# Patient Record
Sex: Female | Born: 1945
Health system: Southern US, Community
[De-identification: ages and names within clinical notes are randomized; demographics above are authoritative.]

## PROBLEM LIST (undated history)

## (undated) DIAGNOSIS — Z9289 Personal history of other medical treatment: Secondary | ICD-10-CM

## (undated) DIAGNOSIS — J189 Pneumonia, unspecified organism: Secondary | ICD-10-CM

## (undated) DIAGNOSIS — G43909 Migraine, unspecified, not intractable, without status migrainosus: Secondary | ICD-10-CM

## (undated) DIAGNOSIS — G8929 Other chronic pain: Secondary | ICD-10-CM

## (undated) DIAGNOSIS — F419 Anxiety disorder, unspecified: Secondary | ICD-10-CM

## (undated) DIAGNOSIS — K5900 Constipation, unspecified: Secondary | ICD-10-CM

## (undated) DIAGNOSIS — K219 Gastro-esophageal reflux disease without esophagitis: Secondary | ICD-10-CM

## (undated) HISTORY — PX: ABDOMINAL WALL MESH  REMOVAL: SHX1116

## (undated) HISTORY — PX: TONSILLECTOMY: SUR1361

## (undated) HISTORY — PX: CATARACT EXTRACTION W/ INTRAOCULAR LENS  IMPLANT, BILATERAL: SHX1307

## (undated) HISTORY — PX: OOPHORECTOMY: SHX86

## (undated) HISTORY — DX: Gastro-esophageal reflux disease without esophagitis: K21.9

## (undated) HISTORY — PX: COLONOSCOPY: SHX174

## (undated) HISTORY — PX: BLADDER SURGERY: SHX569

## (undated) HISTORY — PX: APPENDECTOMY: SHX54

## (undated) HISTORY — PX: SHOULDER SURGERY: SHX246

---

## 1998-08-02 ENCOUNTER — Emergency Department (HOSPITAL_COMMUNITY): Admission: EM | Admit: 1998-08-02 | Discharge: 1998-08-02 | Payer: Self-pay | Admitting: Emergency Medicine

## 1998-12-07 ENCOUNTER — Other Ambulatory Visit: Admission: RE | Admit: 1998-12-07 | Discharge: 1998-12-07 | Payer: Self-pay | Admitting: Obstetrics and Gynecology

## 1999-10-06 ENCOUNTER — Encounter (INDEPENDENT_AMBULATORY_CARE_PROVIDER_SITE_OTHER): Payer: Self-pay | Admitting: Specialist

## 1999-10-06 ENCOUNTER — Ambulatory Visit (HOSPITAL_COMMUNITY): Admission: RE | Admit: 1999-10-06 | Discharge: 1999-10-06 | Payer: Self-pay | Admitting: Gastroenterology

## 1999-12-07 ENCOUNTER — Emergency Department (HOSPITAL_COMMUNITY): Admission: EM | Admit: 1999-12-07 | Discharge: 1999-12-07 | Payer: Self-pay | Admitting: Emergency Medicine

## 1999-12-09 ENCOUNTER — Emergency Department (HOSPITAL_COMMUNITY): Admission: EM | Admit: 1999-12-09 | Discharge: 1999-12-09 | Payer: Self-pay | Admitting: Emergency Medicine

## 2000-01-30 ENCOUNTER — Other Ambulatory Visit: Admission: RE | Admit: 2000-01-30 | Discharge: 2000-01-30 | Payer: Self-pay | Admitting: Obstetrics and Gynecology

## 2001-05-05 ENCOUNTER — Other Ambulatory Visit: Admission: RE | Admit: 2001-05-05 | Discharge: 2001-05-05 | Payer: Self-pay | Admitting: Obstetrics and Gynecology

## 2002-09-07 ENCOUNTER — Other Ambulatory Visit: Admission: RE | Admit: 2002-09-07 | Discharge: 2002-09-07 | Payer: Self-pay | Admitting: Obstetrics and Gynecology

## 2003-12-14 ENCOUNTER — Other Ambulatory Visit: Admission: RE | Admit: 2003-12-14 | Discharge: 2003-12-14 | Payer: Self-pay | Admitting: Obstetrics and Gynecology

## 2005-04-20 ENCOUNTER — Ambulatory Visit (HOSPITAL_BASED_OUTPATIENT_CLINIC_OR_DEPARTMENT_OTHER): Admission: RE | Admit: 2005-04-20 | Discharge: 2005-04-20 | Payer: Self-pay | Admitting: Orthopedic Surgery

## 2005-04-20 ENCOUNTER — Ambulatory Visit (HOSPITAL_COMMUNITY): Admission: RE | Admit: 2005-04-20 | Discharge: 2005-04-20 | Payer: Self-pay | Admitting: Orthopedic Surgery

## 2008-05-19 ENCOUNTER — Ambulatory Visit (HOSPITAL_BASED_OUTPATIENT_CLINIC_OR_DEPARTMENT_OTHER): Admission: RE | Admit: 2008-05-19 | Discharge: 2008-05-19 | Payer: Self-pay | Admitting: Orthopedic Surgery

## 2009-04-02 ENCOUNTER — Inpatient Hospital Stay (HOSPITAL_COMMUNITY): Admission: EM | Admit: 2009-04-02 | Discharge: 2009-04-05 | Payer: Self-pay | Admitting: Emergency Medicine

## 2009-05-26 ENCOUNTER — Other Ambulatory Visit: Admission: RE | Admit: 2009-05-26 | Discharge: 2009-05-26 | Payer: Self-pay | Admitting: Family Medicine

## 2010-02-28 ENCOUNTER — Ambulatory Visit (HOSPITAL_COMMUNITY)
Admission: RE | Admit: 2010-02-28 | Discharge: 2010-03-01 | Payer: Self-pay | Source: Home / Self Care | Admitting: Obstetrics and Gynecology

## 2010-02-28 ENCOUNTER — Encounter (INDEPENDENT_AMBULATORY_CARE_PROVIDER_SITE_OTHER): Payer: Self-pay | Admitting: Obstetrics and Gynecology

## 2010-05-29 ENCOUNTER — Encounter: Admission: RE | Admit: 2010-05-29 | Discharge: 2010-05-29 | Payer: Self-pay | Admitting: Family Medicine

## 2010-07-19 LAB — CBC
HCT: 41.1 % (ref 36.0–46.0)
Hemoglobin: 13.1 g/dL (ref 12.0–15.0)
MCH: 28.7 pg (ref 26.0–34.0)
MCHC: 31.9 g/dL (ref 30.0–36.0)
MCV: 90.1 fL (ref 78.0–100.0)
Platelets: 167 10*3/uL (ref 150–400)
RBC: 4.56 MIL/uL (ref 3.87–5.11)
RDW: 15.3 % (ref 11.5–15.5)
WBC: 5.2 10*3/uL (ref 4.0–10.5)

## 2010-07-25 ENCOUNTER — Ambulatory Visit (HOSPITAL_COMMUNITY)
Admission: RE | Admit: 2010-07-25 | Discharge: 2010-07-26 | Payer: Self-pay | Source: Home / Self Care | Attending: Obstetrics and Gynecology | Admitting: Obstetrics and Gynecology

## 2010-07-31 ENCOUNTER — Ambulatory Visit (HOSPITAL_COMMUNITY)
Admission: RE | Admit: 2010-07-31 | Discharge: 2010-07-31 | Payer: Self-pay | Source: Home / Self Care | Attending: Obstetrics and Gynecology | Admitting: Obstetrics and Gynecology

## 2010-07-31 LAB — CBC
HCT: 31.2 % — ABNORMAL LOW (ref 36.0–46.0)
HCT: 22.2 % — ABNORMAL LOW (ref 36.0–46.0)
HCT: 21.6 % — ABNORMAL LOW (ref 36.0–46.0)
Hemoglobin: 10 g/dL — ABNORMAL LOW (ref 12.0–15.0)
Hemoglobin: 7.2 g/dL — ABNORMAL LOW (ref 12.0–15.0)
Hemoglobin: 7.1 g/dL — ABNORMAL LOW (ref 12.0–15.0)
MCH: 29.1 pg (ref 26.0–34.0)
MCH: 29 pg (ref 26.0–34.0)
MCH: 29.6 pg (ref 26.0–34.0)
MCHC: 32.1 g/dL (ref 30.0–36.0)
MCHC: 32.4 g/dL (ref 30.0–36.0)
MCHC: 32.9 g/dL (ref 30.0–36.0)
MCV: 90.7 fL (ref 78.0–100.0)
MCV: 89.5 fL (ref 78.0–100.0)
MCV: 90 fL (ref 78.0–100.0)
Platelets: 152 10*3/uL (ref 150–400)
Platelets: 147 10*3/uL — ABNORMAL LOW (ref 150–400)
Platelets: 150 10*3/uL (ref 150–400)
RBC: 3.44 MIL/uL — ABNORMAL LOW (ref 3.87–5.11)
RBC: 2.48 MIL/uL — ABNORMAL LOW (ref 3.87–5.11)
RBC: 2.4 MIL/uL — ABNORMAL LOW (ref 3.87–5.11)
RDW: 14.7 % (ref 11.5–15.5)
RDW: 14.8 % (ref 11.5–15.5)
RDW: 14.7 % (ref 11.5–15.5)
WBC: 12.3 10*3/uL — ABNORMAL HIGH (ref 4.0–10.5)
WBC: 7.3 10*3/uL (ref 4.0–10.5)
WBC: 8.8 10*3/uL (ref 4.0–10.5)

## 2010-08-02 LAB — CREATININE, SERUM
Creatinine, Ser: 0.72 mg/dL (ref 0.4–1.2)
GFR calc Af Amer: >60 mL/min (ref >60)
GFR calc non Af Amer: >60 mL/min (ref >60)

## 2010-08-02 LAB — BLOOD GAS, ARTERIAL
Acid-base deficit: 2.8 mmol/L — ABNORMAL HIGH (ref 0.0–2.0)
Bicarbonate: 21.4 mEq/L (ref 20.0–24.0)
Drawn by: 136
FIO2: 0.21 %
TCO2: 22.6 mmol/L (ref 0–100)
pCO2 arterial: 37.7 mmHg (ref 35.0–45.0)
pH, Arterial: 7.373 (ref 7.350–7.400)
pO2, Arterial: 60.9 mmHg — ABNORMAL LOW (ref 80.0–100.0)

## 2010-08-02 LAB — BUN: BUN: 12 mg/dL (ref 6–23)

## 2010-08-03 ENCOUNTER — Inpatient Hospital Stay (HOSPITAL_COMMUNITY)
Admission: AD | Admit: 2010-08-03 | Discharge: 2010-08-03 | Payer: Self-pay | Source: Home / Self Care | Attending: Obstetrics and Gynecology | Admitting: Obstetrics and Gynecology

## 2010-08-10 LAB — SURGICAL PCR SCREEN
MRSA, PCR: NEGATIVE
Staphylococcus aureus: POSITIVE — AB

## 2010-08-18 NOTE — H&P (Signed)
[name], [name]                ACCOUNT NO.:  [id number]  MEDICAL RECORD NO.:  [id number]          PATIENT TYPE:  OBV  LOCATION:  9310                          FACILITY:  [facility]  PHYSICIAN:  Juluis Mire, M.D.   DATE OF BIRTH:  Apr 10, 1946  DATE OF ADMISSION:  07/25/2010 DATE OF DISCHARGE:                             HISTORY & PHYSICAL   HISTORY OF PRESENT ILLNESS:  The patient is a 65 year old postmenopausal patient.  The patient presents for evaluation of pelvic pain and discomfort after previous surgery.  We are going to do laparoscopy to rule out pelvic adhesions.  We are also going to cut the mesh arms from the uphold system and inject the area with Marcaine and dexamethasone.  In relation to the present admission, the patient underwent LAVH with a left salpingo-oophorectomy, anterior repair using the Uphold system, posterior repair and mid urethral sling in August of this year.  She has had problems with worsening pelvic pain, discomfort, and dyspareunia and has been undergoing pelvic rehab with physical therapist also had GI evaluation, all of which were negative.  She has continued to have issues and is becoming progressive on evaluation.  She does have evidence of tenderness at both sacrospinous areas as well as the vaginal cuff.  Therefore, we are going to proceed with the above-noted surgery.  ALLERGIES:  In terms of allergies, she is allergic to SULFA DRUGS.  MEDICATIONS:  Imipramine, Prilosec, and trazodone.  PAST MEDICAL HISTORY:  Usual childhood disease without any significant sequelae.  Does have a history of esophageal reflux disorder.  PAST SURGICAL HISTORY:  The patient has had previous tonsillectomy and adenoidectomy in 1949.  In 1985, she had bilateral tubal ligation.  In 1988, she had laparoscopy with lysis of adhesions.  In 1989, she had an appendectomy with removal of right tube and ovary due to adhesions and then she underwent the above-noted surgery.   She has had one vaginal delivery.  FAMILY HISTORY:  Noncontributory.  SOCIAL HISTORY:  Reveals no tobacco or alcohol use.  REVIEW OF SYSTEMS:  Noncontributory.  PHYSICAL EXAMINATION:  VITAL SIGNS:  The patient is afebrile, stable vital signs. HEENT:  The patient is normocephalic.  Pupils equal and react to light accommodation.  Extraocular movements are intact.  Sclerae and conjunctivae are clear.  Oropharynx clear. NECK:  Without thyromegaly. BREASTS:  Not examined. LUNGS:  Clear. CARDIOVASCULAR:  Regular rate.  No murmurs or gallops. ABDOMEN:  Reveal diffuse suprapubic tenderness.  No mass, organomegaly, or tenderness. PELVIC:  Normal external genitalia.  Vaginal mucosa is clear.  Cuff is intact.  She has good support, no evidence of mesh or erosions, does have cuff tenderness at the top and on both sides next to the sacrospinous ligaments.  The arms of the Uphold system feel a bit tight. EXTREMITIES:  Trace edema. NEUROLOGICAL:  Grossly within limits.  IMPRESSION:  Continued pelvic pain and discomfort with associated dyspareunia, possibly secondary to mesh system.  PLAN OF MANAGEMENT:  We are ago with a laparoscope to rule out adhesions.  We are going to clip both arms of the Uphold to relieve the  tension.  We will inject the area with dexamethasone and Marcaine to see if we give her relief from her discomfort.  The nature of procedure and risks have been discussed including the potential risk of continued pain, discomfort, dyspareunia, the risk of infection.  Risk of hemorrhage that could require transfusion, risk of AIDS, or hepatitis. Risk of injury to adjacent organs requiring further exploratory surgery. Risk of deep venous thrombosis and pulmonary embolus.  The patient does understand potential risks and complications.     Juluis Mire, M.D.     JSM/MEDQ  D:  07/25/2010  T:  07/25/2010  Job:  [id number]  Electronically Signed by Richardean Chimera M.D. on  08/18/2010 01:34:35 PM

## 2010-08-18 NOTE — Discharge Summary (Signed)
  [name], [name]                ACCOUNT NO.:  [id number]  MEDICAL RECORD NO.:  [id number]          PATIENT TYPE:  OIB  LOCATION:  9310                          FACILITY:  [facility]  PHYSICIAN:  Juluis Mire, M.D.   DATE OF BIRTH:  [birth year]  DATE OF ADMISSION:  07/25/2010 DATE OF DISCHARGE:  07/26/2010                              DISCHARGE SUMMARY   ADMITTING DIAGNOSIS:  Pelvic pain secondary to paravaginal adhesions with associated dyspareunia.  POSTOPERATIVE DIAGNOSIS:  Pelvic pain secondary to paravaginal adhesions with associated dyspareunia.  OPERATIVE PROCEDURE:  Open laparoscopy.  Release of vaginal scarring. Division of the uphold arm on the right side.  Injection of the vaginal cuff as well as the sacrospinous ligaments with dissolution of dexamethasone and Marcaine.  For complete history and physical, see dictated note, course in the hospital, please see dictated operative note.  We did have quite a bit of blood loss due to pain.  The patient was watched overnight.  Her hemoglobin the next one was 7.2.  We repeated that afternoon, it was 7.1.  She was stable, ambulating without difficulty, tolerating her diet.  She was voiding without difficulty, had minimal bleeding at that time and her exam was benign.  She will be discharged to home.  In terms of complication, noted above.  The patient discharged to home in stable condition.  DISPOSITION:  The patient avoid heavy lifting, vaginal inserts, or driving a car.  She is to call with signs of infection, nausea, vomiting, active vaginal bleeding or increasing pain.  Also instructed if signs and symptoms of deep venous thrombosis and pulmonary embolus. Discharged on Tylox as needed for pain and iron sulfate supplementation. Reassess in the office early next week.     Juluis Mire, M.D.     JSM/MEDQ  D:  07/26/2010  T:  07/27/2010  Job:  [id number]  Electronically Signed by Richardean Chimera M.D. on 08/18/2010  01:34:33 PM

## 2010-08-18 NOTE — Op Note (Signed)
[name], [name]                ACCOUNT NO.:  [id number]  MEDICAL RECORD NO.:  [id number]          PATIENT TYPE:  OBV  LOCATION:  9310                          FACILITY:  [facility]  PHYSICIAN:  Juluis Mire, M.D.   DATE OF BIRTH:  Apr 04, 1946  DATE OF PROCEDURE:  07/25/2010 DATE OF DISCHARGE:                              OPERATIVE REPORT   PREOPERATIVE DIAGNOSIS:  Pelvic pain, felt to be secondary to pelvic adhesions or scarring around the uphold mesh system.  POSTOPERATIVE DIAGNOSIS:  Pelvic pain, felt to be secondary to pelvic adhesions or scarring around the uphold mesh system.  OPERATIVE PROCEDURE:  Open laparoscopy.  We then released the right arm of the uphold mesh system.  Subsequently injected the vaginal cuff with a combination of Marcaine with dexamethasone and injected both sacrospinous ligaments with the same solution.  Also cystoscopy.  SURGEON:  Juluis Mire, M.D.  ANESTHESIA:  General endotracheal.  ESTIMATED BLOOD LOSS:  800 mL to 1000 mL.  PACKS AND DRAINS:  None.  INJECTABLES PLACED:  None.  COMPLICATIONS:  None.  INDICATIONS:  Dictated history and physical.  PROCEDURE:  The patient was taken to OR and placed in supine position. After satisfactory level of general endotracheal anesthesia was obtained, the patient was placed in the dorsal lithotomy position using the Allen stirrups.  At this point in time, the abdomen, perineum, and vagina prepped out with Betadine.  Foley was placed to straight drain. The patient was then draped in sterile field.  A subumbilical incision made with a knife and extended through subcutaneous tissue.  Fascia identified, entered sharply, and incision fashioned laterally. Peritoneum was identified, entered with blunt finger pressure.  The open laparoscopic trocar was put in place and secured.  The abdomen was insufflated with carbon dioxide.  Laparoscope was introduced.  There was no evidence of injury to adjacent organs.   A 5-mm trocar was put in place in suprapubic area.  Visualization revealed the cuff to be completely clear.  There was no adhesions from the bowel or anything to the pelvic cuff or sidewalls.  At this point in time, the laparoscope was removed, abdomen was deflated with carbon dioxide.  The patient's legs were repositioned.  We decided to go vaginally.  Vaginally revealed tight bands on each side that felt to represent the arms of the uphold system.  Also, there was a tight introitus.  We were able to manually reduce the constriction at the introitus.  Internally, we first went to the patient's right side assumed to be the tightest.  Using Kochers, we were able to grab the band.  Using a combination of knife and scissors and dissection, we were eventually able to clip the band and tube, which probably represents some of the mesh, although we never could identify the mesh specifically.  We then ran into some brisk bleeding coming from that side.  Dr. Henderson Cloud was called in.  We were able to eventually identify the bleeders.  Using sutures of 2-0 Vicryl in a figure-of-eight fashion, we were able to close the vaginal defect that we had developed in order to take  down the band and this did bring about hemostasis.  She lost probably a total between [phone] mL before able to obtain hemostasis.  We closely visualized the area and it was hemostatically intact.  At this point in time using a pudendal kit, we injected a combination of Marcaine and dexamethasone into the sacrospinous ligaments on both sides.  Also, we were able to inject at the top of the vaginal cuff and we infiltrate also under the bladder area.  The introitus, the skin had separated aside from the vaginal area where we had manually reduce the scarring.  We resutured that with interrupted sutures of 3-0 Rapide.  The patient was then given indigo carmine.  Cystoscopy was performed. There was no evidence of injury to the bladder.   Blue streams of urine were noted to be coming from both ureteral orifices.  The cystoscope was then removed.  Bladder was re-emptied.  We went ahead and re-visualized the vaginal cuff, it was intact.  On rectal exam, we could not feel any mass forming, there was no holes in the colon.  We went back laparoscopically, visualized at all no bleeding or developing hematoma.  The abdomen was deflated with carbon dioxide.  All trocars were removed.  Subumbilical fascia closed with figure-of-eight of 0 Vicryl.  Skin was closed with interrupted subcuticular of  4-0 Vicryl and the suprapubic incision was closed with Dermabond.  We went back vaginally, re-visualized the vaginal cuff.  There was no active bleeding.  Rectal exam again was unremarkable.  There was no hematoma formation that we could detect.  The patient was hemodynamically stable.  At this point in time, the patient was taken out of the dorsal supine position.  Once alert and extubated, transferred to recovery room in good condition.  Sponge, instrument, and needle count reported as correct by circulating nurse x2.     Juluis Mire, M.D.     JSM/MEDQ  D:  07/25/2010  T:  07/26/2010  Job:  [id number]  Electronically Signed by Richardean Chimera M.D. on 08/18/2010 01:34:37 PM

## 2010-09-06 ENCOUNTER — Ambulatory Visit: Payer: Self-pay | Admitting: Cardiology

## 2010-09-29 LAB — COMPREHENSIVE METABOLIC PANEL
ALT: 24 U/L (ref 0–35)
AST: 30 U/L (ref 0–37)
Albumin: 4.7 g/dL (ref 3.5–5.2)
Alkaline Phosphatase: 95 U/L (ref 39–117)
BUN: 7 mg/dL (ref 6–23)
CO2: 27 mEq/L (ref 19–32)
Calcium: 9.6 mg/dL (ref 8.4–10.5)
Chloride: 103 mEq/L (ref 96–112)
Creatinine, Ser: 0.74 mg/dL (ref 0.4–1.2)
GFR calc Af Amer: >60 mL/min (ref >60)
GFR calc non Af Amer: >60 mL/min (ref >60)
Glucose, Bld: 90 mg/dL (ref 70–99)
Potassium: 3.9 mEq/L (ref 3.5–5.1)
Sodium: 135 mEq/L (ref 135–145)
Total Bilirubin: 0.5 mg/dL (ref 0.3–1.2)
Total Protein: 7.8 g/dL (ref 6.0–8.3)

## 2010-09-29 LAB — CBC
HCT: 29.9 % — ABNORMAL LOW (ref 36.0–46.0)
HCT: 40.7 % (ref 36.0–46.0)
Hemoglobin: 10.2 g/dL — ABNORMAL LOW (ref 12.0–15.0)
Hemoglobin: 13.7 g/dL (ref 12.0–15.0)
MCH: 31.6 pg (ref 26.0–34.0)
MCH: 31.3 pg (ref 26.0–34.0)
MCHC: 34 g/dL (ref 30.0–36.0)
MCHC: 33.6 g/dL (ref 30.0–36.0)
MCV: 93 fL (ref 78.0–100.0)
MCV: 93.2 fL (ref 78.0–100.0)
Platelets: 176 10*3/uL (ref 150–400)
Platelets: 215 10*3/uL (ref 150–400)
RBC: 3.22 MIL/uL — ABNORMAL LOW (ref 3.87–5.11)
RBC: 4.37 MIL/uL (ref 3.87–5.11)
RDW: 13.5 % (ref 11.5–15.5)
RDW: 13.1 % (ref 11.5–15.5)
WBC: 10.1 10*3/uL (ref 4.0–10.5)
WBC: 6.7 10*3/uL (ref 4.0–10.5)

## 2010-09-29 LAB — SURGICAL PCR SCREEN
MRSA, PCR: NEGATIVE
Staphylococcus aureus: POSITIVE — AB

## 2010-10-20 LAB — CBC
HCT: 39.3 % (ref 36.0–46.0)
HCT: 39.5 % (ref 36.0–46.0)
Hemoglobin: 13.3 g/dL (ref 12.0–15.0)
Hemoglobin: 13.5 g/dL (ref 12.0–15.0)
MCHC: 33.8 g/dL (ref 30.0–36.0)
MCHC: 34.2 g/dL (ref 30.0–36.0)
MCV: 91.1 fL (ref 78.0–100.0)
MCV: 91.4 fL (ref 78.0–100.0)
Platelets: 180 10*3/uL (ref 150–400)
Platelets: 198 10*3/uL (ref 150–400)
RBC: 4.3 MIL/uL (ref 3.87–5.11)
RBC: 4.33 MIL/uL (ref 3.87–5.11)
RDW: 14.5 % (ref 11.5–15.5)
RDW: 14.6 % (ref 11.5–15.5)
WBC: 7 10*3/uL (ref 4.0–10.5)
WBC: 8.3 10*3/uL (ref 4.0–10.5)

## 2010-10-20 LAB — COMPREHENSIVE METABOLIC PANEL
ALT: 24 U/L (ref 0–35)
AST: 36 U/L (ref 0–37)
Albumin: 4.3 g/dL (ref 3.5–5.2)
Alkaline Phosphatase: 114 U/L (ref 39–117)
BUN: 5 mg/dL — ABNORMAL LOW (ref 6–23)
CO2: 24 mEq/L (ref 19–32)
Calcium: 9.7 mg/dL (ref 8.4–10.5)
Chloride: 106 mEq/L (ref 96–112)
Creatinine, Ser: 0.7 mg/dL (ref 0.4–1.2)
GFR calc Af Amer: >60 mL/min (ref >60)
GFR calc non Af Amer: >60 mL/min (ref >60)
Glucose, Bld: 88 mg/dL (ref 70–99)
Potassium: 3.2 mEq/L — ABNORMAL LOW (ref 3.5–5.1)
Sodium: 139 mEq/L (ref 135–145)
Total Bilirubin: 0.7 mg/dL (ref 0.3–1.2)
Total Protein: 7.2 g/dL (ref 6.0–8.3)

## 2010-10-20 LAB — RAPID URINE DRUG SCREEN, HOSP PERFORMED
Amphetamines: NOT DETECTED
Barbiturates: NOT DETECTED
Benzodiazepines: POSITIVE — AB
Cocaine: NOT DETECTED
Opiates: POSITIVE — AB
Tetrahydrocannabinol: NOT DETECTED

## 2010-10-20 LAB — URINALYSIS, ROUTINE W REFLEX MICROSCOPIC
Bilirubin Urine: NEGATIVE
Glucose, UA: NEGATIVE mg/dL
Hgb urine dipstick: NEGATIVE
Ketones, ur: NEGATIVE mg/dL
Nitrite: NEGATIVE
Protein, ur: NEGATIVE mg/dL
Specific Gravity, Urine: 1.007 (ref 1.005–1.030)
Urobilinogen, UA: 0.2 mg/dL (ref 0.0–1.0)
pH: 5.5 (ref 5.0–8.0)

## 2010-10-20 LAB — BASIC METABOLIC PANEL
BUN: 2 mg/dL — ABNORMAL LOW (ref 6–23)
Chloride: 108 mEq/L (ref 96–112)
Potassium: 4 mEq/L (ref 3.5–5.1)

## 2010-10-20 LAB — LIPASE, BLOOD: Lipase: 23 U/L (ref 11–59)

## 2010-10-20 LAB — GLUCOSE, CAPILLARY: Glucose-Capillary: 88 mg/dL (ref 70–99)

## 2010-10-20 LAB — SYPHILIS: RPR W/REFLEX TO RPR TITER AND TREPONEMAL ANTIBODIES, TRADITIONAL SCREENING AND DIAGNOSIS ALGORITHM: RPR Ser Ql: NONREACTIVE

## 2010-10-20 LAB — CALCIUM: Calcium: 9.2 mg/dL (ref 8.4–10.5)

## 2010-10-20 LAB — DIFFERENTIAL
Basophils Absolute: 0 10*3/uL (ref 0.0–0.1)
Eosinophils Relative: 1 % (ref 0–5)
Lymphocytes Relative: 28 % (ref 12–46)
Neutro Abs: 4.5 10*3/uL (ref 1.7–7.7)

## 2010-10-20 LAB — T4, FREE: Free T4: 0.98 ng/dL (ref 0.80–1.80)

## 2010-10-20 LAB — MAGNESIUM: Magnesium: 2 mg/dL (ref 1.5–2.5)

## 2010-10-20 LAB — VITAMIN B12: Vitamin B-12: 499 pg/mL (ref 211–911)

## 2010-10-20 LAB — PHOSPHORUS: Phosphorus: 2.3 mg/dL (ref 2.3–4.6)

## 2010-10-20 LAB — URINE MICROSCOPIC-ADD ON

## 2010-10-20 LAB — ETHANOL: Alcohol, Ethyl (B): <5 mg/dL (ref 0–10)

## 2010-11-28 NOTE — Op Note (Signed)
[name], [name]                ACCOUNT NO.:  [id number]   MEDICAL RECORD NO.:  [id number]          PATIENT TYPE:  AMB   LOCATION:  DSC                          FACILITY:  [facility]   PHYSICIAN:  Harvie Junior, M.D.   DATE OF BIRTH:  August 30, 1945   DATE OF PROCEDURE:  05/19/2008  DATE OF DISCHARGE:                               OPERATIVE REPORT   PREOPERATIVE DIAGNOSIS:  Persistent shoulder pain in the left, status  post acromioplasty, distal clavicle resection with radiographic findings  of regrowth of the distal clavicle and persistent impingement.   POSTOPERATIVE DIAGNOSIS:  Persistent shoulder pain in the left, status  post acromioplasty, distal clavicle resection with radiographic findings  of regrowth of the distal clavicle and persistent impingement.   PRINCIPAL PROCEDURE:  1. Arthroscopic subacromial decompression with tension laterally.  2. Debridement of the subacromial space as well as the glenohumeral      joint arthroscopic.  3. Open distal clavicle resection.   SURGEON:  Harvie Junior, M.D.   ASSISTANT:  Marshia Ly, P.A.   ANESTHESIA:  General.   BRIEF HISTORY:  Ms. Hesser is a 65 year old female with long history of  having had left shoulder pain.  We treated conservatively for a period  of time, but ultimately she was taken to the operating room for  subacromial decompression and distal clavicle resection.  She did well  initially postoperatively, but then began having increasing pain.  She  had responded to injections in the area of the distal clavicle excision.  There was a small wisp of bone postoperatively, really not much, but  over the four-month postoperative this continued to be increase in size  and ultimately injection in this area seem to help.  She also had some  persistent lateral shoulder pain.  We had taken some x-rays of her neck  and had seen some spurring up in the neck and felt that this needed  evaluation with MRI, possibly EMG of the  upper extremity.  She is full  apprehensive about pursuing such an aggressive workup without  readjusting the shoulder as an initial treatment and so she was  ultimately taken to the operating room for open distal clavicle  resection and we had a look into the shoulder while we were there to  make sure there was nothing significant going on.  So, she was brought  to the operating room for that procedure.  We did have a long discussion  with her preoperatively and that her thought process was that she should  workup the neck at that time, but we did not think it was inappropriate  to go ahead and address the shoulder and if perhaps that relieved all of  her pain, then she would need to have the other workup.  My feeling is  that there is some percentage of each causing the pain and that this is  one way to sort out what the percentage is based on how much relief she  gets with the shoulder surgery.  She certainly had improvement with the  injection of numbing medicines, so we certainly  think this is the  appropriate course of action.  She was brought to the operating room for  this procedure.   PROCEDURE:  The patient was brought to the operating room.  After  adequate anesthesia was obtained with general anesthetic, the patient  was placed supine on the operating table.  The left shoulder was then  prepped and draped in the usual sterile fashion.  Following this, the  shoulder was examined under anesthesia and felt to be stable in all  direction.  At this point, the shoulder was prepped and draped in the  usual sterile fashion.  Routine arthroscopic examination which showed in  the glenohumeral joint there was some undersurface rotator cuff tear,  which was debrided with a suction shaver.  Nothing dramatic in this  area.  Attention was turned back to the subacromial space where a  subtotal bursectomy was performed.  There was a fair amount of  tenacious, aggressive scar tissue.  There  was some bands connecting the  under surface of the deltoid to the lateral rotator cuff and these were  taken down.  There were some issues relative to the lateral acromion and  so we went ahead and did a little bit of lateral acromioplasty and took  down thick bands that were attached to the lateral acromion.  This was  completed.  Attention turned over to the area of the distal clavicle and  certainly you could see the old, where the distal clavicle had been  performed, there was a little bit of hard area up in that area and this  was debrided minimally and then attention was turned out of the  arthroscopic portion of the case.  The shoulder was suctioned dry.  Attention was then turned to the open.  We made a small incision in  [employer], dissected down to the deltotrapezial fascia.  Opened this  longitudinally along the edge of the bone, took about 1 cm of the distal  clavicle and then all of the remaining scar and bone out of the  interval.  There was one large bony fragment, which could be seen on x-  ray, which was removed as well as a bunch of scar tissue.  We then  closed the deltotrapezial fascia after a thorough and copious  irrigation.  I then closed the skin.  Sterile compressive dressings were  applied.  The patient was taken to the recovery room and was noted to be  in a satisfactory condition.  Estimated blood loss for this procedure  was less than 25 mL.      Harvie Junior, M.D.  Electronically Signed     JLG/MEDQ  D:  05/19/2008  T:  05/20/2008  Job:  [id number]

## 2010-12-01 NOTE — Op Note (Signed)
NAMERAINI, TILEY                ACCOUNT NO.:  [id number]   MEDICAL RECORD NO.:  [id number]          PATIENT TYPE:  AMB   LOCATION:  DSC                          FACILITY:  [facility]   PHYSICIAN:  Harvie Junior, M.D.   DATE OF BIRTH:  Jan 26, 1946   DATE OF PROCEDURE:  04/20/2005  DATE OF DISCHARGE:                                 OPERATIVE REPORT   PREOPERATIVE DIAGNOSIS:  Medial femoral condylar pain as well as  patellofemoral problems with suspected medial plica.   POSTOPERATIVE DIAGNOSES:  1.  Osteochondral injury medial femoral condyle.  2.  Osteochondral injuries of patella.  3.  Large medial plica.   PRINCIPAL PROCEDURES:  1.  Debridement of chondromalacia medial femoral condyle.  2.  Debridement of chondromalacia of the patella.  3.  Debridement of medial side plica.   SURGEON:  Harvie Junior, M.D.   ASSISTANT:  Marshia Ly, P.A.   ANESTHESIA:  General anesthesia.   BRIEF HISTORY:  A 65 year old female with a long history of having a  significant twisting style injury.  She also was evaluated in the office and  felt to have significant injuries within the knee.  MRI was obtained which  showed some patellofemoral problems.  Injection therapy had helped but she  still persisted with significant medial side pain.  Therapy had helped but  she still persisted with significant medial side.  Ultimately she was taken  to the operating room for debridement of evaluation and debridement of these  problems.  Patient taken to the operating room.  After adequate anesthesia  obtained with general anesthetic, patient placed on the operating table.  Left leg was prepped and draped in the usual sterile fashion.  Following  this, routine arthroscopic examination of the knee revealed there as obvious  large medial shelf plica.  There was obvious grade III change of the  patella.  The patella was debrided back to a smooth and stable rim.  The  medial plica was debrided all the way back  to the capsular wall and the  attention was turned in the medial compartment where there was some grade II  change on the medial femoral condyle.  Posteriorly, there was a slightly  larger grade III lesion which appeared to have some evidence of healing.  ACL was evaluated and noted to be normal.  Lateral side normal.  Attention  was then turned back to the medial compartment where debridement was  undertaken from just north of the patella all the way down to the meniscus.  The entire medial wall was debrided of its plica and other scar related  tissue.  At this point, the knee was copiously  irrigated and suctioned dry.  The arthroscopic portals were closed with a  bandage.  A sterile compressive dressing was applied and the patient taken  to the recovery room and was noted to be in satisfactory condition.   ESTIMATED BLOOD LOSS:  None.      Harvie Junior, M.D.  Electronically Signed     JLG/MEDQ  D:  04/20/2005  T:  04/20/2005  Job:  [id number] 

## 2010-12-01 NOTE — Procedures (Signed)
[facility]  Patient:    [name], [name]                       MRN: [id number] Proc. Date: 10/06/99 Adm. Date:  [id number] Attending:  Rich Brave                           Procedure Report  DATE OF BIRTH:  [birth year]  PROCEDURE:  Upper endoscopy with biopsies.  ENDOSCOPIST:  Florencia Reasons, M.D.  ANESTHESIA:  INDICATIONS:  A 65 year old previously seen by another gastroenterologist who felt she had Barretts esophagus for surveillance.  She is maintained on Prevacid 30 g daily with good control of reflux symptoms.  FINDINGS:  Possible short segment Barretts esophagus versus irregular Z-line above small hiatal hernia.  DESCRIPTION OF PROCEDURE:  The patient provided written consent for the procedure. Sedation was fentanyl 75 mcg and Versed 8 mg without arrhythmias or desaturation. The Olympus small caliber adult video endoscope was passed under direct vision.  The vocal cords looked normal.  The esophagus was easily entered and had entirely normal mucosa without evidence of reflux esophagitis, varices, infection, or neoplasia.  There was a slightly irregular Z-line above a small 1-2 cm hiatal hernia, but I could not convince myself that there were any definite tongues of  Barretts mucosa.  Nonetheless, I obtained biopsies from the superior margin of he gastric-appearing mucosa at the conclusion of the procedure to rule out intestinal metaplasia.  The stomach was entered.  It was rather large and dilated, and retained air well, possibly correlating with the patients history of "gastroparesis."  No gastric mucosal abnormalities were seen and there was no retained food in the stomach.  There was no evidence of gastritis, erosions, ulcers, polyps, or masses.  The pylorus, duodenal bulb, and second duodenum looked normal.  The patient tolerated the procedure well and there were no  apparent complications.  IMPRESSION:  Small hiatal hernia, otherwise essentially normal exam.  No definite Barretts esophagus present.  PLAN:  Await pathology on the biopsies.  If no intestinal metaplasia is present, the patient could probably do without further surveillance endoscopies for Barretts.  She will probably need ongoing therapy to control her reflux symptoms, however. DD:  10/06/99 TD:  10/06/99 Job: [id number]

## 2011-01-30 ENCOUNTER — Encounter: Payer: Self-pay | Admitting: Cardiology

## 2011-01-31 ENCOUNTER — Ambulatory Visit (HOSPITAL_COMMUNITY): Admission: RE | Admit: 2011-01-31 | Payer: Medicare Other | Source: Ambulatory Visit

## 2011-01-31 ENCOUNTER — Other Ambulatory Visit (HOSPITAL_COMMUNITY): Payer: Self-pay | Admitting: Cardiology

## 2011-01-31 ENCOUNTER — Ambulatory Visit (INDEPENDENT_AMBULATORY_CARE_PROVIDER_SITE_OTHER): Payer: Medicare Other | Admitting: Cardiology

## 2011-01-31 ENCOUNTER — Encounter: Payer: Self-pay | Admitting: Cardiology

## 2011-01-31 VITALS — BP 126/76 | HR 90 | Resp 16 | Ht 63.0 in | Wt 120.0 lb

## 2011-01-31 DIAGNOSIS — I2510 Atherosclerotic heart disease of native coronary artery without angina pectoris: Secondary | ICD-10-CM

## 2011-01-31 LAB — BASIC METABOLIC PANEL
CO2: 23 mEq/L (ref 19–32)
Chloride: 103 mEq/L (ref 96–112)
Glucose, Bld: 90 mg/dL (ref 70–99)
Potassium: 4.7 mEq/L (ref 3.5–5.1)
Sodium: 140 mEq/L (ref 135–145)

## 2011-01-31 NOTE — Patient Instructions (Signed)
Please continue your current medications as listed. Your physician has requested that you have cardiac CT-Angiogram. Cardiac computed tomography (CT) is a painless test that uses an x-ray machine to take clear, detailed pictures of your heart. For further information please visit [email or web address] Please follow instruction sheet as given. Please return fasting for a lipid profile.

## 2011-01-31 NOTE — Assessment & Plan Note (Signed)
The patient clearly does have some plaquing she has calcium. I would not strongly suspect obstructive disease. Screening her with coronary CT angiography would be an excellent test. I will also pursue aggressive primary risk reduction. Toward that end I will have her come back for a fasting lipid profile. I would suggest a goal LDL less than 100 and HDL greater than 40. Of note CT can also evaluate for any effusion which I would doubt a significant period

## 2011-01-31 NOTE — Progress Notes (Signed)
HPI The patient presents for evaluation of coronary calcification. This was noted on a CT which was done to rule out pulmonary embolism. There was also a mention of trace pericardial effusion. The patient had had a recent GYN surgery and was apparently being evaluated for postoperative symptoms. She has not had any cardiac history. She's never had any other cardiac testing. Prior to having GYN problems she was exercising routinely and she still does household chores. The patient denies any new symptoms such as chest discomfort, neck or arm discomfort. There has been no new shortness of breath, PND or orthopnea. There have been no reported palpitations, presyncope or syncope.  Allergies  Allergen Reactions  . Sulfa Antibiotics     Current Outpatient Prescriptions  Medication Sig Dispense Refill  . HYDROcodone-acetaminophen (VICODIN) 5-500 MG per tablet Take 1 tablet by mouth every 6 (six) hours as needed.        . NON FORMULARY Impramine  100mg  2 po daily       . omeprazole (PRILOSEC) 20 MG capsule Take 20 mg by mouth daily.        . TRAZODONE HCL PO Take by mouth as needed.          Past Medical History  Diagnosis Date  . GERD (gastroesophageal reflux disease)     Past Surgical History  Procedure Date  . Appendectomy   . Bladder surgery     Mesh implanted  . Shoulder surgery     X 2  . Tonsillectomy     Family History  Problem Relation Age of Onset  . Coronary artery disease Father 74  . Coronary artery disease Mother 70    History   Social History  . Marital Status: Married    Spouse Name: N/A    Number of Children: 1  . Years of Education: N/A   Occupational History  . Retired    Social History Main Topics  . Smoking status: Never Smoker   . Smokeless tobacco: Not on file  . Alcohol Use: Not on file  . Drug Use: Not on file  . Sexually Active: Not on file   Other Topics Concern  . Not on file   Social History Narrative  . No narrative on file    ROS:   As stated in the HPI and negative for all other systems.   PHYSICAL EXAM BP 126/76  Pulse 90  Resp 16  Ht 5\' 3"  (1.6 m)  Wt 120 lb (54.432 kg)  BMI 21.26 kg/m2 GENERAL:  Well appearing HEENT:  Pupils equal round and reactive, fundi not visualized, oral mucosa unremarkable NECK:  No jugular venous distention, waveform within normal limits, carotid upstroke brisk and symmetric, no bruits, no thyromegaly LYMPHATICS:  No cervical, inguinal adenopathy LUNGS:  Clear to auscultation bilaterally BACK:  No CVA tenderness CHEST:  Unremarkable HEART:  PMI not displaced or sustained,S1 and S2 within normal limits, no S3, no S4, no clicks, no rubs, no murmurs ABD:  Flat, positive bowel sounds normal in frequency in pitch, no bruits, no rebound, no guarding, no midline pulsatile mass, no hepatomegaly, no splenomegaly EXT:  2 plus pulses throughout, no edema, no cyanosis no clubbing SKIN:  No rashes no nodules NEURO:  Cranial nerves II through XII grossly intact, motor grossly intact throughout PSYCH:  Cognitively intact, oriented to person place and time   EKG:  Sinus rhythm, rate 90, axis within normal limits, intervals within normal limits, no acute ST-T wave changes.  ASSESSMENT AND PLAN

## 2011-02-06 ENCOUNTER — Ambulatory Visit (HOSPITAL_COMMUNITY): Admission: RE | Admit: 2011-02-06 | Payer: Medicare Other | Source: Ambulatory Visit

## 2011-02-06 ENCOUNTER — Encounter (HOSPITAL_COMMUNITY): Payer: Self-pay

## 2011-02-06 ENCOUNTER — Other Ambulatory Visit: Payer: Self-pay | Admitting: Cardiovascular Disease

## 2011-02-06 ENCOUNTER — Other Ambulatory Visit: Payer: Self-pay | Admitting: Cardiology

## 2011-02-06 ENCOUNTER — Ambulatory Visit (HOSPITAL_COMMUNITY)
Admission: RE | Admit: 2011-02-06 | Discharge: 2011-02-06 | Disposition: A | Discharge status: Home / Self Care | Payer: Medicare Other | Source: Ambulatory Visit | Attending: Cardiovascular Disease | Admitting: Cardiovascular Disease

## 2011-02-06 DIAGNOSIS — I2510 Atherosclerotic heart disease of native coronary artery without angina pectoris: Secondary | ICD-10-CM | POA: Insufficient documentation

## 2011-02-06 DIAGNOSIS — R9389 Abnormal findings on diagnostic imaging of other specified body structures: Secondary | ICD-10-CM

## 2011-02-06 DIAGNOSIS — R9430 Abnormal result of cardiovascular function study, unspecified: Secondary | ICD-10-CM

## 2011-02-06 MED ORDER — IOHEXOL 350 MG/ML SOLN: 80.0000 mL | Freq: Once | INTRAVENOUS | Status: AC | PRN

## 2011-02-15 ENCOUNTER — Telehealth: Payer: Self-pay | Admitting: *Deleted

## 2011-02-15 NOTE — Telephone Encounter (Signed)
Left message for pt to call to discuss results as Dr Antoine Poche requested.

## 2011-02-15 NOTE — Telephone Encounter (Signed)
Returning call back to nurse.  

## 2011-02-15 NOTE — Telephone Encounter (Signed)
Message copied by Sharin Grave on Thu Feb 15, 2011 11:53 AM ------      Message from: Rollene Rotunda      Created: Thu Feb 08, 2011 11:37 PM       Will you call the patient.  I think Cindee Lame did call her already but please make sure.  Some calcium but no obstructive disease on CT.  I don't see a lipid.  Looks like it was ordered.  Tell her I will get back with her about this.  I should probably see her again in six months.

## 2011-02-16 NOTE — Telephone Encounter (Signed)
Lm to cb.

## 2011-03-02 NOTE — Telephone Encounter (Signed)
Pt never returned call again

## 2011-03-05 ENCOUNTER — Encounter (HOSPITAL_COMMUNITY): Payer: Self-pay

## 2011-03-05 ENCOUNTER — Emergency Department (HOSPITAL_COMMUNITY): Payer: Medicare Other

## 2011-03-05 ENCOUNTER — Emergency Department (HOSPITAL_COMMUNITY)
Admission: EM | Admit: 2011-03-05 | Discharge: 2011-03-05 | Disposition: A | Discharge status: Home / Self Care | Payer: Medicare Other | Attending: Emergency Medicine | Admitting: Emergency Medicine

## 2011-03-05 DIAGNOSIS — R110 Nausea: Secondary | ICD-10-CM | POA: Insufficient documentation

## 2011-03-05 DIAGNOSIS — Z9089 Acquired absence of other organs: Secondary | ICD-10-CM | POA: Insufficient documentation

## 2011-03-05 DIAGNOSIS — R109 Unspecified abdominal pain: Secondary | ICD-10-CM | POA: Insufficient documentation

## 2011-03-05 DIAGNOSIS — Z9889 Other specified postprocedural states: Secondary | ICD-10-CM | POA: Insufficient documentation

## 2011-03-05 LAB — LIPASE, BLOOD: Lipase: 29 U/L (ref 11–59)

## 2011-03-05 LAB — CBC
HCT: 36.1 % (ref 36.0–46.0)
Hemoglobin: 11.8 g/dL — ABNORMAL LOW (ref 12.0–15.0)
MCH: 30.3 pg (ref 26.0–34.0)
MCHC: 32.7 g/dL (ref 30.0–36.0)
RBC: 3.9 MIL/uL (ref 3.87–5.11)

## 2011-03-05 LAB — URINALYSIS, ROUTINE W REFLEX MICROSCOPIC
Bilirubin Urine: NEGATIVE
Glucose, UA: NEGATIVE mg/dL
Hgb urine dipstick: NEGATIVE
Specific Gravity, Urine: 1.007 (ref 1.005–1.030)
pH: 7 (ref 5.0–8.0)

## 2011-03-05 LAB — DIFFERENTIAL
Lymphocytes Relative: 41 % (ref 12–46)
Monocytes Absolute: 0.5 10*3/uL (ref 0.1–1.0)
Monocytes Relative: 9 % (ref 3–12)
Neutro Abs: 2.8 10*3/uL (ref 1.7–7.7)

## 2011-03-05 LAB — COMPREHENSIVE METABOLIC PANEL
Alkaline Phosphatase: 99 U/L (ref 39–117)
BUN: 9 mg/dL (ref 6–23)
CO2: 28 mEq/L (ref 19–32)
Calcium: 9.7 mg/dL (ref 8.4–10.5)
GFR calc Af Amer: >60 mL/min (ref >60)
GFR calc non Af Amer: >60 mL/min (ref >60)
Glucose, Bld: 106 mg/dL — ABNORMAL HIGH (ref 70–99)
Potassium: 3.8 mEq/L (ref 3.5–5.1)
Total Protein: 7.3 g/dL (ref 6.0–8.3)

## 2011-03-05 MED ORDER — IOHEXOL 300 MG/ML  SOLN
100.0000 mL | Freq: Once | INTRAMUSCULAR | Status: AC | PRN
  Administered 2011-03-05: 100 mL via INTRAVENOUS

## 2011-03-06 LAB — URINE CULTURE
Colony Count: NO GROWTH
Culture  Setup Time: 201208210105
Culture: NO GROWTH

## 2011-04-17 LAB — POCT HEMOGLOBIN-HEMACUE: Hemoglobin: 13

## 2012-02-06 DIAGNOSIS — R109 Unspecified abdominal pain: Secondary | ICD-10-CM | POA: Insufficient documentation

## 2012-02-06 DIAGNOSIS — F1120 Opioid dependence, uncomplicated: Secondary | ICD-10-CM | POA: Insufficient documentation

## 2012-04-15 DIAGNOSIS — G5790 Unspecified mononeuropathy of unspecified lower limb: Secondary | ICD-10-CM | POA: Insufficient documentation

## 2012-08-08 DIAGNOSIS — F32A Depression, unspecified: Secondary | ICD-10-CM | POA: Insufficient documentation

## 2012-08-08 DIAGNOSIS — F329 Major depressive disorder, single episode, unspecified: Secondary | ICD-10-CM | POA: Insufficient documentation

## 2013-05-24 ENCOUNTER — Emergency Department (HOSPITAL_COMMUNITY): Payer: Medicare Other

## 2013-05-24 ENCOUNTER — Inpatient Hospital Stay (HOSPITAL_COMMUNITY)
Admission: EM | Admit: 2013-05-24 | Discharge: 2013-05-26 | DRG: 918 | Disposition: A | Discharge status: Home Health | Payer: Medicare Other | Attending: Internal Medicine | Admitting: Internal Medicine

## 2013-05-24 ENCOUNTER — Encounter (HOSPITAL_COMMUNITY): Payer: Self-pay | Admitting: Emergency Medicine

## 2013-05-24 DIAGNOSIS — R269 Unspecified abnormalities of gait and mobility: Secondary | ICD-10-CM

## 2013-05-24 DIAGNOSIS — T424X4A Poisoning by benzodiazepines, undetermined, initial encounter: Principal | ICD-10-CM | POA: Diagnosis present

## 2013-05-24 DIAGNOSIS — R2681 Unsteadiness on feet: Secondary | ICD-10-CM | POA: Diagnosis present

## 2013-05-24 DIAGNOSIS — D649 Anemia, unspecified: Secondary | ICD-10-CM | POA: Diagnosis present

## 2013-05-24 DIAGNOSIS — E876 Hypokalemia: Secondary | ICD-10-CM | POA: Diagnosis present

## 2013-05-24 DIAGNOSIS — F419 Anxiety disorder, unspecified: Secondary | ICD-10-CM

## 2013-05-24 DIAGNOSIS — R1310 Dysphagia, unspecified: Secondary | ICD-10-CM | POA: Diagnosis present

## 2013-05-24 DIAGNOSIS — K5900 Constipation, unspecified: Secondary | ICD-10-CM | POA: Diagnosis present

## 2013-05-24 DIAGNOSIS — G8929 Other chronic pain: Secondary | ICD-10-CM | POA: Diagnosis present

## 2013-05-24 DIAGNOSIS — R109 Unspecified abdominal pain: Secondary | ICD-10-CM | POA: Diagnosis present

## 2013-05-24 DIAGNOSIS — R270 Ataxia, unspecified: Secondary | ICD-10-CM

## 2013-05-24 DIAGNOSIS — K219 Gastro-esophageal reflux disease without esophagitis: Secondary | ICD-10-CM | POA: Diagnosis present

## 2013-05-24 DIAGNOSIS — Z79899 Other long term (current) drug therapy: Secondary | ICD-10-CM

## 2013-05-24 DIAGNOSIS — F411 Generalized anxiety disorder: Secondary | ICD-10-CM | POA: Diagnosis present

## 2013-05-24 DIAGNOSIS — T424X1A Poisoning by benzodiazepines, accidental (unintentional), initial encounter: Secondary | ICD-10-CM | POA: Diagnosis present

## 2013-05-24 HISTORY — DX: Other chronic pain: G89.29

## 2013-05-24 HISTORY — DX: Anxiety disorder, unspecified: F41.9

## 2013-05-24 LAB — POCT I-STAT, CHEM 8
BUN: 7 mg/dL (ref 6–23)
Chloride: 89 mEq/L — ABNORMAL LOW (ref 96–112)
Creatinine, Ser: 0.9 mg/dL (ref 0.50–1.10)
Hemoglobin: 13.9 g/dL (ref 12.0–15.0)
Potassium: 2.6 mEq/L — CL (ref 3.5–5.1)
Sodium: 141 mEq/L (ref 135–145)
TCO2: 38 mmol/L (ref 0–100)

## 2013-05-24 LAB — RAPID URINE DRUG SCREEN, HOSP PERFORMED
Amphetamines: NOT DETECTED
Benzodiazepines: POSITIVE — AB
Tetrahydrocannabinol: NOT DETECTED

## 2013-05-24 LAB — BASIC METABOLIC PANEL
BUN: 6 mg/dL (ref 6–23)
Calcium: 9 mg/dL (ref 8.4–10.5)
GFR calc Af Amer: >90 mL/min (ref >90)
GFR calc non Af Amer: >90 mL/min (ref >90)
Glucose, Bld: 100 mg/dL — ABNORMAL HIGH (ref 70–99)
Potassium: 3.3 mEq/L — ABNORMAL LOW (ref 3.5–5.1)
Sodium: 137 mEq/L (ref 135–145)

## 2013-05-24 LAB — URINALYSIS, ROUTINE W REFLEX MICROSCOPIC
Ketones, ur: NEGATIVE mg/dL
Leukocytes, UA: NEGATIVE
Protein, ur: NEGATIVE mg/dL
Urobilinogen, UA: 0.2 mg/dL (ref 0.0–1.0)

## 2013-05-24 LAB — COMPREHENSIVE METABOLIC PANEL
ALT: 28 U/L (ref 0–35)
AST: 59 U/L — ABNORMAL HIGH (ref 0–37)
Albumin: 4.1 g/dL (ref 3.5–5.2)
Alkaline Phosphatase: 106 U/L (ref 39–117)
Chloride: 91 mEq/L — ABNORMAL LOW (ref 96–112)
Potassium: 3.7 mEq/L (ref 3.5–5.1)
Sodium: 136 mEq/L (ref 135–145)
Total Bilirubin: 0.5 mg/dL (ref 0.3–1.2)
Total Protein: 7.6 g/dL (ref 6.0–8.3)

## 2013-05-24 LAB — POCT I-STAT TROPONIN I: Troponin i, poc: 0.01 ng/mL (ref 0.00–0.08)

## 2013-05-24 LAB — DIFFERENTIAL
Basophils Absolute: 0 10*3/uL (ref 0.0–0.1)
Basophils Relative: 0 % (ref 0–1)
Monocytes Relative: 11 % (ref 3–12)
Neutro Abs: 2.6 10*3/uL (ref 1.7–7.7)
Neutrophils Relative %: 54 % (ref 43–77)

## 2013-05-24 LAB — APTT: aPTT: 26 seconds (ref 24–37)

## 2013-05-24 LAB — CBC
Hemoglobin: 12.9 g/dL (ref 12.0–15.0)
MCHC: 33.6 g/dL (ref 30.0–36.0)
Platelets: 196 10*3/uL (ref 150–400)
RDW: 13.8 % (ref 11.5–15.5)

## 2013-05-24 LAB — PROTIME-INR
INR: 0.99 (ref 0.00–1.49)
Prothrombin Time: 12.9 seconds (ref 11.6–15.2)

## 2013-05-24 LAB — MAGNESIUM: Magnesium: 3.2 mg/dL — ABNORMAL HIGH (ref 1.5–2.5)

## 2013-05-24 LAB — GLUCOSE, CAPILLARY: Glucose-Capillary: 92 mg/dL (ref 70–99)

## 2013-05-24 MED ORDER — SODIUM CHLORIDE 0.9 % IV BOLUS (SEPSIS)
500.0000 mL | Freq: Once | INTRAVENOUS | Status: AC
  Administered 2013-05-24: 500 mL via INTRAVENOUS

## 2013-05-24 MED ORDER — NORTRIPTYLINE HCL 25 MG PO CAPS
100.0000 mg | ORAL_CAPSULE | Freq: Every day | ORAL | Status: DC
  Administered 2013-05-24: 100 mg via ORAL
  Filled 2013-05-24 (×2): qty 4

## 2013-05-24 MED ORDER — LORAZEPAM 2 MG/ML IJ SOLN
0.5000 mg | Freq: Once | INTRAMUSCULAR | Status: AC
  Administered 2013-05-24: 0.5 mg via INTRAVENOUS
  Filled 2013-05-24: qty 1

## 2013-05-24 MED ORDER — LORAZEPAM 1 MG PO TABS
1.0000 mg | ORAL_TABLET | Freq: Three times a day (TID) | ORAL | Status: DC
  Administered 2013-05-25: 1 mg via ORAL
  Filled 2013-05-24: qty 1

## 2013-05-24 MED ORDER — POTASSIUM CHLORIDE 10 MEQ/100ML IV SOLN
10.0000 meq | INTRAVENOUS | Status: AC
  Administered 2013-05-24: 10 meq via INTRAVENOUS
  Filled 2013-05-24: qty 100

## 2013-05-24 MED ORDER — METHADONE HCL 5 MG PO TABS
5.0000 mg | ORAL_TABLET | Freq: Four times a day (QID) | ORAL | Status: DC
  Administered 2013-05-25 – 2013-05-26 (×6): 5 mg via ORAL
  Filled 2013-05-24 (×6): qty 1

## 2013-05-24 MED ORDER — DOCUSATE SODIUM 100 MG PO CAPS
100.0000 mg | ORAL_CAPSULE | Freq: Two times a day (BID) | ORAL | Status: DC
  Administered 2013-05-25 – 2013-05-26 (×3): 100 mg via ORAL
  Filled 2013-05-24 (×5): qty 1

## 2013-05-24 MED ORDER — ONDANSETRON HCL 4 MG PO TABS: 4.0000 mg | ORAL_TABLET | Freq: Four times a day (QID) | ORAL | Status: DC | PRN

## 2013-05-24 MED ORDER — SODIUM CHLORIDE 0.9 % IV SOLN
INTRAVENOUS | Status: AC
  Administered 2013-05-24: via INTRAVENOUS

## 2013-05-24 MED ORDER — TRAZODONE HCL 100 MG PO TABS
100.0000 mg | ORAL_TABLET | Freq: Three times a day (TID) | ORAL | Status: DC
  Administered 2013-05-24 – 2013-05-26 (×5): 100 mg via ORAL
  Filled 2013-05-24 (×7): qty 1

## 2013-05-24 MED ORDER — ONDANSETRON HCL 4 MG/2ML IJ SOLN: 4.0000 mg | Freq: Four times a day (QID) | INTRAMUSCULAR | Status: DC | PRN

## 2013-05-24 MED ORDER — LORAZEPAM 2 MG/ML IJ SOLN
1.0000 mg | Freq: Once | INTRAMUSCULAR | Status: AC
  Administered 2013-05-24: 1 mg via INTRAVENOUS
  Filled 2013-05-24: qty 1

## 2013-05-24 MED ORDER — LORAZEPAM 2 MG/ML IJ SOLN
0.5000 mg | Freq: Four times a day (QID) | INTRAMUSCULAR | Status: DC | PRN
  Administered 2013-05-25: 0.5 mg via INTRAVENOUS
  Filled 2013-05-24: qty 1

## 2013-05-24 MED ORDER — POTASSIUM CHLORIDE 10 MEQ/100ML IV SOLN
10.0000 meq | INTRAVENOUS | Status: AC
  Administered 2013-05-24 – 2013-05-25 (×2): 10 meq via INTRAVENOUS
  Filled 2013-05-24 (×2): qty 100

## 2013-05-24 MED ORDER — ENOXAPARIN SODIUM 40 MG/0.4ML ~~LOC~~ SOLN
40.0000 mg | Freq: Every day | SUBCUTANEOUS | Status: DC
  Administered 2013-05-24 – 2013-05-25 (×2): 40 mg via SUBCUTANEOUS
  Filled 2013-05-24 (×3): qty 0.4

## 2013-05-24 MED ORDER — ACETAMINOPHEN 325 MG PO TABS: 650.0000 mg | ORAL_TABLET | Freq: Four times a day (QID) | ORAL | Status: DC | PRN

## 2013-05-24 MED ORDER — SODIUM CHLORIDE 0.9 % IJ SOLN
3.0000 mL | Freq: Two times a day (BID) | INTRAMUSCULAR | Status: DC
  Administered 2013-05-24 – 2013-05-25 (×2): 3 mL via INTRAVENOUS

## 2013-05-24 MED ORDER — ACETAMINOPHEN 650 MG RE SUPP: 650.0000 mg | Freq: Four times a day (QID) | RECTAL | Status: DC | PRN

## 2013-05-24 MED ORDER — POTASSIUM CHLORIDE CRYS ER 20 MEQ PO TBCR
40.0000 meq | EXTENDED_RELEASE_TABLET | Freq: Once | ORAL | Status: AC
  Administered 2013-05-24: 40 meq via ORAL
  Filled 2013-05-24: qty 2

## 2013-05-24 NOTE — ED Notes (Signed)
Notified RN,Carrie pt. i-stat Chem 8 results potassium 2.6. Pt. i-Chem 8 reran to confirm results. Pt. Results the same as the first one and lab draw will be recollected.

## 2013-05-24 NOTE — ED Provider Notes (Signed)
3:40 PM Patient signed out to me by Rhea Bleacher, PA-C. Patient presents to the ED for altered mental status and "balance issues" that started this morning. Patient's husband accompanies her to provide the history.   Patient has had an extensive work up here including an MRI brain to rule out acute infarct, which was negative. Patient is still slightly altered and is ambulating with assistance at this time. Patient's symptoms are likely due to benzodiazapines that she is prescribed. Patient had a recent prescription change from Xanax to Klonopin and we suspect the patient may have taken too much. Patient will be observed in the ED for now. If patient does not improve, patient will be admitted for observation. Patient also found to have a K of 2.6; patient is receiving IV potassium.   7:31 PM Patient shows to be slowly improving however patient is still requiring assistance ambulating. Dr. Adela Glimpse will see the patient for observation admission.   Emilia Beck, PA-C 05/25/13 0105

## 2013-05-24 NOTE — ED Provider Notes (Signed)
CSN: [id number]     Arrival date & time 05/24/13  1119 History   First MD Initiated Contact with Patient 05/24/13 1127     Chief Complaint  Patient presents with  . Weakness  . Aphasia    slurred speech  . Altered Mental Status   (Consider location/radiation/quality/duration/timing/severity/associated sxs/prior Treatment) HPI Comments: Patient presents with confusion, imbalance and ataxia, slurred speech. Patient was last seen well at 9:30 PM last night. Patient awoke at approximately 2:30 this morning and noted that she was having some difficulty walking and went back to bed. Her husband found her in bed this morning at 10 AM. At this point the patient was having difficulty following directions, having slurred and garbled speech, was unable to get up and walk. Generalized weakness, no definite focal weakness. Husband assisted patient in getting up and coming to the hospital. Patient reports taking Klonopin for the first time yesterday. Patient has chronic pain stemming from previous surgery in her pelvis. She is on methadone for this. Patient denies recent head injuries. Husband notes that her coordination is abnormal. No history of hypertension, hypercholesterolemia, diabetes, smoking. Patient's mother had a stroke. No history of similar symptoms. The onset of this condition was acute. The course is gradually improving. Aggravating factors: none. Alleviating factors: none.    The history is provided by the patient and the spouse.    Past Medical History  Diagnosis Date  . GERD (gastroesophageal reflux disease)    Past Surgical History  Procedure Laterality Date  . Appendectomy    . Bladder surgery      Mesh implanted  . Shoulder surgery      X 2  . Tonsillectomy     Family History  Problem Relation Age of Onset  . Coronary artery disease Father 60  . Coronary artery disease Mother 68   History  Substance Use Topics  . Smoking status: Never Smoker   . Smokeless tobacco: Not on  file  . Alcohol Use: Not on file   OB History   Grav Para Term Preterm Abortions TAB SAB Ect Mult Living                 Review of Systems  Constitutional: Negative for fever.  HENT: Negative for congestion, dental problem, rhinorrhea and sinus pressure.   Eyes: Negative for photophobia, discharge, redness and visual disturbance.  Respiratory: Negative for shortness of breath.   Cardiovascular: Negative for chest pain.  Gastrointestinal: Negative for nausea and vomiting.  Musculoskeletal: Positive for gait problem. Negative for neck pain and neck stiffness.  Skin: Negative for rash.  Neurological: Positive for dizziness (like walking on water), speech difficulty and weakness (generalized). Negative for seizures, syncope, facial asymmetry, light-headedness, numbness and headaches.  Psychiatric/Behavioral: Negative for confusion.    Allergies  Sulfa antibiotics  Home Medications   Current Outpatient Rx  Name  Route  Sig  Dispense  Refill  . HYDROcodone-acetaminophen (VICODIN) 5-500 MG per tablet   Oral   Take 1 tablet by mouth every 6 (six) hours as needed.           . NON FORMULARY      Impramine  100mg  2 po daily          . omeprazole (PRILOSEC) 20 MG capsule   Oral   Take 20 mg by mouth daily.           . TRAZODONE HCL PO   Oral   Take by mouth as needed.  BP 146/89  Pulse 91  Temp(Src) 97.8 F (36.6 C) (Oral)  Resp 18  Ht 5' 3.5" (1.613 m)  Wt 113 lb (51.256 kg)  BMI 19.70 kg/m2 Physical Exam  Nursing note and vitals reviewed. Constitutional: She is oriented to person, place, and time. She appears well-developed and well-nourished.  HENT:  Head: Normocephalic and atraumatic.  Right Ear: Tympanic membrane, external ear and ear canal normal.  Left Ear: Tympanic membrane, external ear and ear canal normal.  Nose: Nose normal.  Mouth/Throat: Uvula is midline, oropharynx is clear and moist and mucous membranes are normal.  Eyes:  Conjunctivae, EOM and lids are normal. Pupils are equal, round, and reactive to light. Right eye exhibits no nystagmus. Left eye exhibits no nystagmus.  Neck: Normal range of motion. Neck supple.  Cardiovascular: Normal rate and regular rhythm.   Pulmonary/Chest: Effort normal and breath sounds normal.  Abdominal: Soft. There is no tenderness.  Musculoskeletal:       Cervical back: She exhibits normal range of motion, no tenderness and no bony tenderness.  Neurological: She is alert and oriented to person, place, and time. She has normal strength. She displays no tremor. No cranial nerve deficit or sensory deficit. She exhibits normal muscle tone. Coordination abnormal. GCS eye subscore is 4. GCS verbal subscore is 5. GCS motor subscore is 6.  + truncal ataxia, poor proprioception with up/down of fingers, difficulty following command with finger-to-nose testing. Difficulty with fine movements.   Skin: Skin is warm and dry.  Psychiatric: She has a normal mood and affect.    ED Course  Procedures (including critical care time) Labs Review Labs Reviewed  COMPREHENSIVE METABOLIC PANEL - Abnormal; Notable for the following:    Chloride 91 (*)    CO2 37 (*)    AST 59 (*)    GFR calc non Af Amer 90 (*)    All other components within normal limits  URINE RAPID DRUG SCREEN (HOSP PERFORMED) - Abnormal; Notable for the following:    Benzodiazepines POSITIVE (*)    All other components within normal limits  URINALYSIS, ROUTINE W REFLEX MICROSCOPIC - Abnormal; Notable for the following:    pH 8.5 (*)    All other components within normal limits  MAGNESIUM - Abnormal; Notable for the following:    Magnesium 3.2 (*)    All other components within normal limits  POTASSIUM - Abnormal; Notable for the following:    Potassium 2.7 (*)    All other components within normal limits  POCT I-STAT, CHEM 8 - Abnormal; Notable for the following:    Potassium 2.6 (*)    Chloride 89 (*)    All other  components within normal limits  POCT I-STAT, CHEM 8 - Abnormal; Notable for the following:    Potassium 2.6 (*)    Chloride 89 (*)    All other components within normal limits  ETHANOL  PROTIME-INR  APTT  CBC  DIFFERENTIAL  TROPONIN I  GLUCOSE, CAPILLARY  POCT I-STAT TROPONIN I   Imaging Review Ct Head Wo Contrast  05/24/2013   CLINICAL DATA:  Altered mental status, weakness, aphasia, slurred speech  EXAM: CT HEAD WITHOUT CONTRAST  TECHNIQUE: Contiguous axial images were obtained from the base of the skull through the vertex without intravenous contrast.  COMPARISON:  04/02/2009  FINDINGS: No evidence of parenchymal hemorrhage or extra-axial fluid collection. No mass lesion, mass effect, or midline shift.  No CT evidence of acute infarction.  Cerebral volume is within normal limits.  No ventriculomegaly.  The visualized paranasal sinuses are essentially clear. The mastoid air cells are unopacified.  No evidence of calvarial fracture.  IMPRESSION: No evidence of acute intracranial abnormality.   Electronically Signed   By: Charline Bills M.D.   On: 05/24/2013 12:43   Mr Brain Wo Contrast  05/24/2013   CLINICAL DATA:  Altered mental status. Aphasia. Gait instability and generalized weakness.  EXAM: MRI HEAD WITHOUT CONTRAST  TECHNIQUE: Multiplanar, multiecho pulse sequences of the brain and surrounding structures were obtained without intravenous contrast.  COMPARISON:  CT head from the same day.  MRI brain 01/08/2008.  FINDINGS: The study is mildly degraded by patient motion. No acute infarct, hemorrhage, or mass lesion is present. Mild atrophy and minimal white matter disease is likely within normal limits for age. The ventricles are of normal size. No significant extra-axial fluid collection is present.  Flow is present in the major intracranial arteries. The globes and orbits are intact. Minimal mucosal thickening is present in the anterior ethmoid air cells. There is some fluid in the right  mastoid air cells as well. No obstructing nasopharyngeal lesion is present.  IMPRESSION: 1. Normal MRI appearance of the brain for age. 2. Minimal sinus disease. 3. Minimal deep and fluid in the right mastoid air cells is chronic. No obstructing nasopharyngeal lesion is evident.   Electronically Signed   By: Gennette Pac M.D.   On: 05/24/2013 15:16    EKG Interpretation   None      11:45 AM Patient seen and examined. No code stroke: LSN was 2130 yesterday. Work-up initiated. Medications ordered.   Vital signs reviewed and are as follows: Filed Vitals:   05/24/13 1141  BP: 146/89  Pulse: 91  Temp: 97.8 F (36.6 C)  Resp: 18   12:11 PM D/w Dr. Freida Busman. MRI ordered as well.   3:47 PM At this point, imaging neg. K is confirmed low. K and fluids ordered.   Will hydrate. Patient still cannot ambulate without assistance.   Plan: hydrate, replete K, monitor. If improved, she can be d/c to home with PCP/psych f/u. I would stop klonopin as this is only modification to regimen.   If not improved or cannot walk, will likely need admission.    MDM   1. Ataxia    Pending improvement, work-up/plan above.     Renne Crigler, PA-C 05/24/13 1553

## 2013-05-24 NOTE — ED Provider Notes (Signed)
Medical screening examination/treatment/procedure(s) were conducted as a shared visit with non-physician practitioner(s) and myself.  I personally evaluated the patient during the encounter.  EKG Interpretation   None      patient seen examined. No focal deficits on her exam. Patient recently had Klonopin added to her medication regimen. Suspect that this is medication reaction as opposed to stroke. Will obtain MRI and results are pending   Toy Baker, MD 05/24/13 1225

## 2013-05-24 NOTE — ED Notes (Signed)
Pt due for 2 hour Neuro Check but pt in MRI

## 2013-05-24 NOTE — H&P (Signed)
PCP: Beverley Fiedler, MD    Chief Complaint:  Trouble walking  HPI: [name] is a 68 y.o. female   has a past medical history of GERD (gastroesophageal reflux disease); Anxiety; and Chronic pain.   Presented with  Patient have had a bladder surgery followed my numerous revisions resulting in chronic lower abdominal pain. She has been going to [facility] and has been taking methadone 5 mg QID. She Has had her medications adjusted by her psychiatrist Dr. Evelene Croon. She was started topamax but stopped that due to lethargy. Yesterday she was switched from ativan 1 mg TID and 2 mg and bed time to Klonapin 1 mg po qid. this was done but her insurance coverage issues.  In AM am she got up and could not walk she had slurred speech and had trouble ambulating. The weakness was generalized no localized symptoms. CVA work up has been done and MRI of the head was unremarkable. Patient is a bit confused about what she should be taking.   Review of Systems:    Pertinent positives include: confusion, trouble walking, slurred speech.   Constitutional:  No weight loss, night sweats, Fevers, chills, fatigue, weight loss  HEENT:  No headaches, Difficulty swallowing,Tooth/dental problems,Sore throat,  No sneezing, itching, ear ache, nasal congestion, post nasal drip,  Cardio-vascular:  No chest pain, Orthopnea, PND, anasarca, dizziness, palpitations.no Bilateral lower extremity swelling  GI:  No heartburn, indigestion, abdominal pain, nausea, vomiting, diarrhea, change in bowel habits, loss of appetite, melena, blood in stool, hematemesis Resp:  no shortness of breath at rest. No dyspnea on exertion, No excess mucus, no productive cough, No non-productive cough, No coughing up of blood.No change in color of mucus.No wheezing. Skin:  no rash or lesions. No jaundice GU:  no dysuria, change in color of urine, no urgency or frequency. No straining to urinate.  No flank pain.  Musculoskeletal:  No  joint pain or no joint swelling. No decreased range of motion. No back pain.  Psych:  No change in mood or affect. No depression or anxiety. No memory loss.  Neuro: no localizing neurological complaints, no tingling,  no double vision, patient is tremulous Otherwise ROS are negative except for above, 10 systems were reviewed  Past Medical History: Past Medical History  Diagnosis Date  . GERD (gastroesophageal reflux disease)   . Anxiety   . Chronic pain    Past Surgical History  Procedure Laterality Date  . Appendectomy    . Bladder surgery      Mesh implanted  . Shoulder surgery      X 2  . Tonsillectomy    . Abdominal wall mesh  removal       Medications: Prior to Admission medications   Medication Sig Start Date End Date Taking? Authorizing Provider  clonazePAM (KLONOPIN) 1 MG tablet Take 1 mg by mouth 4 (four) times daily.   Yes Historical Provider, MD  methadone (DOLOPHINE) 5 MG tablet Take 5 mg by mouth 4 (four) times daily.   Yes Historical Provider, MD  nortriptyline (PAMELOR) 50 MG capsule Take 100 mg by mouth at bedtime.   Yes Historical Provider, MD  traZODone (DESYREL) 100 MG tablet Take 100 mg by mouth 3 (three) times daily.   Yes Historical Provider, MD    Allergies:   Allergies  Allergen Reactions  . Sulfa Antibiotics     unknown    Social History:  Ambulatory   independently   Lives at   home  reports that she has never smoked. She does not have any smokeless tobacco history on file. She reports that she does not drink alcohol or use illicit drugs.   Family History: family history includes Coronary artery disease (age of onset: 35) in her father; Coronary artery disease (age of onset: 12) in her mother.    Physical Exam: Patient Vitals for the past 24 hrs:  BP Temp Temp src Pulse Resp SpO2 Height Weight  05/24/13 1515 140/91 mmHg - - 89 17 94 % - -  05/24/13 1227 - 97.8 F (36.6 C) - - - - - -  05/24/13 1145 146/89 mmHg - - 91 - 93 % - -   05/24/13 1141 146/89 mmHg 97.8 F (36.6 C) Oral 91 18 - 5' 3.5" (1.613 m) 51.256 kg (113 lb)    1. General:  in No Acute distress 2. Psychological: Alert but not fully Oriented, diminished judgment ability 3. Head/ENT:   Moist   Mucous Membranes                          Head Non traumatic, neck supple                          Normal   Dentition 4. SKIN: normal   Skin turgor,  Skin clean Dry and intact no rash 5. Heart: Regular rate and rhythm no Murmur, Rub or gallop 6. Lungs: Clear to auscultation bilaterally, no wheezes or crackles   7. Abdomen: Soft, non-tender, Non distended 8. Lower extremities: no clubbing, cyanosis, or edema 9. Neurologically strength 5 out of 5 in no 4 extremities cranial 2 through 12 intact body mass index is 19.7 kg/(m^2).   Labs on Admission:   Recent Labs  05/24/13 1209 05/24/13 1221 05/24/13 1229 05/24/13 1318  NA 136 141 140  --   K 3.7 2.6* 2.6* 2.7*  CL 91* 89* 89*  --   CO2 37*  --   --   --   GLUCOSE 97 95 91  --   BUN 8 7 7   --   CREATININE 0.64 0.90 0.90  --   CALCIUM 9.8  --   --   --   MG 3.2*  --   --   --     Recent Labs  05/24/13 1209  AST 59*  ALT 28  ALKPHOS 106  BILITOT 0.5  PROT 7.6  ALBUMIN 4.1   No results found for this basename: LIPASE, AMYLASE,  in the last 72 hours  Recent Labs  05/24/13 1209 05/24/13 1221 05/24/13 1229  WBC 4.9  --   --   NEUTROABS 2.6  --   --   HGB 12.9 13.9 13.9  HCT 38.4 41.0 41.0  MCV 88.3  --   --   PLT 196  --   --     Recent Labs  05/24/13 1209  TROPONINI <0.30   No results found for this basename: TSH, T4TOTAL, FREET3, T3FREE, THYROIDAB,  in the last 72 hours No results found for this basename: VITAMINB12, FOLATE, FERRITIN, TIBC, IRON, RETICCTPCT,  in the last 72 hours No results found for this basename: HGBA1C    Estimated Creatinine Clearance: 48.5 ml/min (by C-G formula based on Cr of 0.9). ABG    Component Value Date/Time   PHART 7.373 07/31/2010 1250   HCO3  21.4 07/31/2010 1250   TCO2 37 05/24/2013 1229   ACIDBASEDEF 2.8*  07/31/2010 1250     No results found for this basename: DDIMER     Cultures:    Component Value Date/Time   SDES URINE, RANDOM 03/05/2011 1316   SPECREQUEST NONE 03/05/2011 1316   CULT NO GROWTH 03/05/2011 1316   REPTSTATUS 03/06/2011 FINAL 03/05/2011 1316       Radiological Exams on Admission: Ct Head Wo Contrast  05/24/2013   CLINICAL DATA:  Altered mental status, weakness, aphasia, slurred speech  EXAM: CT HEAD WITHOUT CONTRAST  TECHNIQUE: Contiguous axial images were obtained from the base of the skull through the vertex without intravenous contrast.  COMPARISON:  04/02/2009  FINDINGS: No evidence of parenchymal hemorrhage or extra-axial fluid collection. No mass lesion, mass effect, or midline shift.  No CT evidence of acute infarction.  Cerebral volume is within normal limits.  No ventriculomegaly.  The visualized paranasal sinuses are essentially clear. The mastoid air cells are unopacified.  No evidence of calvarial fracture.  IMPRESSION: No evidence of acute intracranial abnormality.   Electronically Signed   By: Charline Bills M.D.   On: 05/24/2013 12:43   Mr Brain Wo Contrast  05/24/2013   CLINICAL DATA:  Altered mental status. Aphasia. Gait instability and generalized weakness.  EXAM: MRI HEAD WITHOUT CONTRAST  TECHNIQUE: Multiplanar, multiecho pulse sequences of the brain and surrounding structures were obtained without intravenous contrast.  COMPARISON:  CT head from the same day.  MRI brain 01/08/2008.  FINDINGS: The study is mildly degraded by patient motion. No acute infarct, hemorrhage, or mass lesion is present. Mild atrophy and minimal white matter disease is likely within normal limits for age. The ventricles are of normal size. No significant extra-axial fluid collection is present.  Flow is present in the major intracranial arteries. The globes and orbits are intact. Minimal mucosal thickening is present in  the anterior ethmoid air cells. There is some fluid in the right mastoid air cells as well. No obstructing nasopharyngeal lesion is present.  IMPRESSION: 1. Normal MRI appearance of the brain for age. 2. Minimal sinus disease. 3. Minimal deep and fluid in the right mastoid air cells is chronic. No obstructing nasopharyngeal lesion is evident.   Electronically Signed   By: Gennette Pac M.D.   On: 05/24/2013 15:16    Chart has been reviewed  Assessment/Plan  67 year old female with history of anxiety and and chronic pain on chronic methadone and benzodiazepines at recently has been adjusted presents with confusion and difficulty walking likely secondary to polypharmacy.  Present on Admission:  . Unstable gait - patient had had a negative MRI of the brain done while an [facility]. The history is more consistent with polypharmacy given recent adjustment of her medications. Discussed with patient the need to adjust her medication safely in the guidance of her psychiatrist. Will have PTOT evaluation  . Hypokalemia - will replace magnesium level has been checked  . Anxiety - patient appears to be tremulous at this point she has not had any of her benzodiazepines for the past 12 hours or so. Will prevent withdrawal to restart back on Ativan. Given oversedation will start slightly lower dose at 1 mg 4 times a day. If there is any evidence of continued withdrawal can start on [place] protocol . Chronic pain restart methadone   Prophylaxis:  Lovenox, Protonix  CODE STATUS: FULL CODE Other plan as per orders.  I have spent a total of 55 min on this admission  Sire Poet 05/24/2013, 8:09 PM

## 2013-05-24 NOTE — ED Notes (Signed)
Patient's husband states the patient was normal at 2130 yesterday and at 0230 today the patient had loss of balance, but otherwise was normal. Patient's husband returned home at 1000 and found patient with slurred speech, unable to walk, and altered mental status.

## 2013-05-25 DIAGNOSIS — G8929 Other chronic pain: Secondary | ICD-10-CM

## 2013-05-25 DIAGNOSIS — F411 Generalized anxiety disorder: Secondary | ICD-10-CM

## 2013-05-25 DIAGNOSIS — R279 Unspecified lack of coordination: Secondary | ICD-10-CM

## 2013-05-25 LAB — POCT I-STAT, CHEM 8
Chloride: 89 mEq/L — ABNORMAL LOW (ref 96–112)
Glucose, Bld: 91 mg/dL (ref 70–99)
HCT: 41 % (ref 36.0–46.0)
Hemoglobin: 13.9 g/dL (ref 12.0–15.0)
Potassium: 2.6 mEq/L — CL (ref 3.5–5.1)
TCO2: 37 mmol/L (ref 0–100)

## 2013-05-25 LAB — TSH: TSH: 0.788 u[IU]/mL (ref 0.350–4.500)

## 2013-05-25 LAB — MAGNESIUM: Magnesium: 2.1 mg/dL (ref 1.5–2.5)

## 2013-05-25 LAB — COMPREHENSIVE METABOLIC PANEL
ALT: 20 U/L (ref 0–35)
AST: 28 U/L (ref 0–37)
Calcium: 9.3 mg/dL (ref 8.4–10.5)
Creatinine, Ser: 0.54 mg/dL (ref 0.50–1.10)
GFR calc Af Amer: >90 mL/min (ref >90)
Glucose, Bld: 86 mg/dL (ref 70–99)
Potassium: 3.4 mEq/L — ABNORMAL LOW (ref 3.5–5.1)
Sodium: 139 mEq/L (ref 135–145)
Total Protein: 6.3 g/dL (ref 6.0–8.3)

## 2013-05-25 LAB — URINALYSIS, ROUTINE W REFLEX MICROSCOPIC
Glucose, UA: NEGATIVE mg/dL
Hgb urine dipstick: NEGATIVE
Ketones, ur: NEGATIVE mg/dL
Protein, ur: NEGATIVE mg/dL
Specific Gravity, Urine: 1.013 (ref 1.005–1.030)

## 2013-05-25 LAB — PHOSPHORUS: Phosphorus: 2.4 mg/dL (ref 2.3–4.6)

## 2013-05-25 LAB — CBC
HCT: 33.3 % — ABNORMAL LOW (ref 36.0–46.0)
Hemoglobin: 11 g/dL — ABNORMAL LOW (ref 12.0–15.0)
MCH: 29.6 pg (ref 26.0–34.0)
MCHC: 33.3 g/dL (ref 30.0–36.0)

## 2013-05-25 MED ORDER — SENNA 8.6 MG PO TABS
2.0000 | ORAL_TABLET | Freq: Every day | ORAL | Status: DC
  Administered 2013-05-25: 17.2 mg via ORAL
  Filled 2013-05-25: qty 2

## 2013-05-25 MED ORDER — MAGNESIUM HYDROXIDE 400 MG/5ML PO SUSP
30.0000 mL | Freq: Every day | ORAL | Status: DC
  Administered 2013-05-25: 30 mL via ORAL
  Filled 2013-05-25: qty 30

## 2013-05-25 MED ORDER — POTASSIUM CHLORIDE CRYS ER 20 MEQ PO TBCR
40.0000 meq | EXTENDED_RELEASE_TABLET | Freq: Once | ORAL | Status: AC
  Administered 2013-05-25: 40 meq via ORAL
  Filled 2013-05-25: qty 2

## 2013-05-25 MED ORDER — INFLUENZA VAC SPLIT QUAD 0.5 ML IM SUSP
0.5000 mL | INTRAMUSCULAR | Status: AC
  Administered 2013-05-26: 0.5 mL via INTRAMUSCULAR
  Filled 2013-05-25 (×2): qty 0.5

## 2013-05-25 MED ORDER — CLONAZEPAM 0.5 MG PO TABS
0.2500 mg | ORAL_TABLET | Freq: Three times a day (TID) | ORAL | Status: DC | PRN
  Administered 2013-05-25 – 2013-05-26 (×3): 0.25 mg via ORAL
  Filled 2013-05-25 (×3): qty 1

## 2013-05-25 MED ORDER — CLONAZEPAM 0.5 MG PO TABS: 0.2500 mg | ORAL_TABLET | Freq: Four times a day (QID) | ORAL | Status: DC | PRN

## 2013-05-25 MED ORDER — NORTRIPTYLINE HCL 25 MG PO CAPS
50.0000 mg | ORAL_CAPSULE | Freq: Every day | ORAL | Status: DC
  Filled 2013-05-25: qty 2

## 2013-05-25 NOTE — Progress Notes (Signed)
CARE MANAGEMENT NOTE 05/25/2013  Patient:  [name], [name]   Account Number:  [id number]  Date Initiated:  05/25/2013  Documentation initiated by:  [facility]  Subjective/Objective Assessment:   68 year old female admitted with unstable gait and hypokalemia.     Action/Plan:   From home with husband. PT recommending SNF vs HHPT.   Anticipated DC Date:  05/28/2013   Anticipated DC Plan:  HOME W HOME HEALTH SERVICES  In-house referral  Clinical Social Worker      DC Planning Services  CM consult      Choice offered to / List presented to:  C-3 Spouse           HH agency  [facility]   Status of service:  In process, will continue to follow  Medicare Important Message given?  NA - LOS <3 / Initial given by admissions  Per UR Regulation:  Reviewed for med. necessity/level of care/duration of stay  Comments:  05/25/13 Algernon Huxley RN BSN [phone] Met with pt and husband at bedside. Pt confused and unable to participate in conversation. PT is recommending SNF vs HHPT but she does not have a qualifying stay for Medicare to pay for rehab. Pt want HH servcies and he chose [facility] to provide the services. I also provided him with the list of private duty care givers and made him aware he has to private pay for those services.

## 2013-05-25 NOTE — Progress Notes (Signed)
[facility] PROGRESS NOTE  [name] WUJ:[id number] DOB: 1945/10/02 DOA: 05/24/2013 PCP: Beverley Fiedler, MD  Assessment/Plan  Confusion and gait instability likely secondary to benzodiazepine overdose and polypharmacy.  Spoke at length with husband and contacted Dr. Evelene Croon and attempted to speak with her pain specialist at [facility] but was not able to reach anyone.  - patient has not recently taken nortriptyline nor topamax due to sedation - Clonazepam dose was likely too high in the setting of methadone use -  Reduced clonazepam to 0.25mg  TID prn anxiety -  Anticipate improvement in her mentation over the next 48-72 hours -  Outpatient PT/OT/aid/RN  Hypokalemia, mild, may be due to poor diet.  Magnesium wnl.  Oral KCl x 1  Anxiety, recommend follow up appointment tomorrow at 1:30 for possible addition of SSRI/SNRI if not already tried and to confirm dose of clonazepam  Chronic pain, continue methadone  Dysphagia, concern for aspiration.  -  Speech therapy:  Cleared for regular diet  Diet:  regular Access:  PIV IVF:  OFF Proph:  lovenox  Code Status: full Family Communication: patient and her husband Disposition Plan: pending improvement in mentation.     Consultants:  None  Procedures:  CT brain  MRI brain  Antibiotics:  none   HPI/Subjective:  Patient states she still feels confused.  Unsteady on her feet per PT/OT.  RN states she had difficulty swallowing her pills.  Persistent chronic abdominal pain worse in the RLQ.       Objective: Filed Vitals:   05/24/13 2254 05/24/13 2302 05/25/13 0500 05/25/13 1409  BP: 118/84 134/83 137/83 132/80  Pulse: 86 87 89 93  Temp:   97.4 F (36.3 C) 97.9 F (36.6 C)  TempSrc:    Axillary  Resp: 16 16 20 19   Height:      Weight:      SpO2: 99% 98% 98% 100%    Intake/Output Summary (Last 24 hours) at 05/25/13 1718 Last data filed at 05/25/13 1410  Gross per 24 hour  Intake 344.67 ml  Output    750 ml  Net  -405.33 ml   Filed Weights   05/24/13 1141 05/24/13 2203  Weight: 51.256 kg (113 lb) 49.3 kg (108 lb 11 oz)    Exam:   General:  CF, No acute distress  HEENT:  NCAT, MMM  Cardiovascular:  RRR, nl S1, S2 no mrg, 2+ pulses, warm extremities  Respiratory:  CTAB, no increased WOB  Abdomen:   NABS, soft, ND, very TTP in the RLQ without rebound or guarding  MSK:   Normal tone and bulk, no LEE  Neuro:  Grossly intact  Data Reviewed: Basic Metabolic Panel:  Recent Labs Lab 05/24/13 1209 05/24/13 1221 05/24/13 1229 05/24/13 1318 05/24/13 1930 05/25/13 0526  NA 136 141 140  --  137 139  K 3.7 2.6* 2.6* 2.7* 3.3* 3.4*  CL 91* 89* 89*  --  100 106  CO2 37*  --   --   --  30 25  GLUCOSE 97 95 91  --  100* 86  BUN 8 7 7   --  6 5*  CREATININE 0.64 0.90 0.90  --  0.52 0.54  CALCIUM 9.8  --   --   --  9.0 9.3  MG 3.2*  --   --   --   --  2.1  PHOS  --   --   --   --   --  2.4   Liver Function Tests:  Recent Labs Lab 05/24/13 1209 05/25/13 0526  AST 59* 28  ALT 28 20  ALKPHOS 106 102  BILITOT 0.5 0.4  PROT 7.6 6.3  ALBUMIN 4.1 3.5   No results found for this basename: LIPASE, AMYLASE,  in the last 168 hours No results found for this basename: AMMONIA,  in the last 168 hours CBC:  Recent Labs Lab 05/24/13 1209 05/24/13 1221 05/24/13 1229 05/25/13 0526  WBC 4.9  --   --  4.4  NEUTROABS 2.6  --   --   --   HGB 12.9 13.9 13.9 11.0*  HCT 38.4 41.0 41.0 33.3*  MCV 88.3  --   --  88.8  PLT 196  --   --  163   Cardiac Enzymes:  Recent Labs Lab 05/24/13 1209  TROPONINI <0.30   BNP (last 3 results) No results found for this basename: PROBNP,  in the last 8760 hours CBG:  Recent Labs Lab 05/24/13 1156  GLUCAP 92    No results found for this or any previous visit (from the past 240 hour(s)).   Studies: Ct Head Wo Contrast  05/24/2013   CLINICAL DATA:  Altered mental status, weakness, aphasia, slurred speech  EXAM: CT HEAD WITHOUT CONTRAST  TECHNIQUE:  Contiguous axial images were obtained from the base of the skull through the vertex without intravenous contrast.  COMPARISON:  04/02/2009  FINDINGS: No evidence of parenchymal hemorrhage or extra-axial fluid collection. No mass lesion, mass effect, or midline shift.  No CT evidence of acute infarction.  Cerebral volume is within normal limits.  No ventriculomegaly.  The visualized paranasal sinuses are essentially clear. The mastoid air cells are unopacified.  No evidence of calvarial fracture.  IMPRESSION: No evidence of acute intracranial abnormality.   Electronically Signed   By: Charline Bills M.D.   On: 05/24/2013 12:43   Mr Brain Wo Contrast  05/24/2013   CLINICAL DATA:  Altered mental status. Aphasia. Gait instability and generalized weakness.  EXAM: MRI HEAD WITHOUT CONTRAST  TECHNIQUE: Multiplanar, multiecho pulse sequences of the brain and surrounding structures were obtained without intravenous contrast.  COMPARISON:  CT head from the same day.  MRI brain 01/08/2008.  FINDINGS: The study is mildly degraded by patient motion. No acute infarct, hemorrhage, or mass lesion is present. Mild atrophy and minimal white matter disease is likely within normal limits for age. The ventricles are of normal size. No significant extra-axial fluid collection is present.  Flow is present in the major intracranial arteries. The globes and orbits are intact. Minimal mucosal thickening is present in the anterior ethmoid air cells. There is some fluid in the right mastoid air cells as well. No obstructing nasopharyngeal lesion is present.  IMPRESSION: 1. Normal MRI appearance of the brain for age. 2. Minimal sinus disease. 3. Minimal deep and fluid in the right mastoid air cells is chronic. No obstructing nasopharyngeal lesion is evident.   Electronically Signed   By: Gennette Pac M.D.   On: 05/24/2013 15:16    Scheduled Meds: . docusate sodium  100 mg Oral BID  . enoxaparin (LOVENOX) injection  40 mg  Subcutaneous QHS  . [START ON 05/26/2013] influenza vac split quadrivalent PF  0.5 mL Intramuscular Tomorrow-1000  . magnesium hydroxide  30 mL Oral Daily  . methadone  5 mg Oral QID  . senna  2 tablet Oral QHS  . sodium chloride  3 mL Intravenous Q12H  . traZODone  100 mg Oral TID   Continuous Infusions:  Active Problems:   Polypharmacy   Hypokalemia   Unstable gait   Anxiety   Chronic pain    Time spent: 30 min    Carline Dura  [facility] Pager [phone]. If 7PM-7AM, please contact night-coverage at [email or web address], password [facility] 05/25/2013, 5:18 PM  LOS: 1 day

## 2013-05-25 NOTE — Progress Notes (Signed)
Pt noted to have frequent PVC's. Pt asymptomatic and VS stable. Scheduled EKG was performed. NP on call was notified. No new orders at this time. Will continue to monitor pt.

## 2013-05-25 NOTE — Evaluation (Signed)
Physical Therapy Evaluation Patient Details Name: [name] MRN: [id number] DOB: 1946/06/05 Today's Date: 05/25/2013 Time: 5284-1324 PT Time Calculation (min): 26 min  PT Assessment / Plan / Recommendation History of Present Illness  Patient have had a bladder surgery followed my numerous revisions resulting in chronic lower abdominal pain. She has been going to [facility] and has been taking methadone 5 mg QID. She Has had her medications adjusted by her psychiatrist Dr. Evelene Croon. She was started topamax but stopped that due to lethargy. Yesterday she was switched from ativan 1 mg TID and 2 mg and bed time to Klonapin 1 mg po qid. this was done but her insurance coverage issues.  In AM am she got up and could not walk she had slurred speech and had trouble ambulating. The weakness was generalized no localized symptoms. CVA work up has been done and MRI of the head was unremarkable. Patient is a bit confused about what she should be taking.    Clinical Impression  *Pt admitted with slurred speech, difficulty walking thought to be related to new medication*. Pt currently with functional limitations due to the deficits listed below (see PT Problem List).  Pt will benefit from skilled PT to increase their independence and safety with mobility to allow discharge to the venue listed below.   **    PT Assessment  Patient needs continued PT services    Follow Up Recommendations  Supervision/Assistance - 24 hour;SNF (SNF vs. HHPT depending on progress, at present needs 24* assist)    Does the patient have the potential to tolerate intense rehabilitation      Barriers to Discharge   at present functional level, pt would need 24* supervision and assist with mobility since she requires assist for balance when walking, husband works    Engineer, agricultural with 5" wheels    Recommendations for Other Services OT consult   Frequency Min 3X/week    Precautions /  Restrictions Precautions Precautions: Fall Restrictions Weight Bearing Restrictions: No   Pertinent Vitals/Pain *7/10 R lower abdomen Chronic pain RN notified**      Mobility  Bed Mobility Bed Mobility: Supine to Sit Supine to Sit: 6: Modified independent (Device/Increase time);With rails Transfers Transfers: Sit to Stand;Stand to Sit Sit to Stand: 4: Min guard Stand to Sit: 4: Min guard Details for Transfer Assistance: VCs for safety, min/guard for balance, pt reported mild dizziness initially in standing Ambulation/Gait Ambulation/Gait Assistance: 4: Min assist Ambulation Distance (Feet): 150 Feet Assistive device: 1 person hand held assist Gait Pattern: Decreased step length - right;Decreased step length - left Gait velocity: WFL General Gait Details: min A for balance, consider trial of SPC next visit if pt remains unsteady    Exercises     PT Diagnosis: Difficulty walking  PT Problem List: Decreased activity tolerance;Decreased balance;Decreased safety awareness;Decreased cognition PT Treatment Interventions: Gait training;Functional mobility training;Therapeutic activities;Patient/family education;Balance training     PT Goals(Current goals can be found in the care plan section) Acute Rehab PT Goals Patient Stated Goal: return to independence with mobility PT Goal Formulation: With patient Time For Goal Achievement: 06/08/13 Potential to Achieve Goals: Good  Visit Information  Last PT Received On: 05/25/13 Assistance Needed: +1 PT/OT Co-Evaluation/Treatment: Yes History of Present Illness: Patient have had a bladder surgery followed my numerous revisions resulting in chronic lower abdominal pain. She has been going to [facility] and has been taking methadone 5 mg QID. She Has had her medications adjusted  by her psychiatrist Dr. Evelene Croon. She was started topamax but stopped that due to lethargy. Yesterday she was switched from ativan 1 mg TID and 2 mg and bed  time to Klonapin 1 mg po qid. this was done but her insurance coverage issues.  In AM am she got up and could not walk she had slurred speech and had trouble ambulating. The weakness was generalized no localized symptoms. CVA work up has been done and MRI of the head was unremarkable. Patient is a bit confused about what she should be taking.         Prior Functioning  Home Living Family/patient expects to be discharged to:: Private residence Living Arrangements: Spouse/significant other Available Help at Discharge: Family;Available PRN/intermittently (spouse works) Type of Home: House Home Access: Stairs to enter Secretary/administrator of Steps: 2 Entrance Stairs-Rails: None Home Equipment: None Prior Function Level of Independence: Independent Communication Communication: No difficulties    Cognition  Cognition Arousal/Alertness: Awake/alert Behavior During Therapy: Impulsive Overall Cognitive Status: Impaired/Different from baseline Area of Impairment: Safety/judgement;Problem solving Safety/Judgement: Decreased awareness of deficits;Decreased awareness of safety General Comments: able to follow directions, but is a bit confused    Extremity/Trunk Assessment Lower Extremity Assessment Lower Extremity Assessment: Overall WFL for tasks assessed Cervical / Trunk Assessment Cervical / Trunk Assessment: Normal   Balance Balance Balance Assessed: Yes Static Sitting Balance Static Sitting - Balance Support: No upper extremity supported;Feet supported Static Sitting - Level of Assistance: 6: Modified independent (Device/Increase time) Static Sitting - Comment/# of Minutes: 1  End of Session PT - End of Session Equipment Utilized During Treatment: Gait belt Activity Tolerance: Patient tolerated treatment well Patient left: in chair;with call bell/phone within reach;with chair alarm set Nurse Communication: Mobility status  GP Functional Assessment Tool Used: clinical  judgement Functional Limitation: Mobility: Walking and moving around Mobility: Walking and Moving Around Current Status [phone]): At least 1 percent but less than 20 percent impaired, limited or restricted Mobility: Walking and Moving Around Goal Status [phone]): 0 percent impaired, limited or restricted   Tamala Ser 05/25/2013, 11:29 AM [phone]

## 2013-05-25 NOTE — Progress Notes (Signed)
CSW received consult for assistance with advance directives - CSW provided packet to patient & visitor at bedside. Patient states that she will read over it and complete once she is discharged & does not wish to complete at this time.   No other CSW needs identified - CSW signing off.   Unice Bailey, LCSW [facility] Clinical Social Worker cell #: [phone]

## 2013-05-25 NOTE — Evaluation (Signed)
Occupational Therapy Evaluation Patient Details Name: [name] MRN: [id number] DOB: [birth year] Today's Date: 05/25/2013 Time: 1478-2956 OT Time Calculation (min): 30 min  OT Assessment / Plan / Recommendation History of present illness Patient have had a bladder surgery followed my numerous revisions resulting in chronic lower abdominal pain. She has been going to [facility] and has been taking methadone 5 mg QID. She Has had her medications adjusted by her psychiatrist Dr. Evelene Croon. She was started topamax but stopped that due to lethargy. Yesterday she was switched from ativan 1 mg TID and 2 mg and bed time to Klonapin 1 mg po qid. this was done but her insurance coverage issues.  In AM am she got up and could not walk she had slurred speech and had trouble ambulating. The weakness was generalized no localized symptoms. CVA work up has been done and MRI of the head was unremarkable. Patient is a bit confused about what she should be taking.     Clinical Impression   Pt presents with decreased cognition and is unsteady with transfers/ADL. She has difficulty problem solving and sequencing tasks and will benefit from skilled OT services to maximize ADL independence for d/c to next venue of care.    OT Assessment  Patient needs continued OT Services    Follow Up Recommendations  Supervision/Assistance - 24 hour;Other (comment) (will need 24/7 unless able to progress. otherwise may need SNF; HHOT versus SNF depending on progress and family support)    Barriers to Discharge      Equipment Recommendations   (may need tubseat. will discuss and assess)    Recommendations for Other Services    Frequency  Min 2X/week    Precautions / Restrictions Precautions Precautions: Fall Restrictions Weight Bearing Restrictions: No   Pertinent Vitals/Pain Abdominal discomfort; nursing made aware. 8/10    ADL  Eating/Feeding:  (NPO) Grooming: Performed;Teeth care;Minimal assistance (mod verbal  cues) Where Assessed - Grooming: Unsupported standing Upper Body Bathing: Simulated;Chest;Right arm;Left arm;Abdomen;Set up;Supervision/safety Where Assessed - Upper Body Bathing: Unsupported sitting Lower Body Bathing: Simulated;Minimal assistance Where Assessed - Lower Body Bathing: Supported sit to stand Upper Body Dressing: Simulated;Minimal assistance (with managing new gown) Where Assessed - Upper Body Dressing: Unsupported sitting Lower Body Dressing: Simulated;Minimal assistance Where Assessed - Lower Body Dressing: Supported sit to stand Toilet Transfer: Performed;Minimal assistance (some unsteadiness noted) Acupuncturist: Comfort height toilet Toileting - Clothing Manipulation and Hygiene: Simulated;Minimal assistance Where Assessed - Toileting Clothing Manipulation and Hygiene: Standing ADL Comments: Noted pt having difficulty problem solving and sequencing tasks.  Once she finished toileting she stepped over to the sink and started to open toothpaste. Gave her a question cue to see if she would like to first wash her hands. SHe seemed anxious like and was rushing through tasks and sort of clumsy at times. Once done brushing her teeth she left the water running and needed  cues to remember to turn off water. She had difficulty problem solving how to turn off the water and it took several attempts. She also was noted to have some difficulty with using correct word at times (used bed for chair) but was able to correct herself. She had difficulty remembering who therapy and her nurse was. Feel she will need 24/7 for safety at d/c. Pt not able to verbalize how to use call button so explained use and pt able to state how to use call button to MD who came in after therapy.     OT  Diagnosis: Generalized weakness;Cognitive deficits  OT Problem List: Decreased strength;Decreased knowledge of use of DME or AE;Decreased cognition OT Treatment Interventions: Self-care/ADL training;DME  and/or AE instruction;Therapeutic activities;Patient/family education   OT Goals(Current goals can be found in the care plan section) Acute Rehab OT Goals Patient Stated Goal: return to independence with mobility OT Goal Formulation: With patient Time For Goal Achievement: 06/08/13 Potential to Achieve Goals: Good  Visit Information  Last OT Received On: 05/25/13 Assistance Needed: +1 PT/OT Co-Evaluation/Treatment: Yes History of Present Illness: Patient have had a bladder surgery followed my numerous revisions resulting in chronic lower abdominal pain. She has been going to [facility] and has been taking methadone 5 mg QID. She Has had her medications adjusted by her psychiatrist Dr. Evelene Croon. She was started topamax but stopped that due to lethargy. Yesterday she was switched from ativan 1 mg TID and 2 mg and bed time to Klonapin 1 mg po qid. this was done but her insurance coverage issues.  In AM am she got up and could not walk she had slurred speech and had trouble ambulating. The weakness was generalized no localized symptoms. CVA work up has been done and MRI of the head was unremarkable. Patient is a bit confused about what she should be taking.         Prior Functioning     Home Living Family/patient expects to be discharged to:: Private residence Living Arrangements: Spouse/significant other Available Help at Discharge: Family;Available PRN/intermittently (spouse works) Type of Home: House Home Access: Stairs to enter Secretary/administrator of Steps: 2 Entrance Stairs-Rails: None Home Equipment: None Additional Comments: unsure if she has a shower stall or tub Prior Function Level of Independence: Independent Communication Communication: No difficulties         Vision/Perception     Cognition  Cognition Arousal/Alertness: Awake/alert Behavior During Therapy: Anxious;Impulsive Overall Cognitive Status: Impaired/Different from baseline Area of Impairment:  Safety/judgement;Problem solving;Memory;Awareness Memory: Decreased short-term memory Safety/Judgement: Decreased awareness of safety;Decreased awareness of deficits Problem Solving: Difficulty sequencing;Requires verbal cues General Comments: noted confusion throughout session    Extremity/Trunk Assessment Upper Extremity Assessment Upper Extremity Assessment: Overall WFL for tasks assessed (seemed a bit nervous like)  Cervical / Trunk Assessment Cervical / Trunk Assessment: Normal     Mobility Bed Mobility Bed Mobility: Supine to Sit Supine to Sit: 6: Modified independent (Device/Increase time);With rails Transfers Sit to Stand: 4: Min guard Stand to Sit: 4: Min guard Details for Transfer Assistance: VCs for safety, min/guard for balance, pt reported mild dizziness initially in standing     Exercise     Balance Balance Balance Assessed: Yes Static Sitting Balance Static Sitting - Balance Support: No upper extremity supported;Feet supported Static Sitting - Level of Assistance: 6: Modified independent (Device/Increase time) Static Sitting - Comment/# of Minutes: 1 Dynamic Standing Balance Dynamic Standing - Level of Assistance: 4: Min assist   End of Session OT - End of Session Equipment Utilized During Treatment: Gait belt Activity Tolerance: Patient tolerated treatment well Patient left: in chair;with call bell/phone within reach;with chair alarm set  GO     Lennox Laity [phone] 05/25/2013, 11:41 AM

## 2013-05-25 NOTE — Progress Notes (Signed)
Utilization review completed.  

## 2013-05-25 NOTE — Evaluation (Signed)
Clinical/Bedside Swallow Evaluation Patient Details  Name: KATINA REMICK MRN: [id number] Date of Birth: [birth year]  Today's Date: 05/25/2013 Time: 6962-9528 SLP Time Calculation (min): 30 min  Past Medical History:  Past Medical History  Diagnosis Date  . GERD (gastroesophageal reflux disease)   . Anxiety   . Chronic pain    Past Surgical History:  Past Surgical History  Procedure Laterality Date  . Appendectomy    . Bladder surgery      Mesh implanted  . Shoulder surgery      X 2  . Tonsillectomy    . Abdominal wall mesh  removal     HPI:  67 yo adm to [facility] with AMS, slurred speech, CVA work up negative including head CT and MRI.  Pt has h/o chronic pain and is being seen by pain clinic at [facility].  Patient has had a bladder surgery followed my numerous revisions resulting in chronic pain. She has been going to [facility] and has been taking methadone 5 mg QID. She Has had her medications adjusted by her psychiatrist Dr. Evelene Croon. She was started topamax but stopped that due to lethargy.  In AM am she got up and could not walk she had slurred speech and had trouble ambulating. The weakness was generalized no localized symptoms.   Swallow evaluation was ordered due to concern for dysphagia with AMS.     Assessment / Plan / Recommendation Clinical Impression  Pt presents without focal CN deficits, she does have a dry cough noted during and without intake.  Admits to h/o GERD for which she takes Prilosec.  Pt able to self feed, eating slowly.  Friend presents who reports pt with poor intake, eats small amounts at a time.  No overt s/s of aspiration with po observed.  Pt does admit to occasional "choking" with food approximately once a week, denies pulmonary infections and attributes issues to GERD.     Recommend to advance to regular/thin diet with precautions.  Xerostomia also acknowledged by pt for which SLP provided Biotene.  Using teach back, SLP assured pt understood aspiration  precautions and compensation strategies.    SLP to follow up x1 to assure all eductation is completed and pt tolerating po diet.  Suspect mental status upon admit from medication was source of transient dysphagia.     Pt appeared with cognitive deficits in areas of attention, memory and judgement that may impact airway protection and pt safety.      Aspiration Risk  Mild    Diet Recommendation Regular;Thin liquid   Liquid Administration via: Cup;Straw Medication Administration: Whole meds with liquid Supervision: Patient able to self feed;Intermittent supervision to cue for compensatory strategies Compensations: Slow rate;Small sips/bites;Check for pocketing Postural Changes and/or Swallow Maneuvers: Seated upright 90 degrees;Upright 30-60 min after meal    Other  Recommendations Oral Care Recommendations: Oral care BID   Follow Up Recommendations  None    Frequency and Duration min 1 x/week  1 week   Pertinent Vitals/Pain Afebrile, decreased    SLP Swallow Goals  n/a   Swallow Study Prior Functional Status   see HHX section    General Date of Onset: 05/25/13 HPI: 67 yo adm to [facility] with AMS, slurred speech, CVA work up negative including head CT and MRI.  Pt has h/o chronic pain and is being seen by pain clinic at [facility].  Patient has had a bladder surgery followed my numerous revisions resulting in chronic pain. She has been going to  [facility] and has been taking methadone 5 mg QID. She Has had her medications adjusted by her psychiatrist Dr. Evelene Croon. She was started topamax but stopped that due to lethargy.  In AM am she got up and could not walk she had slurred speech and had trouble ambulating. The weakness was generalized no localized symptoms.   Swallow evaluation was ordered due to concern for dysphagia with AMS.   Type of Study: Bedside swallow evaluation Diet Prior to this Study: Dysphagia 2 (chopped);Thin liquids Temperature Spikes Noted: No Respiratory Status:  Room air History of Recent Intubation: No Behavior/Cognition: Alert;Cooperative;Pleasant mood;Confused;Impulsive;Decreased sustained attention Oral Cavity - Dentition: Adequate natural dentition Self-Feeding Abilities: Able to feed self Patient Positioning: Upright in bed Baseline Vocal Quality: Clear Volitional Cough: Strong Volitional Swallow: Able to elicit    Oral/Motor/Sensory Function Overall Oral Motor/Sensory Function: Appears within functional limits for tasks assessed (generalized weakness, no focal deficits)   Ice Chips Ice chips: Not tested   Thin Liquid Thin Liquid: Within functional limits Presentation: Self Fed;Straw Other Comments: dry cough x1 of 5 boluses    Nectar Thick Nectar Thick Liquid: Not tested   Honey Thick Honey Thick Liquid: Not tested   Puree Puree: Within functional limits Presentation: Self Fed;Spoon   Solid   GO    Solid: Impaired Oral Phase Impairments: Reduced lingual movement/coordination;Impaired anterior to posterior transit Other Comments: slow but effective mastication with adequate ability to clear airway       Donavan Burnet, MS [facility] SLP [phone]

## 2013-05-25 NOTE — Progress Notes (Signed)
CSW received consult for SNF placement today if possible. CSW spoke with patient's husband at bedside re: SNF, though due to patient being under observation status and Medicare being the payor source - SNF stay would not be covered by her insurance. CSW spoke with husband about home health services vs. Private duty caregiver. Husband expressed interest in home health - RNCM, Windell Moulding made aware & will speak with husband this afternoon.   No other CSW needs identified - CSW signing off.   Unice Bailey, LCSW [facility] Clinical Social Worker cell #: [phone]

## 2013-05-26 DIAGNOSIS — T424X1A Poisoning by benzodiazepines, accidental (unintentional), initial encounter: Secondary | ICD-10-CM

## 2013-05-26 MED ORDER — MAGNESIUM CITRATE PO SOLN
1.0000 | Freq: Once | ORAL | Status: AC
  Administered 2013-05-26: 1 via ORAL

## 2013-05-26 MED ORDER — FLEET ENEMA 7-19 GM/118ML RE ENEM
1.0000 | ENEMA | Freq: Once | RECTAL | Status: AC
  Administered 2013-05-26: 09:00:00 via RECTAL
  Filled 2013-05-26: qty 1

## 2013-05-26 MED ORDER — CLONAZEPAM 0.5 MG PO TABS: 0.2500 mg | ORAL_TABLET | Freq: Three times a day (TID) | ORAL | Status: DC | PRN

## 2013-05-26 MED ORDER — BISACODYL 5 MG PO TBEC
5.0000 mg | DELAYED_RELEASE_TABLET | Freq: Once | ORAL | Status: AC
  Administered 2013-05-26: 5 mg via ORAL
  Filled 2013-05-26: qty 1

## 2013-05-26 NOTE — Progress Notes (Signed)
[facility]  Patient Status: New  [facility] is providing the following services: RN, PT and OT.  I spoke with the patient today and confirmed that her PCP is Gwenlyn Perking, PA at [address] on [place], [place].    If patient discharges after hours, please call [phone].   Lanae Crumbly 05/26/2013, 9:50 AM

## 2013-05-26 NOTE — Discharge Summary (Addendum)
Physician Discharge Summary  KASSIDI ELZA ION:[id number] DOB: 1945/10/25 DOA: 05/24/2013  PCP: Beverley Fiedler, MD  Admit date: 05/24/2013 Discharge date: 05/26/2013  Recommendations for Outpatient Follow-up:  1. Home health PT/OT/RN 2. Decreased clonazepam to 0.25mg  TID prn 3. Arranged for close follow up with psychiatry, 1:30PM appointment today 4. PCP in 2 weeks for repeat BMP and CBC to check anemia and potassium  Discharge Diagnoses:  Principal Problem:   Benzodiazepine overdose, unintentional Active Problems:   Polypharmacy   Hypokalemia   Unstable gait   Anxiety   Chronic pain   Discharge Condition: stable, improved  Diet recommendation: regular  Wt Readings from Last 3 Encounters:  05/24/13 49.3 kg (108 lb 11 oz)  01/31/11 54.432 kg (120 lb)    History of present illness:  [name] is a 67 y.o. female  has a past medical history of GERD (gastroesophageal reflux disease); Anxiety; and Chronic pain.  Presented with  Patient have had a bladder surgery followed my numerous revisions resulting in chronic lower abdominal pain. She has been going to [facility] and has been taking methadone 5 mg QID. She Has had her medications adjusted by her psychiatrist Dr. Evelene Croon. She was started topamax but stopped that due to lethargy. Yesterday she was switched from ativan 1 mg TID and 2 mg and bed time to Klonapin 1 mg po qid. this was done but her insurance coverage issues. In AM am she got up and could not walk she had slurred speech and had trouble ambulating. The weakness was generalized no localized symptoms. CVA work up has been done and MRI of the head was unremarkable. Patient is a bit confused about what she should be taking.   Hospital Course:   Confusion and gait instability likely secondary to benzodiazepine overdose and polypharmacy.  Her MRI brain was negative for stroke and there for not signs of underlying infection.  Her TSH was wnl.  Spoke at length with  husband and contacted Dr. Evelene Croon and attempted to speak with her pain specialist at [facility] but was not able to reach him.  She has not recently taken nortriptyline nor topamax due to sedation. Her clonazepam dose was likely too high in the setting of methadone use and after reducing the dose, her mentation and gait improved.  Would not stop it altogether given risk of benzodiazepine withdrawal.  She was seen by PT/OT who recommended outpatient therapy.  She will need 24 hour assistance at home for at least the next few days and until her mentation and strength have returned to normal.    Hypokalemia, mild, may be due to poor diet. Magnesium wnl. Oral KCl x 1   Mild normocytic anemia, likely marrow suppression from acute illness.  Repeat CBC in 2 weeks by PCP and further work up for anemia as indicated.  Anxiety, recommend follow up appointment today at 1:30.  Consider addition of SSRI/SNRI if not already tried and confirm dose of clonazepam.  Chronic pain, continue methadone   Dysphagia, concern for aspiration initially, however, as her mentation improved, she was able to eat a regular diet.  She was seen by speech therapy who verified she was at low risk of aspiration.    Constipation relieved with magnesium citrate and enemia.  She should continue aggressive stool softeners such as miralax and some stimulant medication at home due to her ongoing methadone use.    Consultants:  None Procedures:  CT brain  MRI brain Antibiotics:  none   Discharge  Exam: Filed Vitals:   05/25/13 2159  BP: 131/100  Pulse: 89  Temp: 97.9 F (36.6 C)  Resp: 16   Filed Vitals:   05/24/13 2302 05/25/13 0500 05/25/13 1409 05/25/13 2159  BP: 134/83 137/83 132/80 131/100  Pulse: 87 89 93 89  Temp:  97.4 F (36.3 C) 97.9 F (36.6 C) 97.9 F (36.6 C)  TempSrc:   Axillary Oral  Resp: 16 20 19 16   Height:      Weight:      SpO2: 98% 98% 100% 98%    General: CF, No acute distress, alert and  pleasant HEENT: NCAT, MMM  Cardiovascular: RRR, nl S1, S2 no mrg, 2+ pulses, warm extremities  Respiratory: CTAB, no increased WOB  Abdomen: NABS, soft, ND, very TTP in the RLQ without rebound or guarding  MSK: Normal tone and bulk, no LEE  Neuro: Grossly intact.  Psych:  Thinking is more organized today and she is better able to carry on conversation.  She is more articulate    Discharge Instructions      Discharge Orders   Future Orders Complete By Expires   Call MD for:  difficulty breathing, headache or visual disturbances  As directed    Call MD for:  extreme fatigue  As directed    Call MD for:  hives  As directed    Call MD for:  persistant dizziness or light-headedness  As directed    Call MD for:  persistant nausea and vomiting  As directed    Call MD for:  severe uncontrolled pain  As directed    Call MD for:  temperature >100.4  As directed    Diet general  As directed    Discharge instructions  As directed    Comments:     You were hospitalized with confusion and difficulty walking which may have been due to medication side effect from clonazepam.  Please reduce your dose of clonazepam to 0.25mg  three times a day AS NEEDED.  We have arranged for someone to stay with you and you will have home health physical/occupational therapy and a nurse visit you.  Please see your psychiatrist today at 1:30PM to verify your medications.  Please bring your paperwork from this admission with you.  Make sure you take your medication only as prescribed.   Driving Restrictions  As directed    Comments:     No driving, operating heavy machinery, swimming unsupervised, or babysitting until your thinking has returned to normal AND  Your doctor says it is okay.   Increase activity slowly  As directed        Medication List    STOP taking these medications       nortriptyline 50 MG capsule  Commonly known as:  PAMELOR      TAKE these medications       clonazePAM 0.5 MG tablet   Commonly known as:  KLONOPIN  Take 0.5 tablets (0.25 mg total) by mouth 3 (three) times daily as needed (anxiety).     methadone 5 MG tablet  Commonly known as:  DOLOPHINE  Take 5 mg by mouth 4 (four) times daily.     traZODone 100 MG tablet  Commonly known as:  DESYREL  Take 100 mg by mouth 3 (three) times daily.       Follow-up Information   Follow up with Glori Bickers, MD On 05/26/2013. (1:30PM)    Specialty:  Psychiatry   Contact information:   [address]  [address].[place] [address] [place] [address] [phone]        The results of significant diagnostics from this hospitalization (including imaging, microbiology, ancillary and laboratory) are listed below for reference.    Significant Diagnostic Studies: Ct Head Wo Contrast  05/24/2013   CLINICAL DATA:  Altered mental status, weakness, aphasia, slurred speech  EXAM: CT HEAD WITHOUT CONTRAST  TECHNIQUE: Contiguous axial images were obtained from the base of the skull through the vertex without intravenous contrast.  COMPARISON:  04/02/2009  FINDINGS: No evidence of parenchymal hemorrhage or extra-axial fluid collection. No mass lesion, mass effect, or midline shift.  No CT evidence of acute infarction.  Cerebral volume is within normal limits.  No ventriculomegaly.  The visualized paranasal sinuses are essentially clear. The mastoid air cells are unopacified.  No evidence of calvarial fracture.  IMPRESSION: No evidence of acute intracranial abnormality.   Electronically Signed   By: Charline Bills M.D.   On: 05/24/2013 12:43   Mr Brain Wo Contrast  05/24/2013   CLINICAL DATA:  Altered mental status. Aphasia. Gait instability and generalized weakness.  EXAM: MRI HEAD WITHOUT CONTRAST  TECHNIQUE: Multiplanar, multiecho pulse sequences of the brain and surrounding structures were obtained without intravenous contrast.  COMPARISON:  CT head from the same day.  MRI brain 01/08/2008.  FINDINGS: The study is  mildly degraded by patient motion. No acute infarct, hemorrhage, or mass lesion is present. Mild atrophy and minimal white matter disease is likely within normal limits for age. The ventricles are of normal size. No significant extra-axial fluid collection is present.  Flow is present in the major intracranial arteries. The globes and orbits are intact. Minimal mucosal thickening is present in the anterior ethmoid air cells. There is some fluid in the right mastoid air cells as well. No obstructing nasopharyngeal lesion is present.  IMPRESSION: 1. Normal MRI appearance of the brain for age. 2. Minimal sinus disease. 3. Minimal deep and fluid in the right mastoid air cells is chronic. No obstructing nasopharyngeal lesion is evident.   Electronically Signed   By: Gennette Pac M.D.   On: 05/24/2013 15:16    Microbiology: No results found for this or any previous visit (from the past 240 hour(s)).   Labs: Basic Metabolic Panel:  Recent Labs Lab 05/24/13 1209 05/24/13 1221 05/24/13 1229 05/24/13 1318 05/24/13 1930 05/25/13 0526  NA 136 141 140  --  137 139  K 3.7 2.6* 2.6* 2.7* 3.3* 3.4*  CL 91* 89* 89*  --  100 106  CO2 37*  --   --   --  30 25  GLUCOSE 97 95 91  --  100* 86  BUN 8 7 7   --  6 5*  CREATININE 0.64 0.90 0.90  --  0.52 0.54  CALCIUM 9.8  --   --   --  9.0 9.3  MG 3.2*  --   --   --   --  2.1  PHOS  --   --   --   --   --  2.4   Liver Function Tests:  Recent Labs Lab 05/24/13 1209 05/25/13 0526  AST 59* 28  ALT 28 20  ALKPHOS 106 102  BILITOT 0.5 0.4  PROT 7.6 6.3  ALBUMIN 4.1 3.5   No results found for this basename: LIPASE, AMYLASE,  in the last 168 hours No results found for this basename: AMMONIA,  in the last 168 hours CBC:  Recent Labs Lab 05/24/13 1209  05/24/13 1221 05/24/13 1229 05/25/13 0526  WBC 4.9  --   --  4.4  NEUTROABS 2.6  --   --   --   HGB 12.9 13.9 13.9 11.0*  HCT 38.4 41.0 41.0 33.3*  MCV 88.3  --   --  88.8  PLT 196  --   --  163    Cardiac Enzymes:  Recent Labs Lab 05/24/13 1209  TROPONINI <0.30   BNP: BNP (last 3 results) No results found for this basename: PROBNP,  in the last 8760 hours CBG:  Recent Labs Lab 05/24/13 1156  GLUCAP 92    Time coordinating discharge: 45 minutes  Signed:  Aniket Paye  [facility] 05/26/2013, 10:48 AM

## 2013-05-27 NOTE — ED Provider Notes (Signed)
Medical screening examination/treatment/procedure(s) were performed by non-physician practitioner and as supervising physician I was immediately available for consultation/collaboration.  EKG Interpretation     Ventricular Rate:    PR Interval:    QRS Duration:   QT Interval:    QTC Calculation:   R Axis:     Text Interpretation:                Rolan Bucco, MD 05/27/13 1101

## 2013-05-31 NOTE — ED Provider Notes (Signed)
Medical screening examination/treatment/procedure(s) were conducted as a shared visit with non-physician practitioner(s) and myself.  I personally evaluated the patient during the encounter.  EKG Interpretation     Ventricular Rate:    PR Interval:    QRS Duration:   QT Interval:    QTC Calculation:   R Axis:     Text Interpretation:               Toy Baker, MD 05/31/13 2122

## 2013-07-31 DIAGNOSIS — K5900 Constipation, unspecified: Secondary | ICD-10-CM | POA: Diagnosis not present

## 2013-09-23 DIAGNOSIS — R300 Dysuria: Secondary | ICD-10-CM | POA: Diagnosis not present

## 2013-09-23 DIAGNOSIS — R109 Unspecified abdominal pain: Secondary | ICD-10-CM | POA: Diagnosis not present

## 2013-10-01 DIAGNOSIS — K5900 Constipation, unspecified: Secondary | ICD-10-CM | POA: Diagnosis not present

## 2013-10-01 DIAGNOSIS — G894 Chronic pain syndrome: Secondary | ICD-10-CM | POA: Diagnosis not present

## 2013-10-01 DIAGNOSIS — R109 Unspecified abdominal pain: Secondary | ICD-10-CM | POA: Diagnosis not present

## 2013-10-02 ENCOUNTER — Encounter (HOSPITAL_BASED_OUTPATIENT_CLINIC_OR_DEPARTMENT_OTHER): Payer: Self-pay | Admitting: Emergency Medicine

## 2013-10-02 ENCOUNTER — Emergency Department (HOSPITAL_BASED_OUTPATIENT_CLINIC_OR_DEPARTMENT_OTHER)
Admission: EM | Admit: 2013-10-02 | Discharge: 2013-10-02 | Disposition: A | Discharge status: Home / Self Care | Payer: Medicare Other | Attending: Emergency Medicine | Admitting: Emergency Medicine

## 2013-10-02 DIAGNOSIS — F411 Generalized anxiety disorder: Secondary | ICD-10-CM | POA: Diagnosis not present

## 2013-10-02 DIAGNOSIS — Z9089 Acquired absence of other organs: Secondary | ICD-10-CM | POA: Diagnosis not present

## 2013-10-02 DIAGNOSIS — G8929 Other chronic pain: Secondary | ICD-10-CM | POA: Diagnosis not present

## 2013-10-02 DIAGNOSIS — K5900 Constipation, unspecified: Secondary | ICD-10-CM | POA: Diagnosis not present

## 2013-10-02 DIAGNOSIS — Z79899 Other long term (current) drug therapy: Secondary | ICD-10-CM | POA: Diagnosis not present

## 2013-10-02 DIAGNOSIS — G894 Chronic pain syndrome: Secondary | ICD-10-CM | POA: Diagnosis not present

## 2013-10-02 HISTORY — DX: Constipation, unspecified: K59.00

## 2013-10-02 NOTE — ED Notes (Signed)
NP at bedside.

## 2013-10-02 NOTE — ED Provider Notes (Signed)
CSN: [id number]     Arrival date & time 10/02/13  1749 History   First MD Initiated Contact with Patient 10/02/13 1809     Chief Complaint  Patient presents with  . Constipation     (Consider location/radiation/quality/duration/timing/severity/associated sxs/prior Treatment) HPI Comments: Pt states that she has been seen by pcp over the last week because she has not had a bm in the last 2 weeks. Pt states that she has tried multiple prescription and otc medications. Pt [facility]. Pt states that she has this problem because of her methadone.  The history is provided by the patient. No language interpreter was used.    Past Medical History  Diagnosis Date  . GERD (gastroesophageal reflux disease)   . Anxiety   . Chronic pain   . Constipation    Past Surgical History  Procedure Laterality Date  . Appendectomy    . Bladder surgery      Mesh implanted  . Shoulder surgery      X 2  . Tonsillectomy    . Abdominal wall mesh  removal     Family History  Problem Relation Age of Onset  . Coronary artery disease Father 34  . Coronary artery disease Mother 52   History  Substance Use Topics  . Smoking status: Never Smoker   . Smokeless tobacco: Not on file  . Alcohol Use: No   OB History   Grav Para Term Preterm Abortions TAB SAB Ect Mult Living                 Review of Systems  Constitutional: Negative.   Respiratory: Negative.   Cardiovascular: Negative.       Allergies  Ativan and Sulfa antibiotics  Home Medications   Current Outpatient Rx  Name  Route  Sig  Dispense  Refill  . ALPRAZolam (XANAX) 1 MG tablet   Oral   Take 1 mg by mouth 4 (four) times daily.         Marland Kitchen imipramine (TOFRANIL) 50 MG tablet   Oral   Take 100 mg by mouth at bedtime.         Marland Kitchen zolpidem (AMBIEN) 10 MG tablet   Oral   Take 10 mg by mouth at bedtime as needed for sleep.         . clonazePAM (KLONOPIN) 0.5 MG tablet    Oral   Take 0.5 tablets (0.25 mg total) by mouth 3 (three) times daily as needed (anxiety).   45 tablet   0   . methadone (DOLOPHINE) 5 MG tablet   Oral   Take 5 mg by mouth 4 (four) times daily.         . traZODone (DESYREL) 100 MG tablet   Oral   Take 100 mg by mouth 3 (three) times daily.          BP 123/85  Pulse 84  Temp(Src) 98.2 F (36.8 C) (Oral)  SpO2 96% Physical Exam  Nursing note and vitals reviewed. Constitutional: She is oriented to person, place, and time. She appears well-developed and well-nourished.  Cardiovascular: Normal rate and regular rhythm.   Pulmonary/Chest: Effort normal and breath sounds normal.  Genitourinary:  No stool in rectal vault  Musculoskeletal: Normal range of motion.  Neurological: She is alert and oriented to person, place, and time.  Skin: Skin is warm and dry.    ED Course  Procedures (including critical care  time) Labs Review Labs Reviewed - No data to display Imaging Review No results found.   EKG Interpretation None      MDM   Final diagnoses:  Constipation    Record obtained from [facility] no mention of constipation noted on x-ray also without any acute findings. Pt had a soft bowel movement while here. Pt doesn't have impaction on exam:no medications needed at this time   Teressa LowerVrinda Zyrion Coey, NP 10/02/13 1955

## 2013-10-02 NOTE — Discharge Instructions (Signed)
Constipation, Adult  Constipation is when a person:  · Poops (bowel movement) less than 3 times a week.  · Has a hard time pooping.  · Has poop that is dry, hard, or bigger than normal.  HOME CARE   · Eat more fiber, such as fruits, vegetables, whole grains like Curless rice, and beans.  · Eat less fatty foods and sugar. This includes French fries, hamburgers, cookies, candy, and soda.  · If you are not getting enough fiber from food, take products with added fiber in them (supplements).  · Drink enough fluid to keep your pee (urine) clear or pale yellow.  · Go to the restroom when you feel like you need to poop. Do not hold it.  · Only take medicine as told by your doctor. Do not take medicines that help you poop (laxatives) without talking to your doctor first.  · Exercise on a regular basis, or as told by your doctor.  GET HELP RIGHT AWAY IF:   · You have bright red blood in your poop (stool).  · Your constipation lasts more than 4 days or gets worse.  · You have belly (abdomen) or butt (rectal) pain.  · You have thin poop (as thin as a pencil).  · You lose weight, and it cannot be explained.  MAKE SURE YOU:   · Understand these instructions.  · Will watch your condition.  · Will get help right away if you are not doing well or get worse.  Document Released: 12/19/2007 Document Revised: 09/24/2011 Document Reviewed: 04/13/2013  [employer]® Patient Information ©2014 [employer].

## 2013-10-02 NOTE — ED Notes (Signed)
Pt had semiformed stool with digested food.

## 2013-10-02 NOTE — ED Notes (Signed)
Pt sts she has not had a "good bowel movement" in a couple weeks.  Has been taking multiple OTC meds without results.  Sent from [facility] for eval.  Seen at [facility] yesterday with abd film done that showed no obstruction. Long hx of constipation r/t Methodone use.

## 2013-10-02 NOTE — ED Notes (Signed)
Pt ambulatory to restroom

## 2013-10-03 NOTE — ED Provider Notes (Signed)
Medical screening examination/treatment/procedure(s) were performed by non-physician practitioner and as supervising physician I was immediately available for consultation/collaboration.   EKG Interpretation None        Junius Argyle, MD 10/03/13 [phone]

## 2013-10-12 DIAGNOSIS — K5900 Constipation, unspecified: Secondary | ICD-10-CM | POA: Diagnosis not present

## 2013-10-31 DIAGNOSIS — G43009 Migraine without aura, not intractable, without status migrainosus: Secondary | ICD-10-CM | POA: Diagnosis not present

## 2013-11-01 ENCOUNTER — Emergency Department (HOSPITAL_BASED_OUTPATIENT_CLINIC_OR_DEPARTMENT_OTHER)
Admission: EM | Admit: 2013-11-01 | Discharge: 2013-11-01 | Disposition: A | Discharge status: Home / Self Care | Payer: Medicare Other | Attending: Emergency Medicine | Admitting: Emergency Medicine

## 2013-11-01 ENCOUNTER — Encounter (HOSPITAL_BASED_OUTPATIENT_CLINIC_OR_DEPARTMENT_OTHER): Payer: Self-pay | Admitting: Emergency Medicine

## 2013-11-01 DIAGNOSIS — G43909 Migraine, unspecified, not intractable, without status migrainosus: Secondary | ICD-10-CM | POA: Diagnosis not present

## 2013-11-01 DIAGNOSIS — K219 Gastro-esophageal reflux disease without esophagitis: Secondary | ICD-10-CM | POA: Diagnosis not present

## 2013-11-01 DIAGNOSIS — Z79899 Other long term (current) drug therapy: Secondary | ICD-10-CM | POA: Diagnosis not present

## 2013-11-01 DIAGNOSIS — G8929 Other chronic pain: Secondary | ICD-10-CM | POA: Insufficient documentation

## 2013-11-01 DIAGNOSIS — F411 Generalized anxiety disorder: Secondary | ICD-10-CM | POA: Insufficient documentation

## 2013-11-01 DIAGNOSIS — Z8719 Personal history of other diseases of the digestive system: Secondary | ICD-10-CM | POA: Insufficient documentation

## 2013-11-01 HISTORY — DX: Migraine, unspecified, not intractable, without status migrainosus: G43.909

## 2013-11-01 MED ORDER — METOCLOPRAMIDE HCL 5 MG/ML IJ SOLN
10.0000 mg | Freq: Once | INTRAMUSCULAR | Status: AC
  Administered 2013-11-01: 10 mg via INTRAVENOUS
  Filled 2013-11-01: qty 2

## 2013-11-01 MED ORDER — DIPHENHYDRAMINE HCL 50 MG/ML IJ SOLN
25.0000 mg | Freq: Once | INTRAMUSCULAR | Status: AC
Start: 2013-11-01 — End: 2013-11-01
  Administered 2013-11-01: 25 mg via INTRAVENOUS
  Filled 2013-11-01: qty 1

## 2013-11-01 MED ORDER — DEXAMETHASONE SODIUM PHOSPHATE 10 MG/ML IJ SOLN
10.0000 mg | Freq: Once | INTRAMUSCULAR | Status: AC
  Administered 2013-11-01: 10 mg via INTRAVENOUS
  Filled 2013-11-01: qty 1

## 2013-11-01 NOTE — Discharge Instructions (Signed)
Migraine Headache A migraine headache is an intense, throbbing pain on one or both sides of your head. A migraine can last for 30 minutes to several hours. CAUSES  The exact cause of a migraine headache is not always known. However, a migraine may be caused when nerves in the brain become irritated and release chemicals that cause inflammation. This causes pain. Certain things may also trigger migraines, such as:  Alcohol.  Smoking.  Stress.  Menstruation.  Aged cheeses.  Foods or drinks that contain nitrates, glutamate, aspartame, or tyramine.  Lack of sleep.  Chocolate.  Caffeine.  Hunger.  Physical exertion.  Fatigue.  Medicines used to treat chest pain (nitroglycerine), birth control pills, estrogen, and some blood pressure medicines. SIGNS AND SYMPTOMS  Pain on one or both sides of your head.  Pulsating or throbbing pain.  Severe pain that prevents daily activities.  Pain that is aggravated by any physical activity.  Nausea, vomiting, or both.  Dizziness.  Pain with exposure to bright lights, loud noises, or activity.  General sensitivity to bright lights, loud noises, or smells. Before you get a migraine, you may get warning signs that a migraine is coming (aura). An aura may include:  Seeing flashing lights.  Seeing bright spots, halos, or zig-zag lines.  Having tunnel vision or blurred vision.  Having feelings of numbness or tingling.  Having trouble talking.  Having muscle weakness. DIAGNOSIS  A migraine headache is often diagnosed based on:  Symptoms.  Physical exam.  A CT scan or MRI of your head. These imaging tests cannot diagnose migraines, but they can help rule out other causes of headaches. TREATMENT Medicines may be given for pain and nausea. Medicines can also be given to help prevent recurrent migraines.  HOME CARE INSTRUCTIONS  Only take over-the-counter or prescription medicines for pain or discomfort as directed by your  health care provider. The use of long-term narcotics is not recommended.  Lie down in a dark, quiet room when you have a migraine.  Keep a journal to find out what may trigger your migraine headaches. For example, write down:  What you eat and drink.  How much sleep you get.  Any change to your diet or medicines.  Limit alcohol consumption.  Quit smoking if you smoke.  Get 7 9 hours of sleep, or as recommended by your health care provider.  Limit stress.  Keep lights dim if bright lights bother you and make your migraines worse. SEEK IMMEDIATE MEDICAL CARE IF:   Your migraine becomes severe.  You have a fever.  You have a stiff neck.  You have vision loss.  You have muscular weakness or loss of muscle control.  You start losing your balance or have trouble walking.  You feel faint or pass out.  You have severe symptoms that are different from your first symptoms. MAKE SURE YOU:   Understand these instructions.  Will watch your condition.  Will get help right away if you are not doing well or get worse. Document Released: 07/02/2005 Document Revised: 04/22/2013 Document Reviewed: 03/09/2013 [employer] Patient Information 2014 [employer].  

## 2013-11-01 NOTE — ED Provider Notes (Signed)
CSN: [id number]     Arrival date & time 11/01/13  1101 History   First MD Initiated Contact with Patient 11/01/13 1209     Chief Complaint  Patient presents with  . Migraine     (Consider location/radiation/quality/duration/timing/severity/associated sxs/prior Treatment) HPI Comments: Hx of R sided temporal migraines since her 30's. Reports 4 days of migraine c/w all prior migraines. Denies fever, chills, numbness, weakness, confusion.   Patient is a 68 y.o. female presenting with migraines. The history is provided by the patient. No language interpreter was used.  Migraine This is a recurrent problem. The current episode started more than 2 days ago. The problem occurs constantly. The problem has not changed since onset.Associated symptoms include headaches. Pertinent negatives include no chest pain, no abdominal pain and no shortness of breath. Exacerbated by: light. The symptoms are relieved by lying down (dark). She has tried rest and water for the symptoms. The treatment provided no relief.    Past Medical History  Diagnosis Date  . GERD (gastroesophageal reflux disease)   . Anxiety   . Chronic pain   . Constipation   . Migraine    Past Surgical History  Procedure Laterality Date  . Appendectomy    . Bladder surgery      Mesh implanted  . Shoulder surgery      X 2  . Tonsillectomy    . Abdominal wall mesh  removal     Family History  Problem Relation Age of Onset  . Coronary artery disease Father 565  . Coronary artery disease Mother 6070   History  Substance Use Topics  . Smoking status: Never Smoker   . Smokeless tobacco: Not on file  . Alcohol Use: No   OB History   Grav Para Term Preterm Abortions TAB SAB Ect Mult Living                 Review of Systems  Constitutional: Negative for fever, chills, diaphoresis, activity change, appetite change and fatigue.  HENT: Negative for congestion, facial swelling, rhinorrhea and sore throat.   Eyes: Negative for  photophobia and discharge.  Respiratory: Negative for cough, chest tightness and shortness of breath.   Cardiovascular: Negative for chest pain, palpitations and leg swelling.  Gastrointestinal: Positive for nausea. Negative for vomiting, abdominal pain and diarrhea.  Endocrine: Negative for polydipsia and polyuria.  Genitourinary: Negative for dysuria, frequency, difficulty urinating and pelvic pain.  Musculoskeletal: Negative for arthralgias, back pain, neck pain and neck stiffness.  Skin: Negative for color change and wound.  Allergic/Immunologic: Negative for immunocompromised state.  Neurological: Positive for headaches. Negative for facial asymmetry, weakness and numbness.  Hematological: Does not bruise/bleed easily.  Psychiatric/Behavioral: Negative for confusion and agitation.      Allergies  Ativan and Sulfa antibiotics  Home Medications   Prior to Admission medications   Medication Sig Start Date End Date Taking? Authorizing Provider  omeprazole (PRILOSEC) 20 MG capsule Take 20 mg by mouth daily.   Yes Historical Provider, MD  ALPRAZolam Prudy Feeler(XANAX) 1 MG tablet Take 1 mg by mouth 4 (four) times daily.    Historical Provider, MD  clonazePAM (KLONOPIN) 0.5 MG tablet Take 0.5 tablets (0.25 mg total) by mouth 3 (three) times daily as needed (anxiety). 05/26/13   Renae FickleMackenzie Short, MD  imipramine (TOFRANIL) 50 MG tablet Take 100 mg by mouth at bedtime.    Historical Provider, MD  methadone (DOLOPHINE) 5 MG tablet Take 5 mg by mouth 4 (four) times daily.  Historical Provider, MD  traZODone (DESYREL) 100 MG tablet Take 100 mg by mouth 3 (three) times daily.    Historical Provider, MD  zolpidem (AMBIEN) 10 MG tablet Take 10 mg by mouth at bedtime as needed for sleep.    Historical Provider, MD   BP 153/85  Pulse 82  Temp(Src) 98 F (36.7 C) (Oral)  Resp 16  Ht 5' 3.5" (1.613 m)  Wt 116 lb (52.617 kg)  BMI 20.22 kg/m2  SpO2 98% Physical Exam  Constitutional: She is oriented to  person, place, and time. She appears well-developed and well-nourished. No distress.  HENT:  Head: Normocephalic and atraumatic.  Mouth/Throat: No oropharyngeal exudate.  Eyes: Pupils are equal, round, and reactive to light.  Neck: Normal range of motion. Neck supple.  Cardiovascular: Normal rate, regular rhythm and normal heart sounds.  Exam reveals no gallop and no friction rub.   No murmur heard. Pulmonary/Chest: Effort normal and breath sounds normal. No respiratory distress. She has no wheezes. She has no rales.  Abdominal: Soft. Bowel sounds are normal. She exhibits no distension and no mass. There is no tenderness. There is no rebound and no guarding.  Musculoskeletal: Normal range of motion. She exhibits no edema and no tenderness.  Neurological: She is alert and oriented to person, place, and time. She has normal strength. She displays no tremor. No cranial nerve deficit or sensory deficit. She exhibits normal muscle tone. She displays a negative Romberg sign. Coordination and gait normal. GCS eye subscore is 4. GCS verbal subscore is 5. GCS motor subscore is 6.  Skin: Skin is warm and dry.  Psychiatric: She has a normal mood and affect.    ED Course  Procedures (including critical care time) Labs Review Labs Reviewed - No data to display  Imaging Review No results found.   EKG Interpretation None      MDM   Final diagnoses:  Migraine    Pt is a 68 y.o. female with Pmhx as above who presents with 4 days of R temporal throbbing h/a with assoc nausea, photophobia, similar to prior migraines which she has had since her 30's.  Denies fever, chills, vomiting, neck pain/stiffness, numbness, weakness, confusion. No anticoagulant use. On PE, VSS, pt in NAD. Neuro exam unremarkable. She reports good success with migraine cocktail in the past. Doubt CVA/TIA, meningitis, SAH. Migraine cocktail given with much improvement of symptoms. Will d/c home with Return precautions given for  new or worsening symptoms including worsening pain, fever, focal neuro complaints.        Shanna Cisco, MD 11/01/13 [phone]

## 2013-11-01 NOTE — ED Notes (Signed)
Patient complains of migraine headache since Wednesday. Pain more at right temporal lobe, denies trauma. Nausea and photophobia, hx of same

## 2013-11-01 NOTE — ED Notes (Signed)
PIV removed prior to discharge.  No difficulty.

## 2013-11-01 NOTE — ED Notes (Signed)
22 gauge PIV to right AC initiated prior to my assuming care.  Medications administered through this line.

## 2013-11-02 DIAGNOSIS — G43009 Migraine without aura, not intractable, without status migrainosus: Secondary | ICD-10-CM | POA: Diagnosis not present

## 2013-11-03 DIAGNOSIS — G43109 Migraine with aura, not intractable, without status migrainosus: Secondary | ICD-10-CM | POA: Diagnosis not present

## 2013-11-03 DIAGNOSIS — G43009 Migraine without aura, not intractable, without status migrainosus: Secondary | ICD-10-CM | POA: Diagnosis not present

## 2013-12-19 ENCOUNTER — Emergency Department (HOSPITAL_BASED_OUTPATIENT_CLINIC_OR_DEPARTMENT_OTHER)
Admission: EM | Admit: 2013-12-19 | Discharge: 2013-12-19 | Disposition: A | Discharge status: Home / Self Care | Payer: Medicare Other | Attending: Emergency Medicine | Admitting: Emergency Medicine

## 2013-12-19 ENCOUNTER — Emergency Department (HOSPITAL_BASED_OUTPATIENT_CLINIC_OR_DEPARTMENT_OTHER): Payer: Medicare Other

## 2013-12-19 ENCOUNTER — Encounter (HOSPITAL_BASED_OUTPATIENT_CLINIC_OR_DEPARTMENT_OTHER): Payer: Self-pay | Admitting: Emergency Medicine

## 2013-12-19 DIAGNOSIS — M2669 Other specified disorders of temporomandibular joint: Secondary | ICD-10-CM | POA: Diagnosis not present

## 2013-12-19 DIAGNOSIS — K219 Gastro-esophageal reflux disease without esophagitis: Secondary | ICD-10-CM | POA: Insufficient documentation

## 2013-12-19 DIAGNOSIS — G43909 Migraine, unspecified, not intractable, without status migrainosus: Secondary | ICD-10-CM | POA: Insufficient documentation

## 2013-12-19 DIAGNOSIS — R519 Headache, unspecified: Secondary | ICD-10-CM

## 2013-12-19 DIAGNOSIS — Z79899 Other long term (current) drug therapy: Secondary | ICD-10-CM | POA: Insufficient documentation

## 2013-12-19 DIAGNOSIS — G8929 Other chronic pain: Secondary | ICD-10-CM | POA: Insufficient documentation

## 2013-12-19 DIAGNOSIS — R51 Headache: Secondary | ICD-10-CM

## 2013-12-19 DIAGNOSIS — R42 Dizziness and giddiness: Secondary | ICD-10-CM | POA: Diagnosis not present

## 2013-12-19 DIAGNOSIS — R110 Nausea: Secondary | ICD-10-CM | POA: Insufficient documentation

## 2013-12-19 DIAGNOSIS — F411 Generalized anxiety disorder: Secondary | ICD-10-CM | POA: Insufficient documentation

## 2013-12-19 DIAGNOSIS — M316 Other giant cell arteritis: Secondary | ICD-10-CM | POA: Insufficient documentation

## 2013-12-19 DIAGNOSIS — IMO0002 Reserved for concepts with insufficient information to code with codable children: Secondary | ICD-10-CM | POA: Diagnosis not present

## 2013-12-19 LAB — SEDIMENTATION RATE: Sed Rate: 10 mm/hr (ref 0–22)

## 2013-12-19 MED ORDER — MORPHINE SULFATE 10 MG/ML IJ SOLN
10.0000 mg | Freq: Once | INTRAMUSCULAR | Status: AC
  Administered 2013-12-19: 10 mg via INTRAMUSCULAR
  Filled 2013-12-19: qty 1

## 2013-12-19 MED ORDER — METHYLPREDNISOLONE 4 MG PO KIT: PACK | ORAL | Status: DC

## 2013-12-19 MED ORDER — DEXAMETHASONE SODIUM PHOSPHATE 10 MG/ML IJ SOLN
10.0000 mg | Freq: Once | INTRAMUSCULAR | Status: AC
  Administered 2013-12-19: 10 mg via INTRAMUSCULAR
  Filled 2013-12-19: qty 1

## 2013-12-19 MED ORDER — PROMETHAZINE HCL 25 MG/ML IJ SOLN
25.0000 mg | Freq: Once | INTRAMUSCULAR | Status: AC
  Administered 2013-12-19: 25 mg via INTRAMUSCULAR
  Filled 2013-12-19: qty 1

## 2013-12-19 MED ORDER — DEXAMETHASONE SODIUM PHOSPHATE 10 MG/ML IJ SOLN: 10.0000 mg | Freq: Once | INTRAMUSCULAR | Status: DC

## 2013-12-19 MED ORDER — OXYCODONE HCL 5 MG PO TABS: 5.0000 mg | ORAL_TABLET | ORAL | Status: DC | PRN

## 2013-12-19 NOTE — Discharge Instructions (Signed)
Call Neurologist for follow up appointment. Your blood test for Arteritis is negative. If your headaches persist, you may need additional testing to rule out arteritis. (A biopsy).  Headaches, Frequently Asked Questions MIGRAINE HEADACHES Q: What is migraine? What causes it? How can I treat it? A: Generally, migraine headaches begin as a dull ache. Then they develop into a constant, throbbing, and pulsating pain. You may experience pain at the temples. You may experience pain at the front or back of one or both sides of the head. The pain is usually accompanied by a combination of:  Nausea.  Vomiting.  Sensitivity to light and noise. Some people (about 15%) experience an aura (see below) before an attack. The cause of migraine is believed to be chemical reactions in the brain. Treatment for migraine may include over-the-counter or prescription medications. It may also include self-help techniques. These include relaxation training and biofeedback.  Q: What is an aura? A: About 15% of people with migraine get an "aura". This is a sign of neurological symptoms that occur before a migraine headache. You may see wavy or jagged lines, dots, or flashing lights. You might experience tunnel vision or blind spots in one or both eyes. The aura can include visual or auditory hallucinations (something imagined). It may include disruptions in smell (such as strange odors), taste or touch. Other symptoms include:  Numbness.  A "pins and needles" sensation.  Difficulty in recalling or speaking the correct word. These neurological events may last as long as 60 minutes. These symptoms will fade as the headache begins. Q: What is a trigger? A: Certain physical or environmental factors can lead to or "trigger" a migraine. These include:  Foods.  Hormonal changes.  Weather.  Stress. It is important to remember that triggers are different for everyone. To help prevent migraine attacks, you need to  figure out which triggers affect you. Keep a headache diary. This is a good way to track triggers. The diary will help you talk to your healthcare professional about your condition. Q: Does weather affect migraines? A: Bright sunshine, hot, humid conditions, and drastic changes in barometric pressure may lead to, or "trigger," a migraine attack in some people. But studies have shown that weather does not act as a trigger for everyone with migraines. Q: What is the link between migraine and hormones? A: Hormones start and regulate many of your body's functions. Hormones keep your body in balance within a constantly changing environment. The levels of hormones in your body are unbalanced at times. Examples are during menstruation, pregnancy, or menopause. That can lead to a migraine attack. In fact, about three quarters of all women with migraine report that their attacks are related to the menstrual cycle.  Q: Is there an increased risk of stroke for migraine sufferers? A: The likelihood of a migraine attack causing a stroke is very remote. That is not to say that migraine sufferers cannot have a stroke associated with their migraines. In persons under age 69, the most common associated factor for stroke is migraine headache. But over the course of a person's normal life span, the occurrence of migraine headache may actually be associated with a reduced risk of dying from cerebrovascular disease due to stroke.  Q: What are acute medications for migraine? A: Acute medications are used to treat the pain of the headache after it has started. Examples over-the-counter medications, NSAIDs, ergots, and triptans.  Q: What are the triptans? A: Triptans are the newest class of abortive  medications. They are specifically targeted to treat migraine. Triptans are vasoconstrictors. They moderate some chemical reactions in the brain. The triptans work on receptors in your brain. Triptans help to restore the balance of a  neurotransmitter called serotonin. Fluctuations in levels of serotonin are thought to be a main cause of migraine.  Q: Are over-the-counter medications for migraine effective? A: Over-the-counter, or "OTC," medications may be effective in relieving mild to moderate pain and associated symptoms of migraine. But you should see your caregiver before beginning any treatment regimen for migraine.  Q: What are preventive medications for migraine? A: Preventive medications for migraine are sometimes referred to as "prophylactic" treatments. They are used to reduce the frequency, severity, and length of migraine attacks. Examples of preventive medications include antiepileptic medications, antidepressants, beta-blockers, calcium channel blockers, and NSAIDs (nonsteroidal anti-inflammatory drugs). Q: Why are anticonvulsants used to treat migraine? A: During the past few years, there has been an increased interest in antiepileptic drugs for the prevention of migraine. They are sometimes referred to as "anticonvulsants". Both epilepsy and migraine may be caused by similar reactions in the brain.  Q: Why are antidepressants used to treat migraine? A: Antidepressants are typically used to treat people with depression. They may reduce migraine frequency by regulating chemical levels, such as serotonin, in the brain.  Q: What alternative therapies are used to treat migraine? A: The term "alternative therapies" is often used to describe treatments considered outside the scope of conventional Western medicine. Examples of alternative therapy include acupuncture, acupressure, and yoga. Another common alternative treatment is herbal therapy. Some herbs are believed to relieve headache pain. Always discuss alternative therapies with your caregiver before proceeding. Some herbal products contain arsenic and other toxins. TENSION HEADACHES Q: What is a tension-type headache? What causes it? How can I treat it? A:  Tension-type headaches occur randomly. They are often the result of temporary stress, anxiety, fatigue, or anger. Symptoms include soreness in your temples, a tightening band-like sensation around your head (a "vice-like" ache). Symptoms can also include a pulling feeling, pressure sensations, and contracting head and neck muscles. The headache begins in your forehead, temples, or the back of your head and neck. Treatment for tension-type headache may include over-the-counter or prescription medications. Treatment may also include self-help techniques such as relaxation training and biofeedback. CLUSTER HEADACHES Q: What is a cluster headache? What causes it? How can I treat it? A: Cluster headache gets its name because the attacks come in groups. The pain arrives with little, if any, warning. It is usually on one side of the head. A tearing or bloodshot eye and a runny nose on the same side of the headache may also accompany the pain. Cluster headaches are believed to be caused by chemical reactions in the brain. They have been described as the most severe and intense of any headache type. Treatment for cluster headache includes prescription medication and oxygen. SINUS HEADACHES Q: What is a sinus headache? What causes it? How can I treat it? A: When a cavity in the bones of the face and skull (a sinus) becomes inflamed, the inflammation will cause localized pain. This condition is usually the result of an allergic reaction, a tumor, or an infection. If your headache is caused by a sinus blockage, such as an infection, you will probably have a fever. An x-ray will confirm a sinus blockage. Your caregiver's treatment might include antibiotics for the infection, as well as antihistamines or decongestants.  REBOUND HEADACHES Q: What is a  rebound headache? What causes it? How can I treat it? A: A pattern of taking acute headache medications too often can lead to a condition known as "rebound headache." A  pattern of taking too much headache medication includes taking it more than 2 days per week or in excessive amounts. That means more than the label or a caregiver advises. With rebound headaches, your medications not only stop relieving pain, they actually begin to cause headaches. Doctors treat rebound headache by tapering the medication that is being overused. Sometimes your caregiver will gradually substitute a different type of treatment or medication. Stopping may be a challenge. Regularly overusing a medication increases the potential for serious side effects. Consult a caregiver if you regularly use headache medications more than 2 days per week or more than the label advises. ADDITIONAL QUESTIONS AND ANSWERS Q: What is biofeedback? A: Biofeedback is a self-help treatment. Biofeedback uses special equipment to monitor your body's involuntary physical responses. Biofeedback monitors:  Breathing.  Pulse.  Heart rate.  Temperature.  Muscle tension.  Brain activity. Biofeedback helps you refine and perfect your relaxation exercises. You learn to control the physical responses that are related to stress. Once the technique has been mastered, you do not need the equipment any more. Q: Are headaches hereditary? A: Four out of five (80%) of people that suffer report a family history of migraine. Scientists are not sure if this is genetic or a family predisposition. Despite the uncertainty, a child has a 50% chance of having migraine if one parent suffers. The child has a 75% chance if both parents suffer.  Q: Can children get headaches? A: By the time they reach high school, most young people have experienced some type of headache. Many safe and effective approaches or medications can prevent a headache from occurring or stop it after it has begun.  Q: What type of doctor should I see to diagnose and treat my headache? A: Start with your primary caregiver. Discuss his or her experience and  approach to headaches. Discuss methods of classification, diagnosis, and treatment. Your caregiver may decide to recommend you to a headache specialist, depending upon your symptoms or other physical conditions. Having diabetes, allergies, etc., may require a more comprehensive and inclusive approach to your headache. The National Head[employer] Foundation will provide, upon request, a list of [facility] physician members in your state. Document Released: 09/22/2003 Document Revised: 09/24/2011 Document Reviewed: 03/01/2008 [facility] Patient Information 2014 [place], [place].  Temporal Arteritis Arteries are blood vessels that carry blood from the heart to all parts of the body. Temporal arteritis is a swelling (inflammation) of certain large arteries. This usually affects arteries in the head and neck area, including arteries in the area on the side of the head, between the ears and eyes (temples). The condition can be very painful. It also can cause serious problems, even blindness. Early diagnosis and treatment is very important. CAUSES  Temporal arteritis results from the body reacting to injury or infection (inflammation). This may occur when the body's immune system (which fights germs and disease) makes a mistake. It attacks its own arteries. No one knows why this happens. However, certain things (risk factors) make it more likely that a person will develop temporal arteritis. They include:  Age. Most people with temporal arteritis are older than 50. The average age is 75.  Sex. Three times more women than men develop the condition.  Race and ethnic background. Caucasians are more likely to have temporal arteritis than other races.  So are people whose families came from [place]  Having polymyalgia rheumatica (PMR). This condition causes stiffness and pain in the joints of the neck, shoulders and hips. About 15% of people with PMR also have temporal  arteritis. SYMPTOMS  Not everyone with temporal arteritis has the same symptoms. Some people have just one symptom. Others may have several. The most common symptom is a new headache, often in the temple region. Symptoms may show up in other parts of the body too.   Symptoms affecting the head may include:  Temporal arteries that feel hard or swollen. It may hurt when the temples are touched.  Pain when combing your hair, or when laying your head on a pillow.  Pain in the jaw when chewing.  Pain in the throat or tongue.  Visual problems, including sudden loss of vision in one eye, or seeing double.  Symptoms in other parts of the body may include:  Fever.  Fatigue.  A dry cough.  Pain in the hips and shoulders.  Pain in the arms during exercise.  Depression.  Weight loss. DIAGNOSIS  Symptoms of temporal arteritis are similar to symptoms for other conditions. This can make it hard to tell if you have the condition. To be sure, your caregiver will ask about your symptoms and do a physical exam. Certain tests may be necessary, such as:   An exam of your temples. Often, the temporal arteries will be swollen and hard. This can be felt.  A complete blood count. This test shows how many red blood cells are in your blood. Most people with temporal arteritis do not have enough red blood cells (anemic).  Erythrocyte sedimentation (also called sed rate test). It measures inflammation in the body. Almost everyone with temporal arteritis has a high sed rate.  C reactive protein test. This also shows if there is inflammation.  Biopsy a temporal artery. This means the caregiver will take out a small piece of an artery. Then, it is checked under a microscope for inflammation. More than one biopsy may be needed. That is because inflammation can be in one part of an artery and not in others. Your caregiver may need to check more than one spot. TREATMENT  Starting treatment right away is  very important. Often, you will need to see a specialist in immunologic diseases (rheumatologist). Goals of treatment include protecting your eyesight. Once vision is gone, it might not come back. The normal treatment is medication. It usually works well and quickly. Most people start getting better in a few days. Medication options include:  Corticosteroids. These are powerful drugs that fight inflammation. These drugs are most often used to treat temporal arteritis.  Usually, a high dose is taken at first. After symptoms improve, a smaller dose is used. The goal is to take the smallest dose possible and still control your symptoms. That is because using corticosteroids for a long time can cause problems. They can make muscles and bones weak. They can cause blood pressure to go up, and cause diabetes. Also, people often gain weight when they take corticosteroids. Corticosteroids may need to be taken for one or two years.  Several newer drugs are being tested to treat temporal arteritis. Researchers are testing to see if new drugs will work as well as corticosteroids, but cause fewer problems than them. Testing of these drugs is not yet complete.  Some specialists recommend low dose aspirin to prevent blood clots. HOME CARE INSTRUCTIONS  Take any corticosteroids that your caregiver prescribes. Follow the directions carefully.  Take any vitamins or supplements that your caregiver suggests. This may include vitamin D and calcium. They help keep your bones from becoming weak.  Keep all appointments for checkups. Your caregiver will watch for any problems from the medication. Checkups may include:  Periodic blood tests.  Bone density testing. This checks how strong or weak your bones are.  Blood pressure checks. If your blood pressure rises, you may need to take a drug to control it while you are taking corticosteroids.  Blood sugar checks. This is to be sure you are not developing diabetes. If  you have diabetes, corticosteroid medications may make it worse and require increased treatment.  Exercise. First, talk with your caregiver about what would be OK for you to do. Aerobic exercise (which increases your heart rate) is usually suggested. It does not have to require a lot of energy. Walking is aerobic exercise. This type of exercise is good because it helps prevent bone loss. It also helps control your blood pressure.  Follow a healthy diet. The goal is to prevent bone damage and diabetes. Include good sources of protein in your diet. Also, include fruits, vegetables and whole grains. Your caregiver can refer you to an expert on healthy eating (dietitian) for more advice. SEEK MEDICAL CARE IF:   The symptoms that lead to your diagnosis return.  You develop worsening fever, fatigue, headache, weight loss, or pain in your jaw.  You develop signs of infection. Infections can be worse if you are on corticosteroid medication. SEEK IMMEDIATE MEDICAL CARE IF:   Your eyesight changes.  Pain does not go away, even after taking pain medicine.  You feel pain in your chest.  Breathing is difficult.  One side of your face or body suddenly becomes weak or numb.  You develop a fever of more than 102 F (38.9 C). Document Released: 04/29/2009 Document Revised: 09/24/2011 Document Reviewed: 04/29/2009 [facility] Patient Information 2014 [place], [place].

## 2013-12-19 NOTE — ED Provider Notes (Signed)
CSN: [id number]     Arrival date & time 12/19/13  1315 History  This chart was scribed for Rolland Porter, MD by Evon Slack, ED Scribe. This patient was seen in room MH11/MH11 and the patient's care was started at 4:10 PM.     Chief Complaint  Patient presents with  . Headache   Patient is a 68 y.o. female presenting with headaches. The history is provided by the patient. No language interpreter was used.  Headache Associated symptoms: nausea and photophobia   Associated symptoms: no abdominal pain, no cough, no diarrhea, no dizziness, no fatigue, no fever, no sore throat and no vomiting    HPI Comments: [name] is a 68 y.o. female who presents to the Emergency Department complaining of intermittent headaches onset April. She states the headaches are present everyday. She states he has sharp pains on her right temporal lobe. She states she has associated light sensitivity, and nausea.  States she visited her PCP with no improvement to her symptoms. Denies fever, gait problem, injury, fall, or hearing change. She states she was prescribed prednisone over several days with no improvement. She states she has a h/o of migraine headaches for over 20 years. She states that previously her headaches would go away with medication.   Past Medical History  Diagnosis Date  . GERD (gastroesophageal reflux disease)   . Anxiety   . Chronic pain   . Constipation   . Migraine    Past Surgical History  Procedure Laterality Date  . Appendectomy    . Bladder surgery      Mesh implanted  . Shoulder surgery      X 2  . Tonsillectomy    . Abdominal wall mesh  removal     Family History  Problem Relation Age of Onset  . Coronary artery disease Father 4  . Coronary artery disease Mother 9   History  Substance Use Topics  . Smoking status: Never Smoker   . Smokeless tobacco: Not on file  . Alcohol Use: No   OB History   Grav Para Term Preterm Abortions TAB SAB Ect Mult Living                  Review of Systems  Constitutional: Negative for fever, chills, diaphoresis, appetite change and fatigue.  HENT: Negative for mouth sores, sore throat and trouble swallowing.   Eyes: Positive for photophobia. Negative for visual disturbance.  Respiratory: Negative for cough, chest tightness, shortness of breath and wheezing.   Cardiovascular: Negative for chest pain.  Gastrointestinal: Positive for nausea. Negative for vomiting, abdominal pain, diarrhea and abdominal distention.  Endocrine: Negative for polydipsia, polyphagia and polyuria.  Genitourinary: Negative for dysuria, frequency and hematuria.  Musculoskeletal: Negative for gait problem.  Skin: Negative for color change, pallor and rash.  Neurological: Positive for headaches. Negative for dizziness, syncope and light-headedness.  Hematological: Does not bruise/bleed easily.  Psychiatric/Behavioral: Negative for behavioral problems and confusion.      Allergies  Ativan and Sulfa antibiotics  Home Medications   Prior to Admission medications   Medication Sig Start Date End Date Taking? Authorizing Provider  ALPRAZolam Prudy Feeler) 1 MG tablet Take 1 mg by mouth 4 (four) times daily.    Historical Provider, MD  clonazePAM (KLONOPIN) 0.5 MG tablet Take 0.5 tablets (0.25 mg total) by mouth 3 (three) times daily as needed (anxiety). 05/26/13   Renae Fickle, MD  imipramine (TOFRANIL) 50 MG tablet Take 100 mg by mouth at bedtime.  Historical Provider, MD  methadone (DOLOPHINE) 5 MG tablet Take 5 mg by mouth 4 (four) times daily.    Historical Provider, MD  methylPREDNISolone (MEDROL DOSEPAK) 4 MG tablet 6 po on day 1, decrease by 1 tab per day 12/19/13   Rolland PorterMark Jaleil Renwick, MD  omeprazole (PRILOSEC) 20 MG capsule Take 20 mg by mouth daily.    Historical Provider, MD  oxyCODONE (ROXICODONE) 5 MG immediate release tablet Take 1 tablet (5 mg total) by mouth every 4 (four) hours as needed for severe pain. 12/19/13   Rolland PorterMark Miguel Christiana, MD  traZODone  (DESYREL) 100 MG tablet Take 100 mg by mouth 3 (three) times daily.    Historical Provider, MD  zolpidem (AMBIEN) 10 MG tablet Take 10 mg by mouth at bedtime as needed for sleep.    Historical Provider, MD   Triage Vitals: BP 111/77  Pulse 96  Temp(Src) 97.9 F (36.6 C) (Oral)  Resp 20  Ht 5\' 3"  (1.6 m)  Wt 117 lb (53.071 kg)  BMI 20.73 kg/m2  SpO2 96%  Physical Exam  Nursing note and vitals reviewed. Constitutional: She is oriented to person, place, and time. She appears well-developed and well-nourished. No distress.  HENT:  Head: Normocephalic and atraumatic.  Diffuse tenderness to right temporal artery.  Eyes: Conjunctivae are normal. Pupils are equal, round, and reactive to light. No scleral icterus.  Neck: Normal range of motion. Neck supple. No thyromegaly present.  Cardiovascular: Normal rate and regular rhythm.  Exam reveals no gallop and no friction rub.   No murmur heard. Pulmonary/Chest: Effort normal and breath sounds normal. No respiratory distress. She has no wheezes. She has no rales.  Abdominal: Soft. Bowel sounds are normal. She exhibits no distension. There is no tenderness. There is no rebound.  Musculoskeletal: Normal range of motion.  Neurological: She is alert and oriented to person, place, and time.  Skin: Skin is warm and dry. No rash noted.  Psychiatric: She has a normal mood and affect. Her behavior is normal.    ED Course  Procedures (including critical care time) DIAGNOSTIC STUDIES: Oxygen Saturation is 96% on RA, adequate by my interpretation.    COORDINATION OF CARE: 4:14 PM-Discussed treatment plan which includes CT scan and pain medication with pt at bedside and pt agreed to plan.     Labs Review Labs Reviewed  SEDIMENTATION RATE    Imaging Review Ct Head Wo Contrast  12/19/2013   CLINICAL DATA:  progressive headache with dizziness  EXAM: CT HEAD WITHOUT CONTRAST  TECHNIQUE: Contiguous axial images were obtained from the base of the  skull through the vertex without intravenous contrast.  COMPARISON:  Brain CT and brain MRI May 24, 2013  FINDINGS: The ventricles are normal in size and configuration. There is no mass, hemorrhage, extra-axial fluid collection, or midline shift. The gray-white compartments are normal. There is no demonstrable acute infarct. Bony calvarium appears intact. The mastoid air cells are clear.  IMPRESSION: Study within normal limits.   Electronically Signed   By: Bretta BangWilliam  Woodruff M.D.   On: 12/19/2013 17:13     EKG Interpretation None      MDM   Final diagnoses:  Headache  Temporal arteritis      Sedimentation rate negative. CT scan normal. We'll continue her on a dose of steroids. She has an exam suggestive of arteritis. I discussed this with her. Recommended neurology followup. Discussed possibility of consideration for biopsy if her symptoms persist     Rolland PorterMark Manasvini Whatley, MD 12/19/13  1853 

## 2013-12-19 NOTE — ED Notes (Signed)
Intermittent, generalized headache since April.  Worsened last Tuesday, c/o pain, dizziness.  Has scheduled an appointment with her physician for management of her headaches.

## 2013-12-22 ENCOUNTER — Ambulatory Visit (INDEPENDENT_AMBULATORY_CARE_PROVIDER_SITE_OTHER): Payer: Medicare Other | Admitting: Neurology

## 2013-12-22 ENCOUNTER — Encounter: Payer: Self-pay | Admitting: Neurology

## 2013-12-22 VITALS — BP 108/72 | HR 77 | Temp 98.2°F | Ht 63.0 in | Wt 119.0 lb

## 2013-12-22 DIAGNOSIS — G43809 Other migraine, not intractable, without status migrainosus: Secondary | ICD-10-CM | POA: Diagnosis not present

## 2013-12-22 DIAGNOSIS — G43901 Migraine, unspecified, not intractable, with status migrainosus: Secondary | ICD-10-CM

## 2013-12-22 MED ORDER — DIVALPROEX SODIUM ER 250 MG PO TB24: ORAL_TABLET | ORAL | Status: DC

## 2013-12-22 MED ORDER — KETOROLAC TROMETHAMINE 30 MG/ML IJ SOLN
30.0000 mg | Freq: Once | INTRAMUSCULAR | Status: AC
  Administered 2013-12-22: 30 mg via INTRAMUSCULAR

## 2013-12-22 NOTE — Progress Notes (Signed)
Patient here seeing Dr. Frances Furbish.  Order for Toradol 30mg  IM.  Under aseptic technique Toradol 30mg  given IM in right gluteal.  Tolerated well.  Bandaid applied.

## 2013-12-22 NOTE — Patient Instructions (Signed)
Patient to rest when she returns home.

## 2013-12-22 NOTE — Patient Instructions (Addendum)
Please remember, common headache triggers are: sleep deprivation, dehydration, overheating, stress, hypoglycemia or skipping meals and blood sugar fluctuations, excessive pain medications or excessive alcohol use or caffeine withdrawal. Some people have food triggers such as aged cheese, orange juice or chocolate, especially dark chocolate, or MSG (monosodium glutamate). Try to avoid these headache triggers as much possible. It may be helpful to keep a headache diary to figure out what makes your headaches worse or brings them on and what alleviates them. Some people report headache onset after exercise but studies have shown that regular exercise may actually prevent headaches from coming. If you have exercise-induced headaches, please make sure that you drink plenty of fluid before and after exercising and that you do not over do it and do not overheat.  We will try toradol injection today.   Please follow up with your neurologist as planned in August.   I can will start you on Depakote and we will do blood work today and will call you with the results. Depakote may cause sedation, which is why we will start at night only.

## 2013-12-22 NOTE — Progress Notes (Signed)
Subjective:    Patient ID: [name] is a 68 y.o. female.  HPI    Star Age, MD, PhD [facility] [address] [place], [place] [address]  Dear Dr. Jeneen Rinks,   I saw [name], upon your kind request in my neurologic clinic today for initial consultation of her migraine headaches. The patient is unaccompanied today. As you know, [name] is a 68 year old right-handed woman with an underlying medical history of anxiety, constipation, reflux disease, who has a long-standing history since her 9s of migraine headaches, which started after her oophorectomy. Over the course of years she has seen many differenct doctors. Her current neurologist is Dr. Baltazar Najjar and she got Phenergan and Demerol last week, with no results. She has an appointment in August with her but has not seen her in about 2 years, as had no migraines. These typically occur in the right temporal area. She has associated nausea and vomiting typically. She also has associated photophobia and symptoms are relieved typically by lying in a dark quiet area and trying to rest. She presented to the emergency room on 12/19/2013 with one of her typical migraine attacks. She had a head CT without contrast on 12/19/2013, which was negative for any acute changes. In addition, personally reviewed the images through the PACS system. Previously, she had a brain MRI without contrast on 05/24/2013: 1. Normal MRI appearance of the brain for age. 2. Minimal sinus disease. 3. Minimal deep and fluid in the right mastoid air cells is chronic. No obstructing nasopharyngeal lesion is evident. In addition, personally reviewed the images through the PACS system. On 12/19/2013 she had an ESR which was 10. She was symptomatically treated with Decadron, Phenergan, morphine, and was discharged with a Medrol Dosepak and oxycodone as needed. In the ER, she felt improved, but her HA returned.  She has tried in the past,  reglan IV, amitriptyline, depakote, topamax, without SEs. She [facility], but does not want to go back.  She had success with the medications in the past. She had no migraines for years until April. She has had a daily HA since April and Prednisome dose pack has helped.  She does not smoke or drink EtOH, and drinks caffeine 2 cups of coffee, occasional coke.  She does endorse stress and has had pelvic pain since she had a mesh inserted. She has tried Imitrex, but it did not help. Never had TIA or stroke symptoms, denying sudden onset of one sided weakness, numbness, tingling, slurring of speech or droopy face, hearing loss, tinnitus, diplopia or visual field cut or monocular loss of vision, and denies recurrent headaches.  She is still finishing her medrol dose back, but so far, it has not helped.   She is currently on methadone for chronic pelvic pain from a mesh procedure. She sees a pain specialist at [facility] for this. She is tearful today as nothing has completely alleviated her headache. She denies any blurry vision or double vision. She has been given Toradol in the past but does not recall if it helped. She denies any side effects with it. She endorses sleeping well. She takes trazodone and Ambien for sleep. In the past, sleep deprivation was a trigger as well. She endorses drinking enough water. Her mouth stays dry because of medication side effect  Her Past Medical History Is Significant For: Past Medical History  Diagnosis Date  . GERD (gastroesophageal  reflux disease)   . Anxiety   . Chronic pain   . Constipation   . Migraine     Her Past Surgical History Is Significant For: Past Surgical History  Procedure Laterality Date  . Appendectomy    . Bladder surgery      Mesh implanted  . Shoulder surgery      X 2  . Tonsillectomy    . Abdominal wall mesh  removal      Her Family History Is Significant For: Family History  Problem  Relation Age of Onset  . Coronary artery disease Father 35  . Coronary artery disease Mother 28    Her Social History Is Significant For: History   Social History  . Marital Status: Married    Spouse Name: N/A    Number of Children: 1  . Years of Education: N/A   Occupational History  . Retired    Social History Main Topics  . Smoking status: Never Smoker   . Smokeless tobacco: None  . Alcohol Use: No  . Drug Use: No  . Sexual Activity: Yes    Birth Control/ Protection: Post-menopausal   Other Topics Concern  . None   Social History Narrative  . None    Her Allergies Are:  Allergies  Allergen Reactions  . Ativan [Lorazepam]   . Sulfa Antibiotics Hives    unknown  :   Her Current Medications Are:  Outpatient Encounter Prescriptions as of 12/22/2013  Medication Sig  . ALPRAZolam (XANAX) 1 MG tablet Take 1 mg by mouth at bedtime as needed for anxiety.  . docusate sodium (COLACE) 100 MG capsule Take 100 mg by mouth 2 (two) times daily.  Marland Kitchen glycerin adult (GLYCERIN ADULT) 2 G SUPP Place 1 suppository rectally once.  Marland Kitchen imipramine (TOFRANIL) 50 MG tablet Take 1 tablet by mouth 2 (two) times daily.  Marland Kitchen lactulose (CHRONULAC) 10 GM/15ML solution Take 2 mLs by mouth 2 (two) times daily.  . methadone (DOLOPHINE) 5 MG tablet Take 1 tablet by mouth 4 (four) times daily.  Marland Kitchen omeprazole (PRILOSEC) 20 MG capsule Take 1 capsule by mouth daily.  Marland Kitchen oxyCODONE (OXY IR/ROXICODONE) 5 MG immediate release tablet Take 5 mg by mouth every 4 (four) hours as needed for severe pain.  . polyethylene glycol (MIRALAX / GLYCOLAX) packet Take 17 g by mouth daily.  Marland Kitchen senna (SENOKOT) 8.6 MG tablet Take 2 tablets by mouth daily.  . traZODone (DESYREL) 100 MG tablet Take 1 tablet by mouth at bedtime.  Marland Kitchen zolpidem (AMBIEN) 10 MG tablet Take 10 mg by mouth at bedtime as needed for sleep.  . [DISCONTINUED] ALPRAZolam (XANAX) 1 MG tablet Take 1 tablet by mouth 4 (four) times daily as needed.  . [DISCONTINUED]  zolpidem (AMBIEN) 10 MG tablet Take 1 tablet by mouth at bedtime as needed.  . [DISCONTINUED] ALPRAZolam (XANAX) 1 MG tablet Take 1 mg by mouth 4 (four) times daily.  . [DISCONTINUED] clonazePAM (KLONOPIN) 0.5 MG tablet Take 0.5 tablets (0.25 mg total) by mouth 3 (three) times daily as needed (anxiety).  . [DISCONTINUED] imipramine (TOFRANIL) 50 MG tablet Take 100 mg by mouth at bedtime.  . [DISCONTINUED] methadone (DOLOPHINE) 5 MG tablet Take 5 mg by mouth 4 (four) times daily.  . [DISCONTINUED] methylPREDNISolone (MEDROL DOSEPAK) 4 MG tablet 6 po on day 1, decrease by 1 tab per day  . [DISCONTINUED] omeprazole (PRILOSEC) 20 MG capsule Take 20 mg by mouth daily.  . [DISCONTINUED] oxyCODONE (ROXICODONE) 5 MG immediate release  tablet Take 1 tablet (5 mg total) by mouth every 4 (four) hours as needed for severe pain.  . [DISCONTINUED] traZODone (DESYREL) 100 MG tablet Take 100 mg by mouth 3 (three) times daily.  . [DISCONTINUED] zolpidem (AMBIEN) 10 MG tablet Take 10 mg by mouth at bedtime as needed for sleep.  :   Review of Systems:  Out of a complete 14 point review of systems, all are reviewed and negative with the exception of these symptoms as listed below:  Review of Systems  Constitutional: Negative.   Eyes: Negative.   Respiratory: Negative.   Cardiovascular: Negative.   Gastrointestinal: Positive for constipation.  Endocrine: Negative.   Genitourinary: Negative.   Musculoskeletal: Negative.   Skin: Negative.   Allergic/Immunologic: Negative.   Neurological: Positive for headaches.  Hematological: Negative.   Psychiatric/Behavioral: Negative.     Objective:  Neurologic Exam  Physical Exam Physical Examination:   Filed Vitals:   12/22/13 0958  BP: 108/72  Pulse: 77  Temp: 98.2 F (36.8 C)    General Examination: The patient is a very pleasant 68 y.o. female in mild distress. She is tearful.   HEENT: Normocephalic, atraumatic, pupils are equal, round and reactive to  light and accommodation. Funduscopic exam is normal with sharp disc margins noted. Extraocular tracking is good without limitation to gaze excursion or nystagmus noted. Normal smooth pursuit is noted. Hearing is grossly intact. Tympanic membranes are clear bilaterally. Face is symmetric with normal facial animation and normal facial sensation. Speech is clear with no dysarthria noted. There is no hypophonia. There is no lip, neck/head, jaw or voice tremor. Neck is supple with full range of passive and active motion. There are no carotid bruits on auscultation. Oropharynx exam reveals: mild mouth dryness, adequate dental hygiene and no significant airway crowding. Mallampati is class II. Tongue protrudes centrally and palate elevates symmetrically. She is somewhat tender to touch in the right temporal area but no palpable cord is noted. There is no rash.   Chest: Clear to auscultation without wheezing, rhonchi or crackles noted.  Heart: S1+S2+0, regular and normal without murmurs, rubs or gallops noted.   Abdomen: Soft, non-tender and non-distended with normal bowel sounds appreciated on auscultation.  Extremities: There is no pitting edema in the distal lower extremities bilaterally. Pedal pulses are intact.  Skin: Warm and dry without trophic changes noted. There are no varicose veins.  Musculoskeletal: exam reveals no obvious joint deformities, tenderness or joint swelling or erythema.   Neurologically:  Mental status: The patient is awake, alert and oriented in all 4 spheres. Her immediate and remote memory, attention, language skills and fund of knowledge are appropriate. There is no evidence of aphasia, agnosia, apraxia or anomia. Speech is clear with normal prosody and enunciation. Thought process is linear. Mood is anxious and affect is blunted.  Cranial nerves II - XII are as described above under HEENT exam. In addition: shoulder shrug is normal with equal shoulder height noted. Motor exam:  Normal bulk, strength and tone is noted. There is no drift, tremor or rebound. Romberg is negative. Reflexes are 2+ throughout. Babinski: Toes are flexor bilaterally. Fine motor skills and coordination: intact with normal finger taps, normal hand movements, normal rapid alternating patting, normal foot taps and normal foot agility.  Cerebellar testing: No dysmetria or intention tremor on finger to nose testing. Heel to shin is unremarkable bilaterally. There is no truncal or gait ataxia.  Sensory exam: intact to light touch, pinprick, vibration, temperature sense in the  upper and lower extremities.  Gait, station and balance: She stands easily. No veering to one side is noted. No leaning to one side is noted. Posture is age-appropriate and stance is narrow based. Gait shows normal stride length and normal pace. No problems turning are noted. She turns en bloc. Tandem walk is somewhat difficult for her.                 Assessment and Plan:   In summary, [name] is a very pleasant 68 y.o.-year old female with a long-standing history of migraine headaches who was doing well up until April of this year. She has had a daily headache since then. She recently went to the emergency room on 12/19/2013 and had some symptomatic relief with the migraine cocktail. She had head CT which was negative for any acute process and a normal ESR. We will repeat an ESR, CRP, ANA today. She has an appointment with her neurologist in 2 months from now. In the interim, I suggested we restart her on a prophylactic medication. Imitrex in the past has not worked for abortive treatment. I would recommend Depakote 250 mg strength long acting once at night with increase to 500 mg after 2 weeks. I advised her about potential side effects including sedation. For symptomatic relief today we will to IM Toradol injection in the office before she leaves. We will call her with her blood test results as well. She has a nonfocal exam today  with the exception of mild tenderness in the general right temporal area but no other sinister findings and her history or exam to suggest giant cell arteritis. Furthermore, she is encouraged to discuss botulinum toxin injections with her current neurologist when she sees her in August. I will see her back on an as-needed basis. I did advise her regarding potential headache triggers including sleep deprivation, dehydration, overheating, stress, hypoglycemia or skipping meals and blood sugar fluctuations, excessive pain medications or excessive alcohol use or caffeine withdrawal. Some people have food triggers such as aged cheese, orange juice or chocolate, especially dark chocolate, or MSG (monosodium glutamate). She is to try to avoid these headache triggers as much possible. It may be helpful to keep a headache diary to figure out what makes Her headaches worse or brings them on and what alleviates them. Some people report headache onset after exercise but studies have shown that regular exercise may actually prevent headaches from coming. If She has exercise-induced headaches, She is advised to drink plenty of fluid before and after exercising and that to not overdo it and to not overheat.  Thank you very much for allowing me to participate in the care of this nice patient. If I can be of any further assistance to you please do not hesitate to call me at [phone].  Sincerely,   Star Age, MD, PhD

## 2013-12-23 ENCOUNTER — Emergency Department (HOSPITAL_BASED_OUTPATIENT_CLINIC_OR_DEPARTMENT_OTHER)
Admission: EM | Admit: 2013-12-23 | Discharge: 2013-12-23 | Disposition: A | Discharge status: Home / Self Care | Payer: Medicare Other | Attending: Emergency Medicine | Admitting: Emergency Medicine

## 2013-12-23 ENCOUNTER — Encounter (HOSPITAL_BASED_OUTPATIENT_CLINIC_OR_DEPARTMENT_OTHER): Payer: Self-pay | Admitting: Emergency Medicine

## 2013-12-23 DIAGNOSIS — K219 Gastro-esophageal reflux disease without esophagitis: Secondary | ICD-10-CM | POA: Insufficient documentation

## 2013-12-23 DIAGNOSIS — F411 Generalized anxiety disorder: Secondary | ICD-10-CM | POA: Insufficient documentation

## 2013-12-23 DIAGNOSIS — K5900 Constipation, unspecified: Secondary | ICD-10-CM | POA: Insufficient documentation

## 2013-12-23 DIAGNOSIS — G43909 Migraine, unspecified, not intractable, without status migrainosus: Secondary | ICD-10-CM | POA: Diagnosis not present

## 2013-12-23 DIAGNOSIS — G8929 Other chronic pain: Secondary | ICD-10-CM | POA: Insufficient documentation

## 2013-12-23 DIAGNOSIS — Z79899 Other long term (current) drug therapy: Secondary | ICD-10-CM | POA: Insufficient documentation

## 2013-12-23 MED ORDER — METOCLOPRAMIDE HCL 5 MG/ML IJ SOLN
10.0000 mg | Freq: Once | INTRAMUSCULAR | Status: AC
  Administered 2013-12-23: 10 mg via INTRAVENOUS
  Filled 2013-12-23: qty 2

## 2013-12-23 MED ORDER — MORPHINE SULFATE 4 MG/ML IJ SOLN
4.0000 mg | Freq: Once | INTRAMUSCULAR | Status: AC
  Administered 2013-12-23: 4 mg via INTRAVENOUS
  Filled 2013-12-23: qty 1

## 2013-12-23 MED ORDER — DEXAMETHASONE SODIUM PHOSPHATE 10 MG/ML IJ SOLN
10.0000 mg | Freq: Once | INTRAMUSCULAR | Status: AC
Start: 2013-12-23 — End: 2013-12-23
  Administered 2013-12-23: 10 mg via INTRAVENOUS
  Filled 2013-12-23: qty 1

## 2013-12-23 MED ORDER — DIPHENHYDRAMINE HCL 50 MG/ML IJ SOLN
25.0000 mg | Freq: Once | INTRAMUSCULAR | Status: AC
  Administered 2013-12-23: 25 mg via INTRAVENOUS
  Filled 2013-12-23: qty 1

## 2013-12-23 NOTE — Discharge Instructions (Signed)
Migraine Headache A migraine headache is an intense, throbbing pain on one or both sides of your head. A migraine can last for 30 minutes to several hours. CAUSES  The exact cause of a migraine headache is not always known. However, a migraine may be caused when nerves in the brain become irritated and release chemicals that cause inflammation. This causes pain. Certain things may also trigger migraines, such as:  Alcohol.  Smoking.  Stress.  Menstruation.  Aged cheeses.  Foods or drinks that contain nitrates, glutamate, aspartame, or tyramine.  Lack of sleep.  Chocolate.  Caffeine.  Hunger.  Physical exertion.  Fatigue.  Medicines used to treat chest pain (nitroglycerine), birth control pills, estrogen, and some blood pressure medicines. SIGNS AND SYMPTOMS  Pain on one or both sides of your head.  Pulsating or throbbing pain.  Severe pain that prevents daily activities.  Pain that is aggravated by any physical activity.  Nausea, vomiting, or both.  Dizziness.  Pain with exposure to bright lights, loud noises, or activity.  General sensitivity to bright lights, loud noises, or smells. Before you get a migraine, you may get warning signs that a migraine is coming (aura). An aura may include:  Seeing flashing lights.  Seeing bright spots, halos, or zig-zag lines.  Having tunnel vision or blurred vision.  Having feelings of numbness or tingling.  Having trouble talking.  Having muscle weakness. DIAGNOSIS  A migraine headache is often diagnosed based on:  Symptoms.  Physical exam.  A CT scan or MRI of your head. These imaging tests cannot diagnose migraines, but they can help rule out other causes of headaches. TREATMENT Medicines may be given for pain and nausea. Medicines can also be given to help prevent recurrent migraines.  HOME CARE INSTRUCTIONS  Only take over-the-counter or prescription medicines for pain or discomfort as directed by your  health care provider. The use of long-term narcotics is not recommended.  Lie down in a dark, quiet room when you have a migraine.  Keep a journal to find out what may trigger your migraine headaches. For example, write down:  What you eat and drink.  How much sleep you get.  Any change to your diet or medicines.  Limit alcohol consumption.  Quit smoking if you smoke.  Get 7 9 hours of sleep, or as recommended by your health care provider.  Limit stress.  Keep lights dim if bright lights bother you and make your migraines worse. SEEK IMMEDIATE MEDICAL CARE IF:   Your migraine becomes severe.  You have a fever.  You have a stiff neck.  You have vision loss.  You have muscular weakness or loss of muscle control.  You start losing your balance or have trouble walking.  You feel faint or pass out.  You have severe symptoms that are different from your first symptoms. MAKE SURE YOU:   Understand these instructions.  Will watch your condition.  Will get help right away if you are not doing well or get worse. Document Released: 07/02/2005 Document Revised: 04/22/2013 Document Reviewed: 03/09/2013 [employer] Patient Information 2014 [employer].  

## 2013-12-23 NOTE — ED Notes (Addendum)
Pt c/o " migraine" x 3 months seen by Neuro yesterday  Started on Depakote pt has taken 1 dose and reports no relief. Pt requesting a " shot for pain " and pain meds

## 2013-12-23 NOTE — ED Provider Notes (Signed)
CSN: [id number]     Arrival date & time 12/23/13  1614 History   First MD Initiated Contact with Patient 12/23/13 1618     Chief Complaint  Patient presents with  . Migraine     (Consider location/radiation/quality/duration/timing/severity/associated sxs/prior Treatment) HPI Comments: History of headaches. In the office yesterday by neurology. They reviewed her recent results with included a negative head CT from 4 days ago, normal brain MRI from November of last year. She also had ESR of 10 from 4 days ago. That was repeated and was 2. CRP was 0.2. Patient suffered from chronic migraines for almost 30 years. She's had a headache since April of this past year. She was given IM Toradol and the neurologist office. The neurologist will start her on Depakote for her headaches for maintenance therapy. She's had no luck with triptan abortive therapy in the past.  Patient is a 68 y.o. female presenting with migraines. The history is provided by the patient.  Migraine This is a chronic problem. The current episode started more than 1 week ago. The problem occurs constantly. The problem has been gradually worsening. Associated symptoms include headaches. Pertinent negatives include no chest pain, no abdominal pain and no shortness of breath. Nothing aggravates the symptoms. Relieved by: Medrol Dose pack. Treatments tried: Medrol dose pack helping, no relief with IM toradol.  The treatment provided mild relief.    Past Medical History  Diagnosis Date  . GERD (gastroesophageal reflux disease)   . Anxiety   . Chronic pain   . Constipation   . Migraine    Past Surgical History  Procedure Laterality Date  . Appendectomy    . Bladder surgery      Mesh implanted  . Shoulder surgery      X 2  . Tonsillectomy    . Abdominal wall mesh  removal     Family History  Problem Relation Age of Onset  . Coronary artery disease Father 27  . Coronary artery disease Mother 53   History  Substance Use  Topics  . Smoking status: Never Smoker   . Smokeless tobacco: Not on file  . Alcohol Use: No   OB History   Grav Para Term Preterm Abortions TAB SAB Ect Mult Living                 Review of Systems  Constitutional: Negative for fever and chills.  Respiratory: Negative for shortness of breath.   Cardiovascular: Negative for chest pain.  Gastrointestinal: Negative for vomiting and abdominal pain.  Neurological: Positive for headaches.  All other systems reviewed and are negative.     Allergies  Ativan and Sulfa antibiotics  Home Medications   Prior to Admission medications   Medication Sig Start Date End Date Taking? Authorizing Provider  ALPRAZolam Duanne Moron) 1 MG tablet Take 1 mg by mouth at bedtime as needed for anxiety.    Historical Provider, MD  divalproex (DEPAKOTE ER) 250 MG 24 hr tablet Take 1 pill each night for 2 weeks, then 2 pills PO thereafter. 12/22/13   Star Age, MD  docusate sodium (COLACE) 100 MG capsule Take 100 mg by mouth 2 (two) times daily.    Historical Provider, MD  glycerin adult (GLYCERIN ADULT) 2 G SUPP Place 1 suppository rectally once.    Historical Provider, MD  imipramine (TOFRANIL) 50 MG tablet Take 1 tablet by mouth 2 (two) times daily.    Historical Provider, MD  lactulose (CHRONULAC) 10 GM/15ML solution Take 2 mLs  by mouth 2 (two) times daily. 12/16/13   Historical Provider, MD  methadone (DOLOPHINE) 5 MG tablet Take 1 tablet by mouth 4 (four) times daily. 12/03/13 01/02/14  Historical Provider, MD  omeprazole (PRILOSEC) 20 MG capsule Take 1 capsule by mouth daily.    Historical Provider, MD  oxyCODONE (OXY IR/ROXICODONE) 5 MG immediate release tablet Take 5 mg by mouth every 4 (four) hours as needed for severe pain.    Historical Provider, MD  polyethylene glycol (MIRALAX / GLYCOLAX) packet Take 17 g by mouth daily.    Historical Provider, MD  senna (SENOKOT) 8.6 MG tablet Take 2 tablets by mouth daily.    Historical Provider, MD  traZODone  (DESYREL) 100 MG tablet Take 1 tablet by mouth at bedtime. 01/31/13   Historical Provider, MD  zolpidem (AMBIEN) 10 MG tablet Take 10 mg by mouth at bedtime as needed for sleep.    Historical Provider, MD   BP 118/87  Pulse 106  Temp(Src) 98.3 F (36.8 C)  Resp 16  Wt 119 lb (53.978 kg)  SpO2 97% Physical Exam  Nursing note and vitals reviewed. Constitutional: She is oriented to person, place, and time. She appears well-developed and well-nourished. No distress.  HENT:  Head: Normocephalic and atraumatic.  Mouth/Throat: Oropharynx is clear and moist.  Eyes: EOM are normal. Pupils are equal, round, and reactive to light.  Neck: Normal range of motion. Neck supple.  Cardiovascular: Normal rate and regular rhythm.  Exam reveals no friction rub.   No murmur heard. Pulmonary/Chest: Effort normal and breath sounds normal. No respiratory distress. She has no wheezes. She has no rales.  Abdominal: Soft. She exhibits no distension. There is no tenderness. There is no rebound.  Musculoskeletal: Normal range of motion. She exhibits no edema.  Neurological: She is alert and oriented to person, place, and time. No cranial nerve deficit or sensory deficit. Gait normal. GCS eye subscore is 4. GCS verbal subscore is 5. GCS motor subscore is 6.  Skin: She is not diaphoretic.    ED Course  Procedures (including critical care time) Labs Review Labs Reviewed - No data to display  Imaging Review No results found.   EKG Interpretation None      MDM   Final diagnoses:  Migraine    61F with chronic headaches. Saw Neuro yesterday, given Depakote with no relief. Thinks she's getting some relief with medrol dose packs. She asked for another one, I think this is reasonable. She mentioned relief with injections prior, instructed to f/u with a Neurologist for this. Stated her visit yesterday with the Neurologist was, "terrbile. I hated her." Her letter in the computer was very nice from yesterday's  visit, and it seemed like, from the note, they made some progress.  Patient offered migraine cocktail, one dose of narcotics. Is followed at [facility] for chronic pelvic pain. Feeling better after headache cocktail. Given Neuro referral numbers. Stable for discharge.    Osvaldo Shipper, MD 12/23/13 2329

## 2013-12-24 DIAGNOSIS — G43009 Migraine without aura, not intractable, without status migrainosus: Secondary | ICD-10-CM | POA: Diagnosis not present

## 2013-12-24 LAB — ANA W/REFLEX: ANA: NEGATIVE

## 2013-12-24 LAB — C-REACTIVE PROTEIN: CRP: 0.9 mg/L (ref 0.0–4.9)

## 2013-12-24 LAB — SEDIMENTATION RATE: Sed Rate: 2 mm/hr (ref 0–40)

## 2013-12-24 NOTE — Progress Notes (Signed)
Quick Note:  Please advise patient that her inflammatory markers including her sedimentation rate were normal. Huston Foley, MD, PhD [facility]  ______

## 2013-12-25 NOTE — Progress Notes (Signed)
Quick Note:  Shared lab results with patient , she verbalized understanding ______ 

## 2013-12-28 ENCOUNTER — Ambulatory Visit: Payer: Medicare Other | Admitting: Neurology

## 2013-12-28 DIAGNOSIS — R930 Abnormal findings on diagnostic imaging of skull and head, not elsewhere classified: Secondary | ICD-10-CM | POA: Diagnosis not present

## 2013-12-28 DIAGNOSIS — G43009 Migraine without aura, not intractable, without status migrainosus: Secondary | ICD-10-CM | POA: Diagnosis not present

## 2013-12-29 DIAGNOSIS — G43009 Migraine without aura, not intractable, without status migrainosus: Secondary | ICD-10-CM | POA: Diagnosis not present

## 2014-01-04 DIAGNOSIS — Z79899 Other long term (current) drug therapy: Secondary | ICD-10-CM | POA: Diagnosis not present

## 2014-01-04 DIAGNOSIS — G43009 Migraine without aura, not intractable, without status migrainosus: Secondary | ICD-10-CM | POA: Diagnosis not present

## 2014-01-11 DIAGNOSIS — Z79899 Other long term (current) drug therapy: Secondary | ICD-10-CM | POA: Diagnosis not present

## 2014-01-11 DIAGNOSIS — G43719 Chronic migraine without aura, intractable, without status migrainosus: Secondary | ICD-10-CM | POA: Diagnosis not present

## 2014-01-11 DIAGNOSIS — Z049 Encounter for examination and observation for unspecified reason: Secondary | ICD-10-CM | POA: Diagnosis not present

## 2014-01-12 DIAGNOSIS — R51 Headache: Secondary | ICD-10-CM | POA: Diagnosis not present

## 2014-01-12 DIAGNOSIS — IMO0001 Reserved for inherently not codable concepts without codable children: Secondary | ICD-10-CM | POA: Diagnosis not present

## 2014-01-12 DIAGNOSIS — G43719 Chronic migraine without aura, intractable, without status migrainosus: Secondary | ICD-10-CM | POA: Diagnosis not present

## 2014-01-12 DIAGNOSIS — M542 Cervicalgia: Secondary | ICD-10-CM | POA: Diagnosis not present

## 2014-01-12 DIAGNOSIS — G518 Other disorders of facial nerve: Secondary | ICD-10-CM | POA: Diagnosis not present

## 2014-01-26 DIAGNOSIS — G518 Other disorders of facial nerve: Secondary | ICD-10-CM | POA: Diagnosis not present

## 2014-01-26 DIAGNOSIS — R51 Headache: Secondary | ICD-10-CM | POA: Diagnosis not present

## 2014-01-26 DIAGNOSIS — M542 Cervicalgia: Secondary | ICD-10-CM | POA: Diagnosis not present

## 2014-01-26 DIAGNOSIS — IMO0001 Reserved for inherently not codable concepts without codable children: Secondary | ICD-10-CM | POA: Diagnosis not present

## 2014-01-26 DIAGNOSIS — G43719 Chronic migraine without aura, intractable, without status migrainosus: Secondary | ICD-10-CM | POA: Diagnosis not present

## 2014-01-29 DIAGNOSIS — T402X5A Adverse effect of other opioids, initial encounter: Secondary | ICD-10-CM | POA: Insufficient documentation

## 2014-01-29 DIAGNOSIS — K5903 Drug induced constipation: Secondary | ICD-10-CM | POA: Insufficient documentation

## 2014-02-02 DIAGNOSIS — K5901 Slow transit constipation: Secondary | ICD-10-CM | POA: Diagnosis not present

## 2014-02-02 DIAGNOSIS — R195 Other fecal abnormalities: Secondary | ICD-10-CM | POA: Diagnosis not present

## 2014-02-09 DIAGNOSIS — R51 Headache: Secondary | ICD-10-CM | POA: Diagnosis not present

## 2014-02-09 DIAGNOSIS — IMO0001 Reserved for inherently not codable concepts without codable children: Secondary | ICD-10-CM | POA: Diagnosis not present

## 2014-02-09 DIAGNOSIS — G43719 Chronic migraine without aura, intractable, without status migrainosus: Secondary | ICD-10-CM | POA: Diagnosis not present

## 2014-02-09 DIAGNOSIS — G518 Other disorders of facial nerve: Secondary | ICD-10-CM | POA: Diagnosis not present

## 2014-02-09 DIAGNOSIS — M542 Cervicalgia: Secondary | ICD-10-CM | POA: Diagnosis not present

## 2014-02-15 DIAGNOSIS — H2510 Age-related nuclear cataract, unspecified eye: Secondary | ICD-10-CM | POA: Diagnosis not present

## 2014-02-23 DIAGNOSIS — IMO0001 Reserved for inherently not codable concepts without codable children: Secondary | ICD-10-CM | POA: Diagnosis not present

## 2014-02-23 DIAGNOSIS — G518 Other disorders of facial nerve: Secondary | ICD-10-CM | POA: Diagnosis not present

## 2014-02-23 DIAGNOSIS — M542 Cervicalgia: Secondary | ICD-10-CM | POA: Diagnosis not present

## 2014-02-23 DIAGNOSIS — R51 Headache: Secondary | ICD-10-CM | POA: Diagnosis not present

## 2014-02-23 DIAGNOSIS — G43719 Chronic migraine without aura, intractable, without status migrainosus: Secondary | ICD-10-CM | POA: Diagnosis not present

## 2014-03-02 DIAGNOSIS — G43719 Chronic migraine without aura, intractable, without status migrainosus: Secondary | ICD-10-CM | POA: Diagnosis not present

## 2014-03-10 DIAGNOSIS — R51 Headache: Secondary | ICD-10-CM | POA: Diagnosis not present

## 2014-03-10 DIAGNOSIS — IMO0001 Reserved for inherently not codable concepts without codable children: Secondary | ICD-10-CM | POA: Diagnosis not present

## 2014-03-10 DIAGNOSIS — G518 Other disorders of facial nerve: Secondary | ICD-10-CM | POA: Diagnosis not present

## 2014-03-10 DIAGNOSIS — G43719 Chronic migraine without aura, intractable, without status migrainosus: Secondary | ICD-10-CM | POA: Diagnosis not present

## 2014-03-10 DIAGNOSIS — M542 Cervicalgia: Secondary | ICD-10-CM | POA: Diagnosis not present

## 2014-04-11 ENCOUNTER — Inpatient Hospital Stay (HOSPITAL_BASED_OUTPATIENT_CLINIC_OR_DEPARTMENT_OTHER)
Admission: EM | Admit: 2014-04-11 | Discharge: 2014-04-13 | DRG: 195 | Disposition: A | Discharge status: Home / Self Care | Payer: Medicare Other | Attending: Internal Medicine | Admitting: Internal Medicine

## 2014-04-11 ENCOUNTER — Emergency Department (HOSPITAL_BASED_OUTPATIENT_CLINIC_OR_DEPARTMENT_OTHER): Payer: Medicare Other

## 2014-04-11 ENCOUNTER — Encounter (HOSPITAL_BASED_OUTPATIENT_CLINIC_OR_DEPARTMENT_OTHER): Payer: Self-pay | Admitting: Emergency Medicine

## 2014-04-11 DIAGNOSIS — K5900 Constipation, unspecified: Secondary | ICD-10-CM | POA: Diagnosis present

## 2014-04-11 DIAGNOSIS — G8929 Other chronic pain: Secondary | ICD-10-CM | POA: Diagnosis present

## 2014-04-11 DIAGNOSIS — K219 Gastro-esophageal reflux disease without esophagitis: Secondary | ICD-10-CM | POA: Diagnosis present

## 2014-04-11 DIAGNOSIS — Z23 Encounter for immunization: Secondary | ICD-10-CM | POA: Diagnosis not present

## 2014-04-11 DIAGNOSIS — E876 Hypokalemia: Secondary | ICD-10-CM | POA: Diagnosis not present

## 2014-04-11 DIAGNOSIS — R143 Flatulence: Secondary | ICD-10-CM | POA: Diagnosis not present

## 2014-04-11 DIAGNOSIS — R141 Gas pain: Secondary | ICD-10-CM | POA: Diagnosis not present

## 2014-04-11 DIAGNOSIS — J189 Pneumonia, unspecified organism: Principal | ICD-10-CM | POA: Diagnosis present

## 2014-04-11 DIAGNOSIS — K6389 Other specified diseases of intestine: Secondary | ICD-10-CM | POA: Diagnosis not present

## 2014-04-11 DIAGNOSIS — R0602 Shortness of breath: Secondary | ICD-10-CM | POA: Diagnosis not present

## 2014-04-11 DIAGNOSIS — R9431 Abnormal electrocardiogram [ECG] [EKG]: Secondary | ICD-10-CM | POA: Diagnosis not present

## 2014-04-11 DIAGNOSIS — F419 Anxiety disorder, unspecified: Secondary | ICD-10-CM

## 2014-04-11 DIAGNOSIS — R195 Other fecal abnormalities: Secondary | ICD-10-CM | POA: Diagnosis not present

## 2014-04-11 DIAGNOSIS — Z79899 Other long term (current) drug therapy: Secondary | ICD-10-CM

## 2014-04-11 DIAGNOSIS — R918 Other nonspecific abnormal finding of lung field: Secondary | ICD-10-CM | POA: Diagnosis not present

## 2014-04-11 DIAGNOSIS — F411 Generalized anxiety disorder: Secondary | ICD-10-CM | POA: Diagnosis present

## 2014-04-11 DIAGNOSIS — R079 Chest pain, unspecified: Secondary | ICD-10-CM | POA: Diagnosis not present

## 2014-04-11 DIAGNOSIS — R109 Unspecified abdominal pain: Secondary | ICD-10-CM | POA: Diagnosis not present

## 2014-04-11 DIAGNOSIS — R2681 Unsteadiness on feet: Secondary | ICD-10-CM | POA: Diagnosis present

## 2014-04-11 LAB — CBC WITH DIFFERENTIAL/PLATELET
Basophils Absolute: 0 10*3/uL (ref 0.0–0.1)
Basophils Relative: 1 % (ref 0–1)
EOS ABS: 0 10*3/uL (ref 0.0–0.7)
Eosinophils Relative: 0 % (ref 0–5)
HCT: 31.9 % — ABNORMAL LOW (ref 36.0–46.0)
Hemoglobin: 10.7 g/dL — ABNORMAL LOW (ref 12.0–15.0)
Lymphocytes Relative: 16 % (ref 12–46)
Lymphs Abs: 1.1 10*3/uL (ref 0.7–4.0)
MCH: 28.4 pg (ref 26.0–34.0)
MCHC: 33.5 g/dL (ref 30.0–36.0)
MCV: 84.6 fL (ref 78.0–100.0)
MONOS PCT: 6 % (ref 3–12)
Monocytes Absolute: 0.4 10*3/uL (ref 0.1–1.0)
Neutro Abs: 5.4 10*3/uL (ref 1.7–7.7)
Neutrophils Relative %: 77 % (ref 43–77)
Platelets: 218 10*3/uL (ref 150–400)
RBC: 3.77 MIL/uL — AB (ref 3.87–5.11)
RDW: 13.8 % (ref 11.5–15.5)
WBC: 6.9 10*3/uL (ref 4.0–10.5)

## 2014-04-11 LAB — COMPREHENSIVE METABOLIC PANEL
ALK PHOS: 110 U/L (ref 39–117)
ALT: 18 U/L (ref 0–35)
ANION GAP: 13 (ref 5–15)
AST: 54 U/L — ABNORMAL HIGH (ref 0–37)
Albumin: 3 g/dL — ABNORMAL LOW (ref 3.5–5.2)
BUN: 11 mg/dL (ref 6–23)
CALCIUM: 9.4 mg/dL (ref 8.4–10.5)
CO2: 35 meq/L — AB (ref 19–32)
Chloride: 85 mEq/L — ABNORMAL LOW (ref 96–112)
Creatinine, Ser: 0.7 mg/dL (ref 0.50–1.10)
GFR, EST NON AFRICAN AMERICAN: 86 mL/min — AB (ref >90)
GLUCOSE: 114 mg/dL — AB (ref 70–99)
POTASSIUM: 2.2 meq/L — AB (ref 3.7–5.3)
Sodium: 133 mEq/L — ABNORMAL LOW (ref 137–147)
TOTAL PROTEIN: 7.3 g/dL (ref 6.0–8.3)
Total Bilirubin: 0.3 mg/dL (ref 0.3–1.2)

## 2014-04-11 LAB — D-DIMER, QUANTITATIVE: D-Dimer, Quant: 1.13 ug/mL-FEU — ABNORMAL HIGH (ref 0.00–0.48)

## 2014-04-11 LAB — TROPONIN I: Troponin I: <0.3 ng/mL (ref <0.30)

## 2014-04-11 MED ORDER — POTASSIUM CHLORIDE 10 MEQ/100ML IV SOLN
10.0000 meq | INTRAVENOUS | Status: AC
  Administered 2014-04-11 (×2): 10 meq via INTRAVENOUS
  Filled 2014-04-11 (×2): qty 100

## 2014-04-11 MED ORDER — IOHEXOL 350 MG/ML SOLN
100.0000 mL | Freq: Once | INTRAVENOUS | Status: AC | PRN
  Administered 2014-04-11: 100 mL via INTRAVENOUS

## 2014-04-11 MED ORDER — SODIUM CHLORIDE 0.9 % IV SOLN
Freq: Once | INTRAVENOUS | Status: AC
  Administered 2014-04-11: 22:00:00 via INTRAVENOUS

## 2014-04-11 MED ORDER — POTASSIUM CHLORIDE CRYS ER 20 MEQ PO TBCR
80.0000 meq | EXTENDED_RELEASE_TABLET | Freq: Once | ORAL | Status: AC
  Administered 2014-04-11: 80 meq via ORAL
  Filled 2014-04-11: qty 4

## 2014-04-11 MED ORDER — GUAIFENESIN 100 MG/5ML PO SYRP
200.0000 mg | ORAL_SOLUTION | ORAL | Status: DC | PRN
  Filled 2014-04-11: qty 10

## 2014-04-11 MED ORDER — POTASSIUM CHLORIDE 20 MEQ/15ML (10%) PO LIQD
40.0000 meq | Freq: Once | ORAL | Status: AC
  Administered 2014-04-11: 40 meq via ORAL
  Filled 2014-04-11: qty 30

## 2014-04-11 MED ORDER — DEXTROSE 5 % IV SOLN
1.0000 g | Freq: Once | INTRAVENOUS | Status: AC
  Administered 2014-04-11: 1 g via INTRAVENOUS

## 2014-04-11 MED ORDER — CEFTRIAXONE SODIUM 1 G IJ SOLR
INTRAMUSCULAR | Status: AC
  Filled 2014-04-11: qty 10

## 2014-04-11 MED ORDER — POTASSIUM CHLORIDE 10 MEQ/100ML IV SOLN: 10.0000 meq | Freq: Once | INTRAVENOUS | Status: DC

## 2014-04-11 MED ORDER — DEXTROSE 5 % IV SOLN
500.0000 mg | Freq: Once | INTRAVENOUS | Status: AC
  Administered 2014-04-11: 500 mg via INTRAVENOUS
  Filled 2014-04-11: qty 500

## 2014-04-11 NOTE — ED Notes (Addendum)
Critical K+ 2.2 received from lab.  Dr. Rosalia Hammers notified.

## 2014-04-11 NOTE — ED Notes (Signed)
Pt requesting to take methadone 5 mg from home which she takes 4 times daily

## 2014-04-11 NOTE — ED Provider Notes (Signed)
CSN: [id number]     Arrival date & time 04/11/14  1536 History  This chart was scribed for Hilario Quarry, MD by Tonye Royalty, ED Scribe. This patient was seen in room MH05/MH05 and the patient's care was started at 4:33 PM.    Chief Complaint  Patient presents with  . Constipation   Patient is a 68 y.o. female presenting with constipation. The history is provided by the patient. No language interpreter was used.  Constipation Timing:  Constant Progression:  Unchanged Chronicity:  Recurrent Context: narcotics   Context: not dehydration   Stool description:  None produced Relieved by:  Nothing Worsened by:  Nothing tried Ineffective treatments: milk of magnesia, MiraLAX. Associated symptoms: no fever     HPI Comments: [name] is a 68 y.o. female with history of constipation who presents to the Emergency Department complaining of constipation. She states she has not had a bowel movement in 4 days. She states she is eating, hydrating, and walking as per usual. She reports using milk of magnesia and MiraLAX without remission of symptoms. She reports using all the treatment recommended by her PCP who follows her recurrent constipation; her medications list includes Colase, Chronulac, and Senokot. She states she has been on Methadone for the past 3 years for pelvic mesh but states her history of constipation preceded that; she also reports starting Topamax 3 weeks ago and states she did not take it this morning. She also reports history of migraines. She reports difficulty talking due to shortness of breath with onset several days ago, dry cough, and panic attacks. She states she does not smoke. She denies fever.  Past Medical History  Diagnosis Date  . GERD (gastroesophageal reflux disease)   . Anxiety   . Chronic pain   . Constipation   . Migraine    Past Surgical History  Procedure Laterality Date  . Appendectomy    . Bladder surgery      Mesh implanted  . Shoulder surgery       X 2  . Tonsillectomy    . Abdominal wall mesh  removal     Family History  Problem Relation Age of Onset  . Coronary artery disease Father 29  . Coronary artery disease Mother 24   History  Substance Use Topics  . Smoking status: Never Smoker   . Smokeless tobacco: Not on file  . Alcohol Use: No   OB History   Grav Para Term Preterm Abortions TAB SAB Ect Mult Living                 Review of Systems  Constitutional: Negative for fever.  Respiratory: Positive for cough and shortness of breath.   Gastrointestinal: Positive for constipation.  Psychiatric/Behavioral: The patient is nervous/anxious (panic attacks).   All other systems reviewed and are negative.     Allergies  Ativan and Sulfa antibiotics  Home Medications   Prior to Admission medications   Medication Sig Start Date End Date Taking? Authorizing Provider  levETIRAcetam (KEPPRA) 250 MG tablet Take 250 mg by mouth 2 (two) times daily.   Yes Historical Provider, MD  methadone (DOLOPHINE) 5 MG tablet Take 5 mg by mouth every 6 (six) hours.   Yes Historical Provider, MD  metoprolol (LOPRESSOR) 50 MG tablet Take 50 mg by mouth 2 (two) times daily.   Yes Historical Provider, MD  Naloxegol Oxalate 25 MG TABS Take 25 mg by mouth daily.   Yes Historical Provider, MD  topiramate (  TOPAMAX) 25 MG tablet Take 25 mg by mouth 2 (two) times daily. Take four capsules twice daily   Yes Historical Provider, MD  ALPRAZolam Prudy Feeler) 1 MG tablet Take 1 mg by mouth at bedtime as needed for anxiety.    Historical Provider, MD  divalproex (DEPAKOTE ER) 250 MG 24 hr tablet Take 1 pill each night for 2 weeks, then 2 pills PO thereafter. 12/22/13   Huston Foley, MD  docusate sodium (COLACE) 100 MG capsule Take 100 mg by mouth 2 (two) times daily.    Historical Provider, MD  glycerin adult (GLYCERIN ADULT) 2 G SUPP Place 1 suppository rectally once.    Historical Provider, MD  imipramine (TOFRANIL) 50 MG tablet Take 1 tablet by mouth 2 (two)  times daily.    Historical Provider, MD  lactulose (CHRONULAC) 10 GM/15ML solution Take 2 mLs by mouth 2 (two) times daily. 12/16/13   Historical Provider, MD  omeprazole (PRILOSEC) 20 MG capsule Take 1 capsule by mouth daily.    Historical Provider, MD  oxyCODONE (OXY IR/ROXICODONE) 5 MG immediate release tablet Take 5 mg by mouth every 4 (four) hours as needed for severe pain.    Historical Provider, MD  polyethylene glycol (MIRALAX / GLYCOLAX) packet Take 17 g by mouth daily.    Historical Provider, MD  senna (SENOKOT) 8.6 MG tablet Take 2 tablets by mouth daily.    Historical Provider, MD  traZODone (DESYREL) 100 MG tablet Take 1 tablet by mouth at bedtime. 01/31/13   Historical Provider, MD  zolpidem (AMBIEN) 10 MG tablet Take 10 mg by mouth at bedtime as needed for sleep.    Historical Provider, MD   BP 99/84  Pulse 87  Temp(Src) 98.9 F (37.2 C) (Oral)  Resp 24  SpO2 98% Physical Exam  ED Course  Procedures (including critical care time) Labs Review Labs Reviewed - No data to display  Imaging Review Dg Chest 2 View  04/11/2014   CLINICAL DATA:  Shortness of breath.  EXAM: CHEST  2 VIEW  COMPARISON:  None available currently.  FINDINGS: The heart size and mediastinal contours are within normal limits. Both lungs are clear. No pneumothorax or pleural effusion is noted. The visualized skeletal structures are unremarkable.  IMPRESSION: No acute cardiopulmonary abnormality seen.   Electronically Signed   By: Roque Lias M.D.   On: 04/11/2014 18:12   Dg Abd 1 View  04/11/2014   CLINICAL DATA:  Constipation.  No bowel movement for 4 days.  EXAM: ABDOMEN - 1 VIEW  COMPARISON:  Radiographs 10/01/2013.  CT 03/05/2011.  FINDINGS: There is mild diffuse gaseous distention of the colon. No significant retained feces demonstrated. There are probable pill fragments within the pelvis, fewer than noted previously. At least one of these extends laterally beyond the margin of the bladder. No suspicious  calcifications are seen. There are mild degenerative changes throughout the spine.  IMPRESSION: Gaseous distention of the colon without radiographic evidence of significant constipation or obstruction.   Electronically Signed   By: Roxy Horseman M.D.   On: 04/11/2014 18:10   Ct Angio Chest Pe W/cm &/or Wo Cm  04/11/2014   CLINICAL DATA:  Shortness of breath, chest pain.  EXAM: CT ANGIOGRAPHY CHEST WITH CONTRAST  TECHNIQUE: Multidetector CT imaging of the chest was performed using the standard protocol during bolus administration of intravenous contrast. Multiplanar CT image reconstructions and MIPs were obtained to evaluate the vascular anatomy.  CONTRAST:  OMNIPAQUE IOHEXOL 350 MG/ML SOLN  COMPARISON:  CT scan of July 31, 2010.  FINDINGS: No pneumothorax or pleural effusion is noted. Mild multiple cul areas of airspace opacity are noted throughout both lungs suggesting inflammation or pneumonia. There is no evidence of pulmonary embolus. There is no evidence of thoracic aortic dissection or aneurysm. Mild coronary artery calcifications are noted. No mediastinal mass or adenopathy is noted. Visualized portion of upper abdomen appears normal. No significant osseous abnormality is noted.  Review of the MIP images confirms the above findings.  IMPRESSION: There is no evidence of pulmonary embolus. Mild multifocal areas of airspace opacity are noted throughout both lungs suggesting multifocal pneumonia or inflammation.   Electronically Signed   By: Roque Lias M.D.   On: 04/11/2014 19:35     EKG Interpretation   Date/Time:  Sunday April 11 2014 16:51:43 EDT Ventricular Rate:  77 PR Interval:  164 QRS Duration: 98 QT Interval:  564 QTC Calculation: 638 R Axis:   77 Text Interpretation:   Critical Test Result: Long QTc Normal sinus  rhythm Prolonged QT Abnormal ECG Confirmed by Mackinzee Roszak MD, Duwayne Heck ([id number]) on  04/11/2014 8:11:31 PM     DIAGNOSTIC STUDIES: Oxygen Saturation is 98% on room air,  normal by my interpretation.    COORDINATION OF CARE: 4:42 PM Discussed treatment plan with patient at beside, the patient agrees with the plan and has no further questions at this time.    MDM   Final diagnoses:  Hypokalemia  Prolonged Q-T interval on ECG  CAP (community acquired pneumonia)   68 year old female presents as a finding of constipation. She is on methadone and has chronic constipation. Exam here reveals diffuse distention of her abdomen. Pain x-rays reveal increased gaseous distention without evidence of constipation or obstipation. Lab x-rays reveal severe hypokalemia with prolonged QT noted on EKG. Patient also complained of chest pain, cough, and dyspnea. CT obtained shows multifocal multilobar pneumonia. Patient is treated for community-acquired pneumonia with Rocephin and Zithromax here. Oxygen saturations are 90-92% the patient is placed on nasal cannula with good oxygenation noted.  1-abdominal distension- likely secondary to narcotics.   Patient's gi md is Dr.Buccinni with [facility]. 2- hypokalemia- likely secondary to use of laxatives 3- prolonged qt- secondary to 2 4-cap  CRITICAL CARE Performed by: Marilyne Haseley S Total critical care time: 60 Critical care time was exclusive of separately billable procedures and treating other patients. Critical care was necessary to treat or prevent imminent or life-threatening deterioration. Critical care was time spent personally by me on the following activities: development of treatment plan with patient and/or surrogate as well as nursing, discussions with consultants, evaluation of patient's response to treatment, examination of patient, obtaining history from patient or surrogate, ordering and performing treatments and interventions, ordering and review of laboratory studies, ordering and review of radiographic studies, pulse oximetry and re-evaluation of patient's condition.  Discussed with Dr. Clyde Lundborg and patient accepted to  telemetry bed.  I personally performed the services described in this documentation, which was scribed in my presence. The recorded information has been reviewed and considered.   Hilario Quarry, MD 04/11/14 [phone]

## 2014-04-11 NOTE — ED Notes (Signed)
Critical Value K+ 2.2. Primary RN notified.

## 2014-04-11 NOTE — ED Notes (Signed)
MD at bedside. 

## 2014-04-11 NOTE — ED Notes (Signed)
Order was entered for Robitussin per pt request but we do not have that medicine here. Pt notified.

## 2014-04-11 NOTE — ED Notes (Signed)
Pt requesting Robitussin cough med for her dry cough.  Sts she takes it at home but forgot to bring it with her.  MD notified.

## 2014-04-11 NOTE — ED Notes (Signed)
Pt verbalizes she is hyperventilating but patient has unlabored regular breathing. Appears in no distress. Pt reports coming off topamax and takes methadone.

## 2014-04-11 NOTE — ED Notes (Signed)
Patient states that she has not had a bowel movement for the past four days and is currently taking methadone. Also suffers from panic attacks

## 2014-04-11 NOTE — ED Notes (Signed)
Patient transported to CT 

## 2014-04-12 ENCOUNTER — Encounter (HOSPITAL_COMMUNITY): Payer: Self-pay | Admitting: *Deleted

## 2014-04-12 DIAGNOSIS — J189 Pneumonia, unspecified organism: Principal | ICD-10-CM

## 2014-04-12 DIAGNOSIS — F411 Generalized anxiety disorder: Secondary | ICD-10-CM

## 2014-04-12 DIAGNOSIS — E876 Hypokalemia: Secondary | ICD-10-CM

## 2014-04-12 DIAGNOSIS — G8929 Other chronic pain: Secondary | ICD-10-CM

## 2014-04-12 LAB — BASIC METABOLIC PANEL
ANION GAP: 14 (ref 5–15)
BUN: 7 mg/dL (ref 6–23)
CHLORIDE: 94 meq/L — AB (ref 96–112)
CO2: 28 mEq/L (ref 19–32)
CREATININE: 0.61 mg/dL (ref 0.50–1.10)
Calcium: 9.3 mg/dL (ref 8.4–10.5)
GFR calc non Af Amer: >90 mL/min (ref >90)
Glucose, Bld: 82 mg/dL (ref 70–99)
Potassium: 3 mEq/L — ABNORMAL LOW (ref 3.7–5.3)
SODIUM: 136 meq/L — AB (ref 137–147)

## 2014-04-12 LAB — CBC
HEMATOCRIT: 34.4 % — AB (ref 36.0–46.0)
Hemoglobin: 11.4 g/dL — ABNORMAL LOW (ref 12.0–15.0)
MCH: 28.3 pg (ref 26.0–34.0)
MCHC: 33.1 g/dL (ref 30.0–36.0)
MCV: 85.4 fL (ref 78.0–100.0)
Platelets: 260 10*3/uL (ref 150–400)
RBC: 4.03 MIL/uL (ref 3.87–5.11)
RDW: 13.9 % (ref 11.5–15.5)
WBC: 6.3 10*3/uL (ref 4.0–10.5)

## 2014-04-12 LAB — LEGIONELLA ANTIGEN, URINE: Legionella Antigen, Urine: NEGATIVE

## 2014-04-12 LAB — SEDIMENTATION RATE: Sed Rate: 85 mm/hr — ABNORMAL HIGH (ref 0–22)

## 2014-04-12 LAB — TSH: TSH: 1.38 u[IU]/mL (ref 0.350–4.500)

## 2014-04-12 LAB — C-REACTIVE PROTEIN: CRP: 14.5 mg/dL — ABNORMAL HIGH (ref <0.60)

## 2014-04-12 LAB — CREATININE, SERUM
Creatinine, Ser: 0.62 mg/dL (ref 0.50–1.10)
GFR calc non Af Amer: 90 mL/min — ABNORMAL LOW (ref >90)

## 2014-04-12 LAB — STREP PNEUMONIAE URINARY ANTIGEN: Strep Pneumo Urinary Antigen: NEGATIVE

## 2014-04-12 MED ORDER — SODIUM CHLORIDE 0.9 % IJ SOLN
3.0000 mL | Freq: Two times a day (BID) | INTRAMUSCULAR | Status: DC
  Administered 2014-04-12 (×2): 3 mL via INTRAVENOUS

## 2014-04-12 MED ORDER — METHADONE HCL 10 MG PO TABS
5.0000 mg | ORAL_TABLET | Freq: Four times a day (QID) | ORAL | Status: DC
  Administered 2014-04-12: 5 mg via ORAL
  Filled 2014-04-12: qty 1

## 2014-04-12 MED ORDER — DEXTROSE 5 % IV SOLN
500.0000 mg | INTRAVENOUS | Status: DC
  Administered 2014-04-13: 500 mg via INTRAVENOUS
  Filled 2014-04-12 (×3): qty 500

## 2014-04-12 MED ORDER — LEVETIRACETAM 250 MG PO TABS
250.0000 mg | ORAL_TABLET | Freq: Two times a day (BID) | ORAL | Status: DC
  Filled 2014-04-12 (×4): qty 1

## 2014-04-12 MED ORDER — GLYCERIN (LAXATIVE) 2 G RE SUPP: 1.0000 | Freq: Once | RECTAL | Status: DC

## 2014-04-12 MED ORDER — HYDROCODONE-ACETAMINOPHEN 5-325 MG PO TABS: 1.0000 | ORAL_TABLET | ORAL | Status: DC | PRN

## 2014-04-12 MED ORDER — TOPIRAMATE 25 MG PO TABS
25.0000 mg | ORAL_TABLET | Freq: Two times a day (BID) | ORAL | Status: DC
  Administered 2014-04-13: 25 mg via ORAL
  Filled 2014-04-12 (×4): qty 1

## 2014-04-12 MED ORDER — ALBUTEROL SULFATE (2.5 MG/3ML) 0.083% IN NEBU: 2.5000 mg | INHALATION_SOLUTION | RESPIRATORY_TRACT | Status: DC | PRN

## 2014-04-12 MED ORDER — DEXTROSE 5 % IV SOLN
1.0000 g | INTRAVENOUS | Status: DC
  Administered 2014-04-13: 1 g via INTRAVENOUS
  Filled 2014-04-12 (×3): qty 10

## 2014-04-12 MED ORDER — ONDANSETRON HCL 4 MG PO TABS: 4.0000 mg | ORAL_TABLET | Freq: Four times a day (QID) | ORAL | Status: DC | PRN

## 2014-04-12 MED ORDER — ACETAMINOPHEN 325 MG PO TABS: 650.0000 mg | ORAL_TABLET | Freq: Four times a day (QID) | ORAL | Status: DC | PRN

## 2014-04-12 MED ORDER — POLYETHYLENE GLYCOL 3350 17 G PO PACK
17.0000 g | PACK | Freq: Two times a day (BID) | ORAL | Status: DC
  Administered 2014-04-12 – 2014-04-13 (×2): 17 g via ORAL
  Filled 2014-04-12 (×4): qty 1

## 2014-04-12 MED ORDER — ALPRAZOLAM 0.5 MG PO TABS
1.0000 mg | ORAL_TABLET | Freq: Every evening | ORAL | Status: DC | PRN
  Administered 2014-04-12: 1 mg via ORAL
  Filled 2014-04-12: qty 2

## 2014-04-12 MED ORDER — ENOXAPARIN SODIUM 40 MG/0.4ML ~~LOC~~ SOLN
40.0000 mg | SUBCUTANEOUS | Status: DC
  Administered 2014-04-12 – 2014-04-13 (×2): 40 mg via SUBCUTANEOUS
  Filled 2014-04-12 (×3): qty 0.4

## 2014-04-12 MED ORDER — TRAZODONE HCL 100 MG PO TABS
100.0000 mg | ORAL_TABLET | Freq: Every day | ORAL | Status: DC
  Administered 2014-04-12: 100 mg via ORAL
  Filled 2014-04-12 (×3): qty 1

## 2014-04-12 MED ORDER — ALUM & MAG HYDROXIDE-SIMETH 200-200-20 MG/5ML PO SUSP
15.0000 mL | Freq: Four times a day (QID) | ORAL | Status: DC | PRN
  Administered 2014-04-12 – 2014-04-13 (×2): 15 mL via ORAL
  Filled 2014-04-12 (×2): qty 30

## 2014-04-12 MED ORDER — ZOLPIDEM TARTRATE 5 MG PO TABS
10.0000 mg | ORAL_TABLET | Freq: Every evening | ORAL | Status: DC | PRN
  Administered 2014-04-12 (×2): 10 mg via ORAL
  Filled 2014-04-12 (×2): qty 2

## 2014-04-12 MED ORDER — POLYETHYLENE GLYCOL 3350 17 G PO PACK
17.0000 g | PACK | Freq: Every day | ORAL | Status: DC
  Administered 2014-04-12: 17 g via ORAL
  Filled 2014-04-12: qty 1

## 2014-04-12 MED ORDER — METHADONE HCL 10 MG PO TABS
5.0000 mg | ORAL_TABLET | Freq: Three times a day (TID) | ORAL | Status: DC
  Administered 2014-04-12 – 2014-04-13 (×5): 5 mg via ORAL
  Filled 2014-04-12 (×6): qty 1

## 2014-04-12 MED ORDER — METOPROLOL TARTRATE 50 MG PO TABS
50.0000 mg | ORAL_TABLET | Freq: Two times a day (BID) | ORAL | Status: DC
  Administered 2014-04-12: 50 mg via ORAL
  Filled 2014-04-12 (×4): qty 1

## 2014-04-12 MED ORDER — DOCUSATE SODIUM 100 MG PO CAPS
100.0000 mg | ORAL_CAPSULE | Freq: Two times a day (BID) | ORAL | Status: DC
  Administered 2014-04-12 – 2014-04-13 (×3): 100 mg via ORAL
  Filled 2014-04-12 (×4): qty 1

## 2014-04-12 MED ORDER — POTASSIUM CHLORIDE IN NACL 20-0.9 MEQ/L-% IV SOLN
INTRAVENOUS | Status: DC
  Administered 2014-04-12 (×2): via INTRAVENOUS
  Filled 2014-04-12 (×3): qty 1000

## 2014-04-12 MED ORDER — ALPRAZOLAM 0.5 MG PO TABS
1.0000 mg | ORAL_TABLET | Freq: Three times a day (TID) | ORAL | Status: DC | PRN
  Administered 2014-04-12 – 2014-04-13 (×3): 1 mg via ORAL
  Filled 2014-04-12 (×3): qty 2

## 2014-04-12 MED ORDER — PANTOPRAZOLE SODIUM 40 MG PO TBEC
40.0000 mg | DELAYED_RELEASE_TABLET | Freq: Every day | ORAL | Status: DC
  Administered 2014-04-12 – 2014-04-13 (×2): 40 mg via ORAL
  Filled 2014-04-12 (×2): qty 1

## 2014-04-12 MED ORDER — LACTULOSE 10 GM/15ML PO SOLN
1.3333 g | Freq: Two times a day (BID) | ORAL | Status: DC
  Administered 2014-04-12 – 2014-04-13 (×3): 1.3333 g via ORAL
  Filled 2014-04-12 (×4): qty 15

## 2014-04-12 MED ORDER — GLYCERIN (LAXATIVE) 2 G RE SUPP
1.0000 | Freq: Once | RECTAL | Status: AC
  Administered 2014-04-12: 1 via RECTAL
  Filled 2014-04-12: qty 1

## 2014-04-12 MED ORDER — GUAIFENESIN ER 600 MG PO TB12
1200.0000 mg | ORAL_TABLET | Freq: Two times a day (BID) | ORAL | Status: DC
  Administered 2014-04-12 – 2014-04-13 (×3): 1200 mg via ORAL
  Filled 2014-04-12 (×5): qty 2

## 2014-04-12 MED ORDER — ACETAMINOPHEN 650 MG RE SUPP: 650.0000 mg | Freq: Four times a day (QID) | RECTAL | Status: DC | PRN

## 2014-04-12 MED ORDER — ONDANSETRON HCL 4 MG/2ML IJ SOLN: 4.0000 mg | Freq: Four times a day (QID) | INTRAMUSCULAR | Status: DC | PRN

## 2014-04-12 MED ORDER — ALBUTEROL SULFATE (2.5 MG/3ML) 0.083% IN NEBU
2.5000 mg | INHALATION_SOLUTION | Freq: Four times a day (QID) | RESPIRATORY_TRACT | Status: DC
  Administered 2014-04-12: 2.5 mg via RESPIRATORY_TRACT
  Filled 2014-04-12: qty 3

## 2014-04-12 MED ORDER — INFLUENZA VAC SPLIT QUAD 0.5 ML IM SUSY
0.5000 mL | PREFILLED_SYRINGE | INTRAMUSCULAR | Status: AC
  Administered 2014-04-12: 0.5 mL via INTRAMUSCULAR
  Filled 2014-04-12: qty 0.5

## 2014-04-12 MED ORDER — OXYCODONE HCL 5 MG PO TABS: 5.0000 mg | ORAL_TABLET | ORAL | Status: DC | PRN

## 2014-04-12 NOTE — Progress Notes (Signed)
Patient demanding copious amounts of water. Education about consumption of fluids was given. Patient still continues to drink large amounts of fluid. Will continue to monitor.  Valinda Hoar RN

## 2014-04-12 NOTE — Progress Notes (Signed)
VASCULAR LAB PRELIMINARY  PRELIMINARY  PRELIMINARY  PRELIMINARY  Bilateral lower extremity venous duplex completed.    Preliminary report: Negative for deep and superficial vein thrombosis bilaterally.   Ha Shannahan, RVT 04/12/2014, 10:34 AM

## 2014-04-12 NOTE — H&P (Addendum)
[facility]                                                                                    Patient Demographics  [name], is a 68 y.o. female  CSN: [id number]  MRN: [id number]  DOB - 28-Jul-1945  Admit Date - 04/11/2014  Outpatient Primary MD for the patient is TRENT, Lesleigh Noe, PA-C   With History of -  Past Medical History  Diagnosis Date  . GERD (gastroesophageal reflux disease)   . Anxiety   . Chronic pain   . Constipation   . Migraine       Past Surgical History  Procedure Laterality Date  . Appendectomy    . Bladder surgery      Mesh implanted  . Shoulder surgery      X 2  . Tonsillectomy    . Abdominal wall mesh  removal      in for   Chief Complaint  Patient presents with  . Constipation     HPI  [name]  is a 68 y.o. female, with past medical history significant for chronic constipation who presented to [facility] with multiple complaints including worsening constipation with abdominal discomfort for the last 4 days and shortness of breath with nonproductive cough. No history of nausea and vomiting, and the nurses noticed multiple episodes of runny stools in our hospital. Patient denies any chest pains, headaches, nausea or vomiting. She was noticed to be hypokalemic with prolonged QT on her EKG. Her chest CT showed multifocal pneumonia versus inflammation. Patient was transferred to a telemetry unit for further evaluation. Patient has been battling constipation since her teens her and this has gotten worse with methadone that was started 3 years ago for pain related to her vaginal mesh.    Review of Systems    In addition to the HPI above,  No Fever-chills, No Headache, No changes with Vision or hearing, No problems swallowing food or Liquids, No Chest pain,  , No Nausea or Vommitting, No Blood in stool or Urine, No dysuria, No new skin rashes or bruises, No new joints pains-aches,  No new weakness, tingling, numbness in any  extremity, No recent weight gain or loss, No polyuria, polydypsia or polyphagia, No significant Mental Stressors.  A full 10 point Review of Systems was done, except as stated above, all other Review of Systems were negative.   Social History History  Substance Use Topics  . Smoking status: Never Smoker   . Smokeless tobacco: Not on file  . Alcohol Use: No     Family History Family History  Problem Relation Age of Onset  . Coronary artery disease Father 32  . Coronary artery disease Mother 26     Prior to Admission medications   Medication Sig Start Date End Date Taking? Authorizing Provider  levETIRAcetam (KEPPRA) 250 MG tablet Take 250 mg by mouth 2 (two) times daily.   Yes Historical Provider, MD  methadone (DOLOPHINE) 5 MG tablet Take 5 mg by mouth every 6 (six) hours.   Yes Historical Provider, MD  metoprolol (LOPRESSOR) 50 MG tablet Take 50 mg by mouth 2 (two) times daily.  Yes Historical Provider, MD  Naloxegol Oxalate 25 MG TABS Take 25 mg by mouth daily.   Yes Historical Provider, MD  topiramate (TOPAMAX) 25 MG tablet Take 25 mg by mouth 2 (two) times daily. Take four capsules twice daily   Yes Historical Provider, MD  ALPRAZolam Duanne Moron) 1 MG tablet Take 1 mg by mouth at bedtime as needed for anxiety.    Historical Provider, MD  divalproex (DEPAKOTE ER) 250 MG 24 hr tablet Take 1 pill each night for 2 weeks, then 2 pills PO thereafter. 12/22/13   Star Age, MD  docusate sodium (COLACE) 100 MG capsule Take 100 mg by mouth 2 (two) times daily.    Historical Provider, MD  glycerin adult (GLYCERIN ADULT) 2 G SUPP Place 1 suppository rectally once.    Historical Provider, MD  imipramine (TOFRANIL) 50 MG tablet Take 1 tablet by mouth 2 (two) times daily.    Historical Provider, MD  lactulose (CHRONULAC) 10 GM/15ML solution Take 2 mLs by mouth 2 (two) times daily. 12/16/13   Historical Provider, MD  omeprazole (PRILOSEC) 20 MG capsule Take 1 capsule by mouth daily.     Historical Provider, MD  oxyCODONE (OXY IR/ROXICODONE) 5 MG immediate release tablet Take 5 mg by mouth every 4 (four) hours as needed for severe pain.    Historical Provider, MD  polyethylene glycol (MIRALAX / GLYCOLAX) packet Take 17 g by mouth daily.    Historical Provider, MD  senna (SENOKOT) 8.6 MG tablet Take 2 tablets by mouth daily.    Historical Provider, MD  traZODone (DESYREL) 100 MG tablet Take 1 tablet by mouth at bedtime. 01/31/13   Historical Provider, MD  zolpidem (AMBIEN) 10 MG tablet Take 10 mg by mouth at bedtime as needed for sleep.    Historical Provider, MD    Allergies  Allergen Reactions  . Ativan [Lorazepam]   . Sulfa Antibiotics Hives    unknown    Physical Exam  Vitals  Blood pressure 132/69, pulse 81, temperature 98 F (36.7 C), temperature source Oral, resp. rate 20, height 5' 3.5" (1.613 m), weight 53.842 kg (118 lb 11.2 oz), SpO2 100.00%.   1. General elderly white lady who looks younger than age, very pleasant  2. Normal affect and insight, Not Suicidal or Homicidal, Awake Alert, Oriented X 3.  3. No F.N deficits grossly, ALL C.Nerves Intact,   4. Ears and Eyes appear Normal, Conjunctivae clear, PERRLA. Moist Oral Mucosa.  5. Supple Neck, No JVD, No cervical lymphadenopathy appriciated, No Carotid Bruits.  6. Symmetrical Chest wall movement, Good air movement bilaterally,   7. RRR, No Gallops, Rubs or Murmurs, No Parasternal Heave.  8. increased Bowel Sounds, Abdomen protuberant and distended,  9.  No Cyanosis, Normal Skin Turgor, No Skin Rash or Bruise.  10. Good muscle tone,  joints appear normal , no effusions, Normal ROM.  11. No Palpable Lymph Nodes in Neck or Axillae    Data Review  CBC  Recent Labs Lab 04/11/14 1700  WBC 6.9  HGB 10.7*  HCT 31.9*  PLT 218  MCV 84.6  MCH 28.4  MCHC 33.5  RDW 13.8  LYMPHSABS 1.1  MONOABS 0.4  EOSABS 0.0  BASOSABS 0.0    ------------------------------------------------------------------------------------------------------------------  Chemistries   Recent Labs Lab 04/11/14 1700  NA 133*  K 2.2*  CL 85*  CO2 35*  GLUCOSE 114*  BUN 11  CREATININE 0.70  CALCIUM 9.4  AST 54*  ALT 18  ALKPHOS 110  BILITOT 0.3   ------------------------------------------------------------------------------------------------------------------  estimated creatinine clearance is 56.2 ml/min (by C-G formula based on Cr of 0.7). ------------------------------------------------------------------------------------------------------------------ No results found for this basename: TSH, T4TOTAL, FREET3, T3FREE, THYROIDAB,  in the last 72 hours   Coagulation profile No results found for this basename: INR, PROTIME,  in the last 168 hours -------------------------------------------------------------------------------------------------------------------  Recent Labs  04/11/14 1700  DDIMER 1.13*   -------------------------------------------------------------------------------------------------------------------  Cardiac Enzymes  Recent Labs Lab 04/11/14 1700  TROPONINI <0.30   ------------------------------------------------------------------------------------------------------------------ No components found with this basename: POCBNP,    Imaging results:   Dg Chest 2 View  04/11/2014   CLINICAL DATA:  Shortness of breath.  EXAM: CHEST  2 VIEW  COMPARISON:  None available currently.  FINDINGS: The heart size and mediastinal contours are within normal limits. Both lungs are clear. No pneumothorax or pleural effusion is noted. The visualized skeletal structures are unremarkable.  IMPRESSION: No acute cardiopulmonary abnormality seen.   Electronically Signed   By: Sabino Dick M.D.   On: 04/11/2014 18:12   Dg Abd 1 View  04/11/2014   CLINICAL DATA:  Constipation.  No bowel movement for 4 days.  EXAM: ABDOMEN - 1  VIEW  COMPARISON:  Radiographs 10/01/2013.  CT 03/05/2011.  FINDINGS: There is mild diffuse gaseous distention of the colon. No significant retained feces demonstrated. There are probable pill fragments within the pelvis, fewer than noted previously. At least one of these extends laterally beyond the margin of the bladder. No suspicious calcifications are seen. There are mild degenerative changes throughout the spine.  IMPRESSION: Gaseous distention of the colon without radiographic evidence of significant constipation or obstruction.   Electronically Signed   By: Camie Patience M.D.   On: 04/11/2014 18:10   Ct Angio Chest Pe W/cm &/or Wo Cm  04/11/2014   CLINICAL DATA:  Shortness of breath, chest pain.  EXAM: CT ANGIOGRAPHY CHEST WITH CONTRAST  TECHNIQUE: Multidetector CT imaging of the chest was performed using the standard protocol during bolus administration of intravenous contrast. Multiplanar CT image reconstructions and MIPs were obtained to evaluate the vascular anatomy.  CONTRAST:  149m OMNIPAQUE IOHEXOL 350 MG/ML SOLN  COMPARISON:  CT scan of July 31, 2010.  FINDINGS: No pneumothorax or pleural effusion is noted. Mild multiple cul areas of airspace opacity are noted throughout both lungs suggesting inflammation or pneumonia. There is no evidence of pulmonary embolus. There is no evidence of thoracic aortic dissection or aneurysm. Mild coronary artery calcifications are noted. No mediastinal mass or adenopathy is noted. Visualized portion of upper abdomen appears normal. No significant osseous abnormality is noted.  Review of the MIP images confirms the above findings.  IMPRESSION: There is no evidence of pulmonary embolus. Mild multifocal areas of airspace opacity are noted throughout both lungs suggesting multifocal pneumonia or inflammation.   Electronically Signed   By: JSabino DickM.D.   On: 04/11/2014 19:35    My personal review of EKG: Prolonged QT with normal sinus rhythm   Assessment &  Plan  1. multifocal, community acquired pneumonia versus inflammation    Start Rocephin and Zithromax    Check cultures    Check ESR and CRP  2. Constipation, acute worsening with abdominal discomfort     Nurses report multiple BMs but runny     Continue same medications at this time     Will take it easy because of hypokalemia and prolonged QT on EKG     Consult gastroenterology in a.m.  3. Hypokalemia     Replace IV  Check potassium level in a.m.  4. Prolonged QT     Follow EKG in a.m. after potassium is repleted and consider cardiology consult if no improvement.  5. Chronic pain syndrome     Continue with methadone  6. Elevated d-dimers probably infection related     Chest CT is negative for PE     Check lower extremity Dopplers    Follow D. dimers              DVT Lovenox  AM Labs Ordered, also please review Full Orders    Code Status full  Disposition Plan: Home  Time spent in minutes : 33 minutes  Condition GUARDED   _0 @

## 2014-04-12 NOTE — Care Management Note (Unsigned)
    Page 1 of 1   04/12/2014     3:28:59 PM CARE MANAGEMENT NOTE 04/12/2014  Patient:  [name], [name]   Account Number:  [id number]  Date Initiated:  04/12/2014  Documentation initiated by:  Katelynn Heidler  Subjective/Objective Assessment:   Pt adm on 04/11/14 with PNA, constipation, prolonged QTC. PTA, pt resides at home with spouse.     Action/Plan:   Will follow for dc needs as pt progresses.   Anticipated DC Date:  04/14/2014   Anticipated DC Plan:  HOME/SELF CARE      DC Planning Services  CM consult      Choice offered to / List presented to:             Status of service:  In process, will continue to follow Medicare Important Message given?   (If response is "NO", the following Medicare IM given date fields will be blank) Date Medicare IM given:   Medicare IM given by:   Date Additional Medicare IM given:   Additional Medicare IM given by:    Discharge Disposition:    Per UR Regulation:  Reviewed for med. necessity/level of care/duration of stay  If discussed at Long Length of Stay Meetings, dates discussed:    Comments:

## 2014-04-12 NOTE — Progress Notes (Addendum)
Pt seen and examined 68/F with chronic pain on methadone, chronic constipation Admitted with constipation and Multifocal community acquired pneumonia -resume laxatives, stool softeners -Continue rocephin, zithromax -check urine legionella pneumo Ag -prolong QTC-improving, EKG in am  Zannie Cove, MD [phone]

## 2014-04-13 ENCOUNTER — Inpatient Hospital Stay (HOSPITAL_COMMUNITY): Payer: Medicare Other

## 2014-04-13 LAB — BASIC METABOLIC PANEL
Anion gap: 12 (ref 5–15)
BUN: 6 mg/dL (ref 6–23)
CO2: 23 mEq/L (ref 19–32)
Calcium: 9.1 mg/dL (ref 8.4–10.5)
Chloride: 102 mEq/L (ref 96–112)
Creatinine, Ser: 0.58 mg/dL (ref 0.50–1.10)
GFR calc Af Amer: >90 mL/min (ref >90)
GFR calc non Af Amer: >90 mL/min (ref >90)
Glucose, Bld: 87 mg/dL (ref 70–99)
Potassium: 3.5 mEq/L — ABNORMAL LOW (ref 3.7–5.3)
SODIUM: 137 meq/L (ref 137–147)

## 2014-04-13 MED ORDER — POLYETHYLENE GLYCOL 3350 17 G PO PACK: 17.0000 g | PACK | Freq: Two times a day (BID) | ORAL | Status: DC | PRN

## 2014-04-13 MED ORDER — LEVOFLOXACIN 500 MG PO TABS: 500.0000 mg | ORAL_TABLET | Freq: Every day | ORAL | Status: DC

## 2014-04-13 MED ORDER — LACTULOSE 10 GM/15ML PO SOLN
10.0000 g | Freq: Two times a day (BID) | ORAL | Status: DC
  Filled 2014-04-13: qty 15

## 2014-04-13 NOTE — Progress Notes (Signed)
Pt discharge education and instructions completed with pt and spouse at bedside. Both voices understanding and denies any questions. Pt IV and telemetry removed. Pt handed her prescription for Levaquin; pt discharge home with spouse to transport her home. Pt transported off unit via wheelchair with belongings and spouse at side. Arabella Merles Allicia Culley RN.

## 2014-04-13 NOTE — Discharge Summary (Signed)
Physician Discharge Summary  [name]:[id number] DOB: [birth year] DOA: 04/11/2014  PCP: Gwenlyn Perking, PA-C  Admit date: 04/11/2014 Discharge date: 04/13/2014  Time spent:  Recommendations for Outpatient Follow-up:  PCP in 1 week FU CXr in 4-6weeks  Discharge Diagnoses:   Multifocal pneumonia  Polypharmacy   Hypokalemia   Unstable gait   Anxiety   Chronic pain   Chronic constipation  Discharge Condition: stable Diet recommendation: regular Filed Weights   04/11/14 2259  Weight: 53.842 kg (118 lb 11.2 oz)    History of present illness:  [name] is a 68 y.o. female, with past medical history significant for chronic constipation who presented to [facility] with multiple complaints including worsening constipation with abdominal discomfort for the last 4 days and shortness of breath with nonproductive cough. No history of nausea and vomiting, and the nurses noticed multiple episodes of runny stools in our hospital. Patient denies any chest pains, headaches, nausea or vomiting. She was noticed to be hypokalemic with prolonged QT on her EKG. Her chest CT showed multifocal pneumonia versus inflammation   Hospital Course:  68/F with chronic pain on methadone, chronic constipation Admitted with reported h/o constipation and Multifocal community acquired pneumonia   Interms of her constipation -resumed laxatives, stool softeners, Kub without ileus or excess stool burden advised to resume home laxative regimen -eating and tolerating diet at DC  Multifocal Pneumonia -community acquired, treated with IV rocephin, zithromax  -Urne legionella/pneumo Ag negative -weaned off O2 -changed to Po levaquin at DC  Prolong QTC -noted on admission EKG -repeat QTC this am 480 and hence normal    Discharge Exam: Filed Vitals:   04/13/14 0500  BP: 142/77  Pulse: 83  Temp: 98.3 F (36.8 C)  Resp: 17    General: AAOx3 Cardiovascular: S1S2/RRR Respiratory: CTAB  Discharge  Instructions You were cared for by a hospitalist during your hospital stay. If you have any questions about your discharge medications or the care you received while you were in the hospital after you are discharged, you can call the unit and asked to speak with the hospitalist on call if the hospitalist that took care of you is not available. Once you are discharged, your primary care physician will handle any further medical issues. Please note that NO REFILLS for any discharge medications will be authorized once you are discharged, as it is imperative that you return to your primary care physician (or establish a relationship with a primary care physician if you do not have one) for your aftercare needs so that they can reassess your need for medications and monitor your lab values.  Discharge Instructions   Diet general    Complete by:  As directed      Increase activity slowly    Complete by:  As directed           Current Discharge Medication List    START taking these medications   Details  levofloxacin (LEVAQUIN) 500 MG tablet Take 1 tablet (500 mg total) by mouth daily. 8 days Qty: 8 tablet, Refills: 0      CONTINUE these medications which have CHANGED   Details  polyethylene glycol (MIRALAX / GLYCOLAX) packet Take 17 g by mouth 2 (two) times daily as needed. Qty: 1 each, Refills: 0      CONTINUE these medications which have NOT CHANGED   Details  ALPRAZolam (XANAX) 1 MG tablet Take 1 mg by mouth 3 (three) times daily as needed. Also takes daily prn  imipramine (TOFRANIL) 50 MG tablet Take 1 tablet by mouth 2 (two) times daily.    lactulose (CHRONULAC) 10 GM/15ML solution Take 2 mLs by mouth 2 (two) times daily.    methadone (DOLOPHINE) 5 MG tablet Take 5 mg by mouth every 8 (eight) hours.     omeprazole (PRILOSEC) 20 MG capsule Take 1 capsule by mouth daily.    senna (SENOKOT) 8.6 MG tablet Take 2 tablets by mouth daily.    traZODone (DESYREL) 100 MG tablet Take 300  mg by mouth at bedtime.     zolpidem (AMBIEN) 10 MG tablet Take 10 mg by mouth at bedtime.       STOP taking these medications     divalproex (DEPAKOTE ER) 250 MG 24 hr tablet      docusate sodium (COLACE) 100 MG capsule      oxyCODONE (OXY IR/ROXICODONE) 5 MG immediate release tablet      topiramate (TOPAMAX) 25 MG tablet        Allergies  Allergen Reactions  . Ativan [Lorazepam]     Pt unsure why this is in there  . Sulfa Antibiotics Hives    unknown   Follow-up Information   Follow up with TRENT, MARGIE, PA-C. Schedule an appointment as soon as possible for a visit in 1 week.   Specialty:  Physician Assistant   Contact information:   [address] [place] [address] [phone]        The results of significant diagnostics from this hospitalization (including imaging, microbiology, ancillary and laboratory) are listed below for reference.    Significant Diagnostic Studies: Dg Chest 2 View  04/11/2014   CLINICAL DATA:  Shortness of breath.  EXAM: CHEST  2 VIEW  COMPARISON:  None available currently.  FINDINGS: The heart size and mediastinal contours are within normal limits. Both lungs are clear. No pneumothorax or pleural effusion is noted. The visualized skeletal structures are unremarkable.  IMPRESSION: No acute cardiopulmonary abnormality seen.   Electronically Signed   By: Roque Lias M.D.   On: 04/11/2014 18:12   Dg Abd 1 View  04/13/2014   CLINICAL DATA:  Assess for stool burden, abdominal pain  EXAM: ABDOMEN - 1 VIEW  COMPARISON:  04/11/2014  FINDINGS: Scattered large and small bowel gas is noted. No significant amount of fecal material is noted within the colon. Multiple ingested tablets are seen within the bowel. No bony abnormality is seen. No free air is noted.  IMPRESSION: No significant retained fecal material is noted.  These results will be called to the ordering clinician or representative by the Radiologist Assistant, and communication  documented in the PACS or zVision Dashboard.   Electronically Signed   By: Alcide Clever M.D.   On: 04/13/2014 12:53   Dg Abd 1 View  04/11/2014   CLINICAL DATA:  Constipation.  No bowel movement for 4 days.  EXAM: ABDOMEN - 1 VIEW  COMPARISON:  Radiographs 10/01/2013.  CT 03/05/2011.  FINDINGS: There is mild diffuse gaseous distention of the colon. No significant retained feces demonstrated. There are probable pill fragments within the pelvis, fewer than noted previously. At least one of these extends laterally beyond the margin of the bladder. No suspicious calcifications are seen. There are mild degenerative changes throughout the spine.  IMPRESSION: Gaseous distention of the colon without radiographic evidence of significant constipation or obstruction.   Electronically Signed   By: Roxy Horseman M.D.   On: 04/11/2014 18:10   Ct Angio  Chest Pe W/cm &/or Wo Cm  04/11/2014   CLINICAL DATA:  Shortness of breath, chest pain.  EXAM: CT ANGIOGRAPHY CHEST WITH CONTRAST  TECHNIQUE: Multidetector CT imaging of the chest was performed using the standard protocol during bolus administration of intravenous contrast. Multiplanar CT image reconstructions and MIPs were obtained to evaluate the vascular anatomy.  CONTRAST:  OMNIPAQUE IOHEXOL 350 MG/ML SOLN  COMPARISON:  CT scan of July 31, 2010.  FINDINGS: No pneumothorax or pleural effusion is noted. Mild multiple cul areas of airspace opacity are noted throughout both lungs suggesting inflammation or pneumonia. There is no evidence of pulmonary embolus. There is no evidence of thoracic aortic dissection or aneurysm. Mild coronary artery calcifications are noted. No mediastinal mass or adenopathy is noted. Visualized portion of upper abdomen appears normal. No significant osseous abnormality is noted.  Review of the MIP images confirms the above findings.  IMPRESSION: There is no evidence of pulmonary embolus. Mild multifocal areas of airspace opacity are noted  throughout both lungs suggesting multifocal pneumonia or inflammation.   Electronically Signed   By: Roque Lias M.D.   On: 04/11/2014 19:35    Microbiology: Recent Results (from the past 240 hour(s))  CULTURE, BLOOD (ROUTINE X 2)     Status: None   Collection Time    04/12/14  1:15 AM      Result Value Ref Range Status   Specimen Description BLOOD RIGHT ANTECUBITAL   Final   Special Requests BOTTLES DRAWN AEROBIC ONLY 5CC   Final   Culture  Setup Time     Final   Value: 04/12/2014 08:10     Performed at [facility]   Culture     Final   Value:        BLOOD CULTURE RECEIVED NO GROWTH TO DATE CULTURE WILL BE HELD FOR 5 DAYS BEFORE ISSUING A FINAL NEGATIVE REPORT     Performed at [facility]   Report Status PENDING   Incomplete  CULTURE, BLOOD (ROUTINE X 2)     Status: None   Collection Time    04/12/14  1:22 AM      Result Value Ref Range Status   Specimen Description BLOOD RIGHT HAND   Final   Special Requests BOTTLES DRAWN AEROBIC ONLY 6CC   Final   Culture  Setup Time     Final   Value: 04/12/2014 08:10     Performed at [facility]   Culture     Final   Value:        BLOOD CULTURE RECEIVED NO GROWTH TO DATE CULTURE WILL BE HELD FOR 5 DAYS BEFORE ISSUING A FINAL NEGATIVE REPORT     Performed at [facility]   Report Status PENDING   Incomplete     Labs: Basic Metabolic Panel:  Recent Labs Lab 04/11/14 1700 04/12/14 0122 04/13/14 1140  NA 133* 136* 137  K 2.2* 3.0* 3.5*  CL 85* 94* 102  CO2 35* 28 23  GLUCOSE 114* 82 87  BUN 11 7 6   CREATININE 0.70 0.62  0.61 0.58  CALCIUM 9.4 9.3 9.1   Liver Function Tests:  Recent Labs Lab 04/11/14 1700  AST 54*  ALT 18  ALKPHOS 110  BILITOT 0.3  PROT 7.3  ALBUMIN 3.0*   No results found for this basename: LIPASE, AMYLASE,  in the last 168 hours No results found for this basename: AMMONIA,  in the last 168 hours CBC:  Recent Labs Lab  04/11/14 1700 04/12/14 0122  WBC 6.9 6.3   NEUTROABS 5.4  --   HGB 10.7* 11.4*  HCT 31.9* 34.4*  MCV 84.6 85.4  PLT 218 260   Cardiac Enzymes:  Recent Labs Lab 04/11/14 1700  TROPONINI <0.30   BNP: BNP (last 3 results) No results found for this basename: PROBNP,  in the last 8760 hours CBG: No results found for this basename: GLUCAP,  in the last 168 hours     Signed:  Tamarick Kovalcik  [facility] 04/13/2014, 1:18 PM

## 2014-04-13 NOTE — Progress Notes (Signed)
04/13/2014 3:11 AM Progress Notes After the patient got up to the bathroom, I saw there were small yellow dots in the toilet that might have been part of a bowel movement.  The previous nurse informed me that she had clear stool before and it was hard to tell.  Will continue to monitor patient progress. I will suggest if we should hold the patient's laxative and stool softener medications. Harriet Masson

## 2014-04-15 DIAGNOSIS — J189 Pneumonia, unspecified organism: Secondary | ICD-10-CM | POA: Diagnosis not present

## 2014-04-15 DIAGNOSIS — E876 Hypokalemia: Secondary | ICD-10-CM | POA: Diagnosis not present

## 2014-04-15 DIAGNOSIS — R05 Cough: Secondary | ICD-10-CM | POA: Diagnosis not present

## 2014-04-15 DIAGNOSIS — R0602 Shortness of breath: Secondary | ICD-10-CM | POA: Diagnosis not present

## 2014-04-18 LAB — CULTURE, BLOOD (ROUTINE X 2)
CULTURE: NO GROWTH
Culture: NO GROWTH

## 2014-04-19 DIAGNOSIS — G43709 Chronic migraine without aura, not intractable, without status migrainosus: Secondary | ICD-10-CM | POA: Diagnosis not present

## 2014-04-22 DIAGNOSIS — M549 Dorsalgia, unspecified: Secondary | ICD-10-CM | POA: Diagnosis not present

## 2014-04-22 DIAGNOSIS — R002 Palpitations: Secondary | ICD-10-CM | POA: Diagnosis not present

## 2014-04-24 DIAGNOSIS — M4300 Spondylolysis, site unspecified: Secondary | ICD-10-CM | POA: Insufficient documentation

## 2014-04-29 DIAGNOSIS — R002 Palpitations: Secondary | ICD-10-CM | POA: Diagnosis not present

## 2014-04-29 DIAGNOSIS — R0602 Shortness of breath: Secondary | ICD-10-CM | POA: Diagnosis not present

## 2014-04-29 DIAGNOSIS — R05 Cough: Secondary | ICD-10-CM | POA: Diagnosis not present

## 2014-05-17 DIAGNOSIS — K5901 Slow transit constipation: Secondary | ICD-10-CM | POA: Diagnosis not present

## 2014-06-07 DIAGNOSIS — G43709 Chronic migraine without aura, not intractable, without status migrainosus: Secondary | ICD-10-CM | POA: Diagnosis not present

## 2014-08-10 DIAGNOSIS — M4856XA Collapsed vertebra, not elsewhere classified, lumbar region, initial encounter for fracture: Secondary | ICD-10-CM | POA: Diagnosis not present

## 2014-08-10 DIAGNOSIS — M545 Low back pain: Secondary | ICD-10-CM | POA: Diagnosis not present

## 2014-08-10 DIAGNOSIS — M549 Dorsalgia, unspecified: Secondary | ICD-10-CM | POA: Diagnosis not present

## 2014-08-10 DIAGNOSIS — M546 Pain in thoracic spine: Secondary | ICD-10-CM | POA: Diagnosis not present

## 2014-08-16 DIAGNOSIS — S32010A Wedge compression fracture of first lumbar vertebra, initial encounter for closed fracture: Secondary | ICD-10-CM | POA: Diagnosis not present

## 2014-08-21 DIAGNOSIS — M47816 Spondylosis without myelopathy or radiculopathy, lumbar region: Secondary | ICD-10-CM | POA: Diagnosis not present

## 2014-08-23 DIAGNOSIS — J3489 Other specified disorders of nose and nasal sinuses: Secondary | ICD-10-CM | POA: Diagnosis not present

## 2014-08-23 DIAGNOSIS — R040 Epistaxis: Secondary | ICD-10-CM | POA: Diagnosis not present

## 2014-08-24 DIAGNOSIS — S32010A Wedge compression fracture of first lumbar vertebra, initial encounter for closed fracture: Secondary | ICD-10-CM | POA: Diagnosis not present

## 2014-08-25 ENCOUNTER — Other Ambulatory Visit: Payer: Self-pay | Admitting: Orthopedic Surgery

## 2014-08-31 ENCOUNTER — Encounter (HOSPITAL_COMMUNITY)
Admission: RE | Admit: 2014-08-31 | Discharge: 2014-08-31 | Disposition: A | Discharge status: Home / Self Care | Payer: Medicare Other | Source: Ambulatory Visit | Attending: Orthopedic Surgery | Admitting: Orthopedic Surgery

## 2014-08-31 ENCOUNTER — Encounter (HOSPITAL_COMMUNITY): Payer: Self-pay

## 2014-08-31 DIAGNOSIS — F419 Anxiety disorder, unspecified: Secondary | ICD-10-CM | POA: Diagnosis not present

## 2014-08-31 DIAGNOSIS — K5900 Constipation, unspecified: Secondary | ICD-10-CM | POA: Diagnosis not present

## 2014-08-31 DIAGNOSIS — M4856XA Collapsed vertebra, not elsewhere classified, lumbar region, initial encounter for fracture: Principal | ICD-10-CM | POA: Diagnosis present

## 2014-08-31 DIAGNOSIS — Z01818 Encounter for other preprocedural examination: Secondary | ICD-10-CM | POA: Diagnosis not present

## 2014-08-31 DIAGNOSIS — Z882 Allergy status to sulfonamides status: Secondary | ICD-10-CM | POA: Diagnosis not present

## 2014-08-31 DIAGNOSIS — K219 Gastro-esophageal reflux disease without esophagitis: Secondary | ICD-10-CM | POA: Diagnosis present

## 2014-08-31 DIAGNOSIS — G8929 Other chronic pain: Secondary | ICD-10-CM | POA: Diagnosis present

## 2014-08-31 DIAGNOSIS — M5489 Other dorsalgia: Secondary | ICD-10-CM | POA: Diagnosis present

## 2014-08-31 DIAGNOSIS — Z01812 Encounter for preprocedural laboratory examination: Secondary | ICD-10-CM

## 2014-08-31 LAB — COMPREHENSIVE METABOLIC PANEL
ALBUMIN: 4.2 g/dL (ref 3.5–5.2)
ALT: 21 U/L (ref 0–35)
ANION GAP: 7 (ref 5–15)
AST: 35 U/L (ref 0–37)
Alkaline Phosphatase: 133 U/L — ABNORMAL HIGH (ref 39–117)
BILIRUBIN TOTAL: 0.7 mg/dL (ref 0.3–1.2)
BUN: <5 mg/dL — ABNORMAL LOW (ref 6–23)
CO2: 24 mmol/L (ref 19–32)
CREATININE: 0.77 mg/dL (ref 0.50–1.10)
Calcium: 9.8 mg/dL (ref 8.4–10.5)
Chloride: 109 mmol/L (ref 96–112)
GFR calc non Af Amer: 83 mL/min — ABNORMAL LOW (ref >90)
GLUCOSE: 85 mg/dL (ref 70–99)
Potassium: 3.7 mmol/L (ref 3.5–5.1)
Sodium: 140 mmol/L (ref 135–145)
Total Protein: 7.2 g/dL (ref 6.0–8.3)

## 2014-08-31 LAB — URINALYSIS, ROUTINE W REFLEX MICROSCOPIC
Bilirubin Urine: NEGATIVE
Glucose, UA: NEGATIVE mg/dL
HGB URINE DIPSTICK: NEGATIVE
KETONES UR: NEGATIVE mg/dL
Leukocytes, UA: NEGATIVE
Nitrite: NEGATIVE
PROTEIN: NEGATIVE mg/dL
Specific Gravity, Urine: 1.006 (ref 1.005–1.030)
UROBILINOGEN UA: 0.2 mg/dL (ref 0.0–1.0)
pH: 6.5 (ref 5.0–8.0)

## 2014-08-31 LAB — CBC WITH DIFFERENTIAL/PLATELET
BASOS PCT: 0 % (ref 0–1)
Basophils Absolute: 0 10*3/uL (ref 0.0–0.1)
EOS ABS: 0 10*3/uL (ref 0.0–0.7)
Eosinophils Relative: 0 % (ref 0–5)
HCT: 39.2 % (ref 36.0–46.0)
Hemoglobin: 12.7 g/dL (ref 12.0–15.0)
LYMPHS ABS: 2.6 10*3/uL (ref 0.7–4.0)
Lymphocytes Relative: 41 % (ref 12–46)
MCH: 27.6 pg (ref 26.0–34.0)
MCHC: 32.4 g/dL (ref 30.0–36.0)
MCV: 85.2 fL (ref 78.0–100.0)
MONO ABS: 0.4 10*3/uL (ref 0.1–1.0)
MONOS PCT: 6 % (ref 3–12)
Neutro Abs: 3.4 10*3/uL (ref 1.7–7.7)
Neutrophils Relative %: 53 % (ref 43–77)
Platelets: 175 10*3/uL (ref 150–400)
RBC: 4.6 MIL/uL (ref 3.87–5.11)
RDW: 15.5 % (ref 11.5–15.5)
WBC: 6.4 10*3/uL (ref 4.0–10.5)

## 2014-08-31 LAB — TYPE AND SCREEN
ABO/RH(D): A POS
Antibody Screen: NEGATIVE

## 2014-08-31 LAB — PROTIME-INR
INR: 1.06 (ref 0.00–1.49)
Prothrombin Time: 13.9 seconds (ref 11.6–15.2)

## 2014-08-31 LAB — SURGICAL PCR SCREEN
MRSA, PCR: NEGATIVE
Staphylococcus aureus: NEGATIVE

## 2014-08-31 LAB — APTT: APTT: 29 s (ref 24–37)

## 2014-08-31 LAB — ABO/RH: ABO/RH(D): A POS

## 2014-08-31 MED ORDER — CEFAZOLIN SODIUM-DEXTROSE 2-3 GM-% IV SOLR
2.0000 g | INTRAVENOUS | Status: AC
  Administered 2014-09-01: 2 g via INTRAVENOUS
  Filled 2014-08-31: qty 50

## 2014-08-31 NOTE — Pre-Procedure Instructions (Signed)
[name]  08/31/2014   Your procedure is scheduled on:   Wednesday  09/01/14   Report to [facility] Admitting at 1115 AM.  Call this number if you have problems the morning of surgery: [phone]   Remember:   Do not eat food or drink liquids after midnight.   Take these medicines the morning of surgery with A SIP OF WATER:   ALPRAZOLAM IF NEEDED, GABAPENTIN, METHADONE, OMEPRAZOLE    Do not wear jewelry, make-up or nail polish.  Do not wear lotions, powders, or perfumes. You may wear deodorant.  Do not shave 48 hours prior to surgery. Men may shave face and neck.  Do not bring valuables to the hospital.  [facility] is not responsible                  for any belongings or valuables.               Contacts, dentures or bridgework may not be worn into surgery.  Leave suitcase in the car. After surgery it may be brought to your room.  For patients admitted to the hospital, discharge time is determined by your                treatment team.               Patients discharged the day of surgery will not be allowed to drive  home.  Name and phone number of your driver:   Special Instructions: [place] - Preparing for Surgery  Before surgery, you can play an important role.  Because skin is not sterile, your skin needs to be as free of germs as possible.  You can reduce the number of germs on you skin by washing with CHG (chlorahexidine gluconate) soap before surgery.  CHG is an antiseptic cleaner which kills germs and bonds with the skin to continue killing germs even after washing.  Please DO NOT use if you have an allergy to CHG or antibacterial soaps.  If your skin becomes reddened/irritated stop using the CHG and inform your nurse when you arrive at Short Stay.  Do not shave (including legs and underarms) for at least 48 hours prior to the first CHG shower.  You may shave your face.  Please follow these instructions carefully:   1.  Shower with CHG Soap the night  before surgery and the                                morning of Surgery.  2.  If you choose to wash your hair, wash your hair first as usual with your       normal shampoo.  3.  After you shampoo, rinse your hair and body thoroughly to remove the                      Shampoo.  4.  Use CHG as you would any other liquid soap.  You can apply chg directly       to the skin and wash gently with scrungie or a clean washcloth.  5.  Apply the CHG Soap to your body ONLY FROM THE NECK DOWN.        Do not use on open wounds or open sores.  Avoid contact with your eyes,       ears, mouth and genitals (private parts).  Wash genitals (  private parts)       with your normal soap.  6.  Wash thoroughly, paying special attention to the area where your surgery        will be performed.  7.  Thoroughly rinse your body with warm water from the neck down.  8.  DO NOT shower/wash with your normal soap after using and rinsing off       the CHG Soap.  9.  Pat yourself dry with a clean towel.            10.  Wear clean pajamas.            11.  Place clean sheets on your bed the night of your first shower and do not        sleep with pets.  Day of Surgery  Do not apply any lotions/deoderants the morning of surgery.  Please wear clean clothes to the hospital/surgery center.     Please read over the following fact sheets that you were given: Pain Booklet, Coughing and Deep Breathing, Blood Transfusion Information, Total Joint Packet, MRSA Information and Surgical Site Infection Prevention

## 2014-09-01 ENCOUNTER — Inpatient Hospital Stay (HOSPITAL_COMMUNITY)
Admission: RE | Admit: 2014-09-01 | Discharge: 2014-09-02 | DRG: 517 | Disposition: A | Discharge status: Home / Self Care | Payer: Medicare Other | Source: Ambulatory Visit | Attending: Orthopedic Surgery | Admitting: Orthopedic Surgery

## 2014-09-01 ENCOUNTER — Inpatient Hospital Stay (HOSPITAL_COMMUNITY): Payer: Medicare Other | Admitting: Anesthesiology

## 2014-09-01 ENCOUNTER — Inpatient Hospital Stay (HOSPITAL_COMMUNITY): Payer: Medicare Other

## 2014-09-01 ENCOUNTER — Encounter (HOSPITAL_COMMUNITY)
Admission: RE | Disposition: A | Discharge status: Home / Self Care | Payer: Self-pay | Source: Ambulatory Visit | Attending: Orthopedic Surgery

## 2014-09-01 ENCOUNTER — Encounter (HOSPITAL_COMMUNITY): Payer: Self-pay | Admitting: Anesthesiology

## 2014-09-01 DIAGNOSIS — Z01818 Encounter for other preprocedural examination: Secondary | ICD-10-CM | POA: Diagnosis not present

## 2014-09-01 DIAGNOSIS — T148XXA Other injury of unspecified body region, initial encounter: Secondary | ICD-10-CM

## 2014-09-01 DIAGNOSIS — K219 Gastro-esophageal reflux disease without esophagitis: Secondary | ICD-10-CM | POA: Diagnosis not present

## 2014-09-01 DIAGNOSIS — Z01812 Encounter for preprocedural laboratory examination: Secondary | ICD-10-CM | POA: Diagnosis not present

## 2014-09-01 DIAGNOSIS — M5489 Other dorsalgia: Secondary | ICD-10-CM | POA: Diagnosis not present

## 2014-09-01 DIAGNOSIS — F419 Anxiety disorder, unspecified: Secondary | ICD-10-CM | POA: Diagnosis not present

## 2014-09-01 DIAGNOSIS — Z882 Allergy status to sulfonamides status: Secondary | ICD-10-CM | POA: Diagnosis not present

## 2014-09-01 DIAGNOSIS — W19XXXA Unspecified fall, initial encounter: Secondary | ICD-10-CM | POA: Diagnosis not present

## 2014-09-01 DIAGNOSIS — S32010A Wedge compression fracture of first lumbar vertebra, initial encounter for closed fracture: Secondary | ICD-10-CM | POA: Diagnosis not present

## 2014-09-01 DIAGNOSIS — IMO0002 Reserved for concepts with insufficient information to code with codable children: Secondary | ICD-10-CM

## 2014-09-01 DIAGNOSIS — M4856XA Collapsed vertebra, not elsewhere classified, lumbar region, initial encounter for fracture: Secondary | ICD-10-CM | POA: Diagnosis not present

## 2014-09-01 DIAGNOSIS — K5900 Constipation, unspecified: Secondary | ICD-10-CM | POA: Diagnosis not present

## 2014-09-01 DIAGNOSIS — G8929 Other chronic pain: Secondary | ICD-10-CM | POA: Diagnosis not present

## 2014-09-01 DIAGNOSIS — Z9889 Other specified postprocedural states: Secondary | ICD-10-CM | POA: Diagnosis not present

## 2014-09-01 HISTORY — PX: KYPHOPLASTY: SHX5884

## 2014-09-01 SURGERY — KYPHOPLASTY
Anesthesia: General | Site: Back

## 2014-09-01 MED ORDER — DOCUSATE SODIUM 100 MG PO CAPS: 100.0000 mg | ORAL_CAPSULE | Freq: Two times a day (BID) | ORAL | Status: DC

## 2014-09-01 MED ORDER — ZOLPIDEM TARTRATE 5 MG PO TABS
5.0000 mg | ORAL_TABLET | Freq: Every day | ORAL | Status: DC
  Administered 2014-09-01: 5 mg via ORAL
  Filled 2014-09-01: qty 1

## 2014-09-01 MED ORDER — ONDANSETRON HCL 4 MG/2ML IJ SOLN
INTRAMUSCULAR | Status: DC | PRN
  Administered 2014-09-01: 4 mg via INTRAVENOUS

## 2014-09-01 MED ORDER — LIDOCAINE HCL (CARDIAC) 20 MG/ML IV SOLN
INTRAVENOUS | Status: DC | PRN
  Administered 2014-09-01: 100 mg via INTRAVENOUS

## 2014-09-01 MED ORDER — PROPOFOL 10 MG/ML IV BOLUS
INTRAVENOUS | Status: DC | PRN
  Administered 2014-09-01: 120 mg via INTRAVENOUS

## 2014-09-01 MED ORDER — SENNOSIDES-DOCUSATE SODIUM 8.6-50 MG PO TABS
4.0000 | ORAL_TABLET | Freq: Every day | ORAL | Status: DC
  Administered 2014-09-01: 4 via ORAL
  Filled 2014-09-01 (×2): qty 4

## 2014-09-01 MED ORDER — HYDROCODONE-ACETAMINOPHEN 5-325 MG PO TABS
1.0000 | ORAL_TABLET | Freq: Four times a day (QID) | ORAL | Status: DC | PRN
  Administered 2014-09-02 (×2): 2 via ORAL
  Filled 2014-09-01 (×2): qty 2

## 2014-09-01 MED ORDER — MIDAZOLAM HCL 2 MG/2ML IJ SOLN
INTRAMUSCULAR | Status: AC
  Filled 2014-09-01: qty 2

## 2014-09-01 MED ORDER — SODIUM CHLORIDE 0.9 % IJ SOLN: 3.0000 mL | INTRAMUSCULAR | Status: DC | PRN

## 2014-09-01 MED ORDER — MEPERIDINE HCL 25 MG/ML IJ SOLN
INTRAMUSCULAR | Status: AC
  Filled 2014-09-01: qty 1

## 2014-09-01 MED ORDER — FENTANYL CITRATE 0.05 MG/ML IJ SOLN
INTRAMUSCULAR | Status: DC | PRN
  Administered 2014-09-01: 150 ug via INTRAVENOUS

## 2014-09-01 MED ORDER — MIDAZOLAM HCL 5 MG/5ML IJ SOLN
INTRAMUSCULAR | Status: DC | PRN
  Administered 2014-09-01: 2 mg via INTRAVENOUS

## 2014-09-01 MED ORDER — ROCURONIUM BROMIDE 100 MG/10ML IV SOLN
INTRAVENOUS | Status: DC | PRN
  Administered 2014-09-01: 30 mg via INTRAVENOUS

## 2014-09-01 MED ORDER — PHENYLEPHRINE HCL 10 MG/ML IJ SOLN
INTRAMUSCULAR | Status: DC | PRN
  Administered 2014-09-01: 120 ug via INTRAVENOUS
  Administered 2014-09-01: 80 ug via INTRAVENOUS

## 2014-09-01 MED ORDER — BISACODYL 5 MG PO TBEC: 5.0000 mg | DELAYED_RELEASE_TABLET | Freq: Every day | ORAL | Status: DC | PRN

## 2014-09-01 MED ORDER — ARTIFICIAL TEARS OP OINT
TOPICAL_OINTMENT | OPHTHALMIC | Status: DC | PRN
  Administered 2014-09-01: 1 via OPHTHALMIC

## 2014-09-01 MED ORDER — POLYETHYLENE GLYCOL 3350 17 G PO PACK
17.0000 g | PACK | Freq: Every day | ORAL | Status: DC
  Filled 2014-09-01 (×2): qty 1

## 2014-09-01 MED ORDER — ACETAMINOPHEN 650 MG RE SUPP: 650.0000 mg | RECTAL | Status: DC | PRN

## 2014-09-01 MED ORDER — IOHEXOL 300 MG/ML  SOLN
INTRAMUSCULAR | Status: DC | PRN
  Administered 2014-09-01: 50 mL

## 2014-09-01 MED ORDER — LIDOCAINE HCL (CARDIAC) 20 MG/ML IV SOLN
INTRAVENOUS | Status: AC
  Filled 2014-09-01: qty 5

## 2014-09-01 MED ORDER — FENTANYL CITRATE 0.05 MG/ML IJ SOLN
25.0000 ug | INTRAMUSCULAR | Status: DC | PRN
  Administered 2014-09-01 (×2): 25 ug via INTRAVENOUS

## 2014-09-01 MED ORDER — LACTATED RINGERS IV SOLN
INTRAVENOUS | Status: DC
  Administered 2014-09-01: 50 mL/h via INTRAVENOUS

## 2014-09-01 MED ORDER — SODIUM CHLORIDE 0.9 % IJ SOLN
3.0000 mL | Freq: Two times a day (BID) | INTRAMUSCULAR | Status: DC
  Administered 2014-09-01: 3 mL via INTRAVENOUS

## 2014-09-01 MED ORDER — BACITRACIN 500 UNIT/GM EX OINT
TOPICAL_OINTMENT | CUTANEOUS | Status: DC | PRN
  Administered 2014-09-01: 1 via TOPICAL

## 2014-09-01 MED ORDER — MORPHINE SULFATE 2 MG/ML IJ SOLN
1.0000 mg | INTRAMUSCULAR | Status: DC | PRN
  Administered 2014-09-01 (×2): 2 mg via INTRAVENOUS
  Filled 2014-09-01 (×2): qty 1

## 2014-09-01 MED ORDER — BUPIVACAINE-EPINEPHRINE (PF) 0.25% -1:200000 IJ SOLN
INTRAMUSCULAR | Status: AC
  Filled 2014-09-01: qty 30

## 2014-09-01 MED ORDER — BACITRACIN ZINC 500 UNIT/GM EX OINT
TOPICAL_OINTMENT | CUTANEOUS | Status: AC
  Filled 2014-09-01: qty 28.35

## 2014-09-01 MED ORDER — 0.9 % SODIUM CHLORIDE (POUR BTL) OPTIME
TOPICAL | Status: DC | PRN
  Administered 2014-09-01: 1000 mL

## 2014-09-01 MED ORDER — ONDANSETRON HCL 4 MG/2ML IJ SOLN: 4.0000 mg | INTRAMUSCULAR | Status: DC | PRN

## 2014-09-01 MED ORDER — FLEET ENEMA 7-19 GM/118ML RE ENEM
1.0000 | ENEMA | Freq: Once | RECTAL | Status: AC | PRN
  Filled 2014-09-01: qty 1

## 2014-09-01 MED ORDER — CEFAZOLIN SODIUM 1-5 GM-% IV SOLN
1.0000 g | Freq: Three times a day (TID) | INTRAVENOUS | Status: AC
  Administered 2014-09-01 – 2014-09-02 (×2): 1 g via INTRAVENOUS
  Filled 2014-09-01 (×3): qty 50

## 2014-09-01 MED ORDER — FENTANYL CITRATE 0.05 MG/ML IJ SOLN
INTRAMUSCULAR | Status: AC
  Filled 2014-09-01: qty 5

## 2014-09-01 MED ORDER — ACETAMINOPHEN 325 MG PO TABS: 650.0000 mg | ORAL_TABLET | ORAL | Status: DC | PRN

## 2014-09-01 MED ORDER — TRAZODONE HCL 150 MG PO TABS
300.0000 mg | ORAL_TABLET | Freq: Every day | ORAL | Status: DC
  Administered 2014-09-01: 300 mg via ORAL
  Filled 2014-09-01 (×2): qty 2

## 2014-09-01 MED ORDER — ROCURONIUM BROMIDE 50 MG/5ML IV SOLN
INTRAVENOUS | Status: AC
  Filled 2014-09-01: qty 1

## 2014-09-01 MED ORDER — DEXAMETHASONE SODIUM PHOSPHATE 10 MG/ML IJ SOLN
INTRAMUSCULAR | Status: AC
  Filled 2014-09-01: qty 1

## 2014-09-01 MED ORDER — LACTULOSE 10 GM/15ML PO SOLN
10.0000 g | Freq: Two times a day (BID) | ORAL | Status: DC
Start: 2014-09-01 — End: 2014-09-02
  Administered 2014-09-01: 10 g via ORAL
  Filled 2014-09-01 (×3): qty 15

## 2014-09-01 MED ORDER — DEXAMETHASONE SODIUM PHOSPHATE 10 MG/ML IJ SOLN
INTRAMUSCULAR | Status: DC | PRN
  Administered 2014-09-01: 10 mg via INTRAVENOUS

## 2014-09-01 MED ORDER — PROMETHAZINE HCL 25 MG/ML IJ SOLN: 6.2500 mg | INTRAMUSCULAR | Status: DC | PRN

## 2014-09-01 MED ORDER — DIAZEPAM 5 MG PO TABS
5.0000 mg | ORAL_TABLET | Freq: Four times a day (QID) | ORAL | Status: DC | PRN
  Administered 2014-09-02: 5 mg via ORAL
  Filled 2014-09-01: qty 1

## 2014-09-01 MED ORDER — BUPIVACAINE-EPINEPHRINE 0.25% -1:200000 IJ SOLN
INTRAMUSCULAR | Status: DC | PRN
  Administered 2014-09-01: 30 mL

## 2014-09-01 MED ORDER — METHADONE HCL 10 MG PO TABS
5.0000 mg | ORAL_TABLET | Freq: Every day | ORAL | Status: DC
  Administered 2014-09-01: 5 mg via ORAL
  Filled 2014-09-01: qty 1

## 2014-09-01 MED ORDER — PHENOL 1.4 % MT LIQD
1.0000 | OROMUCOSAL | Status: DC | PRN
Start: 2014-09-01 — End: 2014-09-02

## 2014-09-01 MED ORDER — ARTIFICIAL TEARS OP OINT
TOPICAL_OINTMENT | OPHTHALMIC | Status: AC
  Filled 2014-09-01: qty 3.5

## 2014-09-01 MED ORDER — NEOSTIGMINE METHYLSULFATE 10 MG/10ML IV SOLN
INTRAVENOUS | Status: DC | PRN
  Administered 2014-09-01: 3 mg via INTRAVENOUS

## 2014-09-01 MED ORDER — MEPERIDINE HCL 25 MG/ML IJ SOLN
6.2500 mg | INTRAMUSCULAR | Status: DC | PRN
  Administered 2014-09-01: 12.5 mg via INTRAVENOUS

## 2014-09-01 MED ORDER — ALPRAZOLAM 0.5 MG PO TABS: 1.0000 mg | ORAL_TABLET | Freq: Three times a day (TID) | ORAL | Status: DC | PRN

## 2014-09-01 MED ORDER — ZOLPIDEM TARTRATE 5 MG PO TABS: 5.0000 mg | ORAL_TABLET | Freq: Every evening | ORAL | Status: DC | PRN

## 2014-09-01 MED ORDER — PROPOFOL 10 MG/ML IV BOLUS
INTRAVENOUS | Status: AC
  Filled 2014-09-01: qty 20

## 2014-09-01 MED ORDER — FENTANYL CITRATE 0.05 MG/ML IJ SOLN
INTRAMUSCULAR | Status: AC
  Filled 2014-09-01: qty 2

## 2014-09-01 MED ORDER — SENNOSIDES-DOCUSATE SODIUM 8.6-50 MG PO TABS: 1.0000 | ORAL_TABLET | Freq: Every evening | ORAL | Status: DC | PRN

## 2014-09-01 MED ORDER — ONDANSETRON HCL 4 MG/2ML IJ SOLN
INTRAMUSCULAR | Status: AC
  Filled 2014-09-01: qty 2

## 2014-09-01 MED ORDER — LACTATED RINGERS IV SOLN
INTRAVENOUS | Status: DC | PRN
  Administered 2014-09-01: 13:00:00 via INTRAVENOUS

## 2014-09-01 MED ORDER — PANTOPRAZOLE SODIUM 40 MG PO TBEC
40.0000 mg | DELAYED_RELEASE_TABLET | Freq: Every day | ORAL | Status: DC
  Filled 2014-09-01: qty 1

## 2014-09-01 MED ORDER — GLYCOPYRROLATE 0.2 MG/ML IJ SOLN
INTRAMUSCULAR | Status: DC | PRN
  Administered 2014-09-01: .4 mg via INTRAVENOUS

## 2014-09-01 MED ORDER — MENTHOL 3 MG MT LOZG: 1.0000 | LOZENGE | OROMUCOSAL | Status: DC | PRN

## 2014-09-01 MED ORDER — GABAPENTIN 300 MG PO CAPS
300.0000 mg | ORAL_CAPSULE | Freq: Two times a day (BID) | ORAL | Status: DC
  Administered 2014-09-01: 300 mg via ORAL
  Filled 2014-09-01 (×3): qty 1

## 2014-09-01 MED ORDER — ALUM & MAG HYDROXIDE-SIMETH 200-200-20 MG/5ML PO SUSP: 30.0000 mL | Freq: Four times a day (QID) | ORAL | Status: DC | PRN

## 2014-09-01 SURGICAL SUPPLY — 49 items
ADH SKN CLS APL DERMABOND .7 (GAUZE/BANDAGES/DRESSINGS)
BANDAGE ADH SHEER 1  50/CT (GAUZE/BANDAGES/DRESSINGS) ×4 IMPLANT
BLADE SURG 15 STRL LF DISP TIS (BLADE) ×1 IMPLANT
BLADE SURG 15 STRL SS (BLADE) ×2
CEMENT BONE KYPHX HV R (Orthopedic Implant) ×1 IMPLANT
CEMENT KYPHON C01A KIT/MIXER (Cement) ×1 IMPLANT
COVER MAYO STAND STRL (DRAPES) ×2 IMPLANT
COVER SURGICAL LIGHT HANDLE (MISCELLANEOUS) ×2 IMPLANT
CURETTE WEDGE 8.5MM KYPHX (MISCELLANEOUS) ×1 IMPLANT
DERMABOND ADVANCED (GAUZE/BANDAGES/DRESSINGS)
DERMABOND ADVANCED .7 DNX12 (GAUZE/BANDAGES/DRESSINGS) IMPLANT
DRAPE C-ARM 42X72 X-RAY (DRAPES) ×2 IMPLANT
DRAPE INCISE IOBAN 66X45 STRL (DRAPES) ×2 IMPLANT
DRAPE LAPAROTOMY T 102X78X121 (DRAPES) ×2 IMPLANT
DRAPE SURG 17X23 STRL (DRAPES) ×8 IMPLANT
DURAPREP 26ML APPLICATOR (WOUND CARE) ×2 IMPLANT
GAUZE SPONGE 4X4 16PLY XRAY LF (GAUZE/BANDAGES/DRESSINGS) ×2 IMPLANT
GLOVE BIO SURGEON STRL SZ7 (GLOVE) ×2 IMPLANT
GLOVE BIO SURGEON STRL SZ8 (GLOVE) ×2 IMPLANT
GLOVE BIOGEL M STRL SZ7.5 (GLOVE) ×1 IMPLANT
GLOVE BIOGEL PI IND STRL 7.0 (GLOVE) ×1 IMPLANT
GLOVE BIOGEL PI IND STRL 8 (GLOVE) ×1 IMPLANT
GLOVE BIOGEL PI INDICATOR 7.0 (GLOVE) ×1
GLOVE BIOGEL PI INDICATOR 8 (GLOVE) ×1
GLOVE SURG SS PI 7.0 STRL IVOR (GLOVE) ×1 IMPLANT
GOWN STRL REUS W/ TWL LRG LVL3 (GOWN DISPOSABLE) ×2 IMPLANT
GOWN STRL REUS W/ TWL XL LVL3 (GOWN DISPOSABLE) ×1 IMPLANT
GOWN STRL REUS W/TWL LRG LVL3 (GOWN DISPOSABLE) ×4
GOWN STRL REUS W/TWL XL LVL3 (GOWN DISPOSABLE) ×2
KIT BASIN OR (CUSTOM PROCEDURE TRAY) ×2 IMPLANT
KIT ROOM TURNOVER OR (KITS) ×2 IMPLANT
NDL HYPO 25X1 1.5 SAFETY (NEEDLE) IMPLANT
NDL SPNL 18GX3.5 QUINCKE PK (NEEDLE) ×2 IMPLANT
NEEDLE 22X1 1/2 (OR ONLY) (NEEDLE) IMPLANT
NEEDLE HYPO 25X1 1.5 SAFETY (NEEDLE) ×2 IMPLANT
NEEDLE SPNL 18GX3.5 QUINCKE PK (NEEDLE) ×4 IMPLANT
NS IRRIG 1000ML POUR BTL (IV SOLUTION) ×2 IMPLANT
PACK SURGICAL SETUP 50X90 (CUSTOM PROCEDURE TRAY) ×2 IMPLANT
PAD ARMBOARD 7.5X6 YLW CONV (MISCELLANEOUS) ×6 IMPLANT
POSITIONER HEAD PRONE TRACH (MISCELLANEOUS) ×2 IMPLANT
SPONGE GAUZE 4X4 12PLY STER LF (GAUZE/BANDAGES/DRESSINGS) ×1 IMPLANT
SUT MNCRL AB 4-0 PS2 18 (SUTURE) ×2 IMPLANT
SYR BULB IRRIGATION 50ML (SYRINGE) ×2 IMPLANT
SYR CONTROL 10ML LL (SYRINGE) ×2 IMPLANT
TAPE CLOTH SURG 4X10 WHT LF (GAUZE/BANDAGES/DRESSINGS) ×1 IMPLANT
TOWEL OR 17X24 6PK STRL BLUE (TOWEL DISPOSABLE) ×2 IMPLANT
TOWEL OR 17X26 10 PK STRL BLUE (TOWEL DISPOSABLE) ×2 IMPLANT
TRAY KYPHOPAK 15/3 ONESTEP 1ST (MISCELLANEOUS) IMPLANT
TRAY KYPHOPAK 20/3 ONESTEP 1ST (MISCELLANEOUS) ×1 IMPLANT

## 2014-09-01 NOTE — Anesthesia Procedure Notes (Signed)
Procedure Name: Intubation Date/Time: 09/01/2014 1:13 PM Performed by: Marena Chancy Pre-anesthesia Checklist: Patient identified, Timeout performed, Emergency Drugs available, Suction available and Patient being monitored Patient Re-evaluated:Patient Re-evaluated prior to inductionOxygen Delivery Method: Circle system utilized Preoxygenation: Pre-oxygenation with 100% oxygen Intubation Type: IV induction Ventilation: Mask ventilation without difficulty Laryngoscope Size: Glidescope Tube type: Oral Tube size: 6.5 mm Number of attempts: 3 Airway Equipment and Method: Video-laryngoscopy Secured at: 20 cm Tube secured with: Tape Dental Injury: Teeth and Oropharynx as per pre-operative assessment  Difficulty Due To: Difficulty was anticipated, Difficult Airway- due to anterior larynx, Difficult Airway- due to limited oral opening and Difficult Airway- due to dentition Future Recommendations: Recommend- induction with short-acting agent, and alternative techniques readily available

## 2014-09-01 NOTE — Anesthesia Preprocedure Evaluation (Addendum)
Anesthesia Evaluation  Patient identified by MRN, date of birth, ID band Patient awake    Reviewed: Allergy & Precautions, NPO status , Patient's Chart, lab work & pertinent test results, reviewed documented beta blocker date and time   History of Anesthesia Complications Negative for: history of anesthetic complications  Airway Mallampati: III  TM Distance: >3 FB Neck ROM: Full    Dental  (+) Teeth Intact, Dental Advisory Given   Pulmonary neg pulmonary ROS,  breath sounds clear to auscultation        Cardiovascular negative cardio ROS  Rhythm:Regular  EKG OK 03/2014,    Neuro/Psych  Headaches, PSYCHIATRIC DISORDERS Anxiety    GI/Hepatic Neg liver ROS, GERD-  Medicated and Controlled,  Endo/Other  negative endocrine ROS  Renal/GU negative Renal ROS     Musculoskeletal negative musculoskeletal ROS (+)   Abdominal (+)  Abdomen: soft.    Peds  Hematology 12/39   Anesthesia Other Findings   Reproductive/Obstetrics negative OB ROS                           Anesthesia Physical Anesthesia Plan  ASA: II  Anesthesia Plan: General   Post-op Pain Management:    Induction: Intravenous  Airway Management Planned: Oral ETT  Additional Equipment:   Intra-op Plan:   Post-operative Plan: Extubation in OR  Informed Consent: I have reviewed the patients History and Physical, chart, labs and discussed the procedure including the risks, benefits and alternatives for the proposed anesthesia with the patient or authorized representative who has indicated his/her understanding and acceptance.     Plan Discussed with:   Anesthesia Plan Comments:         Anesthesia Quick Evaluation

## 2014-09-01 NOTE — Transfer of Care (Signed)
Immediate Anesthesia Transfer of Care Note  Patient: [name]  Procedure(s) Performed: Procedure(s) with comments: KYPHOPLASTY (N/A) - Lumbar 1 kyphoplasty  Patient Location: PACU  Anesthesia Type:General  Level of Consciousness: awake, alert , oriented and patient cooperative  Airway & Oxygen Therapy: Patient Spontanous Breathing and Patient connected to nasal cannula oxygen  Post-op Assessment: Report given to RN, Post -op Vital signs reviewed and stable and Patient moving all extremities X 4  Post vital signs: Reviewed and stable  Last Vitals:  Filed Vitals:   09/01/14 1155  BP: 143/87  Pulse: 81  Temp: 36.3 C  Resp: 18    Complications: No apparent anesthesia complications

## 2014-09-01 NOTE — H&P (Signed)
PREOPERATIVE H&P  Chief Complaint: back pain  HPI: [name] is a 69 y.o. female who presents with ongoing pain in the mid-back  MRI and radiographs reveal compression fracture L1  Patient has failed multiple forms of conservative care and continues to have pain (see office notes for additional details regarding the patient's full course of treatment)  Past Medical History  Diagnosis Date  . GERD (gastroesophageal reflux disease)   . Anxiety   . Chronic pain   . Constipation   . Migraine    Past Surgical History  Procedure Laterality Date  . Appendectomy    . Bladder surgery      Mesh implanted  . Shoulder surgery      X 2  . Tonsillectomy    . Abdominal wall mesh  removal    . Cataract extraction w/ intraocular lens  implant, bilateral     History   Social History  . Marital Status: Married    Spouse Name: N/A  . Number of Children: 1  . Years of Education: N/A   Occupational History  . Retired    Social History Main Topics  . Smoking status: Never Smoker   . Smokeless tobacco: Not on file  . Alcohol Use: No  . Drug Use: No  . Sexual Activity: Yes    Birth Control/ Protection: Post-menopausal   Other Topics Concern  . Not on file   Social History Narrative   Family History  Problem Relation Age of Onset  . Coronary artery disease Father 22  . Coronary artery disease Mother 41   Allergies  Allergen Reactions  . Sulfa Antibiotics Hives    unknown   Prior to Admission medications   Medication Sig Start Date End Date Taking? Authorizing Provider  ALPRAZolam Prudy Feeler) 1 MG tablet Take 1 mg by mouth 3 (three) times daily as needed for anxiety. Also takes daily prn   Yes Historical Provider, MD  gabapentin (NEURONTIN) 300 MG capsule Take 300 mg by mouth 3 (three) times daily as needed. For pain 08/09/14  Yes Historical Provider, MD  lactulose (CHRONULAC) 10 GM/15ML solution Take 10 g by mouth 2 (two) times daily.  12/16/13  Yes Historical  Provider, MD  methadone (DOLOPHINE) 5 MG tablet Take 5 mg by mouth at bedtime.    Yes Historical Provider, MD  omeprazole (PRILOSEC) 20 MG capsule Take 20 mg by mouth daily.    Yes Historical Provider, MD  polyethylene glycol (MIRALAX / GLYCOLAX) packet Take 17 g by mouth 2 (two) times daily as needed. Patient taking differently: Take 17 g by mouth daily.  04/13/14  Yes Zannie Cove, MD  sennosides-docusate sodium (SENOKOT-S) 8.6-50 MG tablet Take 4 tablets by mouth at bedtime.   Yes Historical Provider, MD  traZODone (DESYREL) 100 MG tablet Take 300 mg by mouth at bedtime.  01/31/13  Yes Historical Provider, MD  zolpidem (AMBIEN) 10 MG tablet Take 10 mg by mouth at bedtime.    Yes Historical Provider, MD  levofloxacin (LEVAQUIN) 500 MG tablet Take 1 tablet (500 mg total) by mouth daily. 8 days Patient not taking: Reported on 08/26/2014 04/13/14   Zannie Cove, MD     All other systems have been reviewed and were otherwise negative with the exception of those mentioned in the HPI and as above.  Physical Exam: There were no vitals filed for this visit.  General: Alert, no acute distress Cardiovascular: No pedal edema Respiratory: No cyanosis, no use of accessory  musculature Skin: No lesions in the area of chief complaint Neurologic: Sensation intact distally Psychiatric: Patient is competent for consent with normal mood and affect Lymphatic: No axillary or cervical lymphadenopathy  MUSCULOSKELETAL: + TTP mid-back  Assessment/Plan: L 1 compression fracture Plan for Procedure(s): KYPHOPLASTY L1   Emilee Hero, MD 09/01/2014 7:17 AM

## 2014-09-01 NOTE — Anesthesia Postprocedure Evaluation (Signed)
  Anesthesia Post-op Note  Patient: [name]  Procedure(s) Performed: Procedure(s) with comments: KYPHOPLASTY (N/A) - Lumbar 1 kyphoplasty  Patient Location: PACU  Anesthesia Type:General  Level of Consciousness: awake  Airway and Oxygen Therapy: Patient Spontanous Breathing and Patient connected to nasal cannula oxygen  Post-op Pain: none  Post-op Assessment: Post-op Vital signs reviewed and Patient's Cardiovascular Status Stable  Post-op Vital Signs: Reviewed and stable  Last Vitals:  Filed Vitals:   09/01/14 1455  BP:   Pulse:   Temp: 36.4 C  Resp:     Complications: No apparent anesthesia complications

## 2014-09-01 NOTE — Plan of Care (Signed)
Problem: Consults Goal: Diagnosis - Spinal Surgery Outcome: Completed/Met Date Met:  09/01/14 L1 KYPHOPLASTY

## 2014-09-02 ENCOUNTER — Encounter (HOSPITAL_COMMUNITY): Payer: Self-pay | Admitting: Orthopedic Surgery

## 2014-09-02 NOTE — Progress Notes (Signed)
Pt given D/C instructions with Rx's, verbal understanding was provided. Pt's incision was clean and dry with no sign of infection. Pt's IV was removed prior to D/C. Pt D/C'd home via wheelchair @ 1500 per MD order. Pt is stable @ D/C and has no other needs at this time. Rema Fendt, RN

## 2014-09-02 NOTE — Discharge Instructions (Signed)
Balloon Kyphoplasty Balloon kyphoplasty is a procedure in which orthopedic balloons are used to gently raise a collapsed vertebral body in an attempt to alleviate pain. This condition is also called a vertebral compression fracture, or VCF. Most often the cause of this collapse is due to osteoporosis of the vertebral body, which makes the bone susceptible to minor trauma. Osteoporosis is a condition that comes on with aging. Osteoporosis is due to a loss of mineral from the bone. This causes a softening of the bones. The diagnosis of these fractures is usually made with X-rays or magnetic resonance imaging (MRI). Some advantages of this procedure are:  Pain relief.  Restoration of vertebral body height.  Correction of spinal deformity.  Relatively low complication rate. The procedure is usually done by orthopedic surgeons, neurosurgeons, interventional radiologists, and interventional neuroradiologists who specialize in treating the spine with balloon kyphoplasty.  This procedure is not useful for:  Patients with young, healthy bones or those who have sustained a vertebral body fracture or collapse in a major accident.  Patients with spinal curvature such as scoliosis or kyphosis that is due to causes other than osteoporosis.  Patients who suffer from spinal stenosis or herniated discs with nerve or spinal cord compression and loss of neurological function not associated with a vertebral compression fracture.  Patients with known metastatic disease of the spine. THE BENEFITS OF BALLOON KYPHOPLASTY INCLUDE:  Reduction in back pain. If there is pain due to the procedure, it will typically lessen within two weeks.  Improved quality of life.  Improved mobility (you can get around better).  Improved ability to perform activities of daily living. PROCEDURE  The kyphoplasty procedure involves the use of a balloon to restore the vertebral body height and shape. This is followed by placement of  bone cement to strengthen it. The procedure may be done under intravenous sedation, local anesthetic, or general anesthetic. The patient lies face-down on the operating room table. X-ray machines are used to show the collapsed bones.  The surgeon makes two small incisions. A tube is then inserted into the center of the vertebral body. Through this tube, balloons are placed in the vertebral body. Then the balloons are inflated. This creates a cavity. This pushes the bone back toward its normal height and shape.  Once the cavity is created, the surgeon removes the inflatable balloon. The cement is mixed and used to fill the cavity in a slow and controlled fashion. The cement hardens. Then the surgeon takes out the tubes. The incisions are closed with a single stitch. Patients usually go home the same day. Patients can go back to all normal activities of daily living as soon as possible. There are no restrictions.  RISKS AND COMPLICATIONS As with any surgery, there are potential risks. Although the procedure is designed to minimize risks, complications may occur. Be sure to discuss the risks with your caregiver. Balloon kyphoplasty is not right for all patients. Complications may require more treatments. Complications that can occur include:  The usual risks of local or general anesthetics apply. These risks depend on the patient's overall health.  Heart attack (myocardial infarction).  Stroke (cerebrovascular accident).  A blockage in the lung (pulmonary embolism - there is a very small chance of the cement traveling to lungs).  There is a small risk of the bone cement leaking from within the boundaries of the vertebral body. In most cases, this rare event does not cause any problems. However, if the cement does leak it may  cause:  Pain.  Altered sensation.  Very rarely, paralysis.  Should the cement leak further, more significant surgery may be needed to stop the irritation of the nerves or  spinal cord.  In very rare circumstances, the cement may irritate or damage the spinal cord or nerves.  Heart stops beating (cardiac arrest).  Excessive bleeding (hemorrhage).  Infection (there is a small chance of the cement block becoming infected at the time of surgery or even years later). Document Released: 06/07/2004 Document Revised: 11/16/2013 Document Reviewed: 01/11/2009 [facility] Patient Information 2015 [place], [place]. This information is not intended to replace advice given to you by your health care provider. Make sure you discuss any questions you have with your health care provider.

## 2014-09-02 NOTE — Progress Notes (Signed)
    Patient doing well Improved LBP. Patient states this pain is "different" and does not feel like fracture pain Has only voided very little on her own thus far. Had to be cathed last night.  Denies leg pain    Physical Exam: Filed Vitals:   09/02/14 0426  BP: 125/70  Pulse: 87  Temp: 98 F (36.7 C)  Resp: 16    Dressing in place NVI  POD #1 s/p L1 kyphoplasty, doing well  - encourage ambulation and PO intake - Percocet for pain, Valium for muscle spasms - likely d/c home today, but would like to ensure patient spontaneous voids first

## 2014-09-02 NOTE — Op Note (Signed)
[name], [name]                ACCOUNT NO.:  [id number]  MEDICAL RECORD NO.:  [id number]  LOCATION:  3C05C                        FACILITY:  [facility]  PHYSICIAN:  Estill Bamberg, MD      DATE OF BIRTH:  [birth year]  DATE OF PROCEDURE:  09/01/2014                              OPERATIVE REPORT   PREOPERATIVE DIAGNOSIS:  L1 compression fracture.  POSTOPERATIVE DIAGNOSIS:  L1 compression fracture.  PROCEDURE:  L1 kyphoplasty.  SURGEON:  Estill Bamberg, MD  ASSISTANT:  Jason Coop, PA-C.  ANESTHESIA:  General endotracheal anesthesia.  COMPLICATIONS:  None.  DISPOSITION:  Stable.  ESTIMATED BLOOD LOSS:  Minimal.  INDICATIONS FOR SURGERY:  Briefly, Ms. Dancy is a very pleasant 69 year old female who did present to me on August 24, 2014, with pain in her low back.  She did have a fall in October and then subsequently had an additional fall.  She has been having ongoing pain in the mid back since the fall.  Radiographs and MRI did reveal an acute/subacute L1 compression fracture.  The patient was unable to tolerate medication, and did wish to proceed with a kyphoplasty procedure.  The patient did fully understand the risks and limitations of the procedure.  OPERATIVE DETAILS:  On September 01, 2014, the patient was brought to surgery and general endotracheal anesthesia was administered.  The patient was placed prone on a well-padded flat Jackson bed with gel rolls placed under the patient's chest and hips.  The back was prepped and draped and antibiotics were given.  Time-out was performed.  I then made 2 stab incisions just lateral to the L1 pedicles.  On the left side, a Jamshidi needle was advanced across the L1 pedicle into the vertebral body.  On the right side, the L1 pedicle was extremely small and almost absent.  I therefore did elect to proceed with an extrapedicular approach.  The Jamshidi was advanced into the vertebral body uneventfully.  I then inserted a  kyphoplasty balloon on the left side.  On the right, the extrapedicular bone approach and the small vertebral body was not amenable to insertion of a kyphoplasty balloon. On the left side, the kyphoplasty balloon was inflated with approximately 1 mL of contrast.  I did note partial restoration of the superior endplate.  Of note, I did use a drill and a curette prior to placing the balloon.  Cement was then mixed.  After about 5-6 minutes from initially mixing the cement, approximately 2 mL of cement was introduced into the vertebral body on each side for a total of approximately 4 mL of cement.  I did note excellent interdigitation of the cement.  The cement did approach the superior fracture line.  There was no abnormal extravasation of cement either posteriorly into the spinal canal or into the retroperitoneal space to any substantial degree.  The cement was then allowed to harden.  The Jamshidi's were then removed.  The wound was then irrigated and closed using 3-0 Monocryl.  Bacitracin and sterile dressing was applied, and the patient was woken from general endotracheal anesthesia and transferred to recovery in stable condition.  All instrument counts were correct at the termination of the  procedure.     Estill Bamberg, MD     MD/MEDQ  D:  09/01/2014  T:  09/02/2014  Job:  [id number]

## 2014-09-09 NOTE — Discharge Summary (Signed)
Patient ID: [name] MRN: [id number] DOB/AGE: 70/21/1947 70 y.o.  Admit date: 09/01/2014 Discharge date: 09/02/2014  Admission Diagnoses:  Active Problems:   Compression fracture   Discharge Diagnoses:  Same  Past Medical History  Diagnosis Date  . GERD (gastroesophageal reflux disease)   . Anxiety   . Chronic pain   . Constipation   . Migraine     Surgeries: Procedure(s): KYPHOPLASTY L1 on 09/01/2014   Consultants:   None Discharged Condition: Improved  Hospital Course: KELBY LOTSPEICH is an 69 y.o. female who was admitted 09/01/2014 for operative treatment of compression fracture. Patient has severe unremitting pain that affects sleep, daily activities, and work/hobbies. After pre-op clearance the patient was taken to the operating room on 09/01/2014 and underwent  Procedure(s): KYPHOPLASTY L1.    Patient was given perioperative antibiotics:  Anti-infectives    Start     Dose/Rate Route Frequency Ordered Stop   09/01/14 2000  ceFAZolin (ANCEF) IVPB 1 g/50 mL premix     1 g 100 mL/hr over 30 Minutes Intravenous Every 8 hours 09/01/14 1724 09/02/14 0407   09/01/14 0600  ceFAZolin (ANCEF) IVPB 2 g/50 mL premix     2 g 100 mL/hr over 30 Minutes Intravenous On call to O.R. 08/31/14 1308 09/01/14 1316       Patient was given sequential compression devices, early ambulation to prevent DVT.  Patient benefited maximally from hospital stay and there were no complications.    Recent vital signs: BP 137/83 mmHg  Pulse 73  Temp(Src) 97.9 F (36.6 C) (Oral)  Resp 16  Ht 5' 3.75" (1.619 m)  Wt 54.658 kg (120 lb 8 oz)  BMI 20.85 kg/m2  SpO2 98%  Discharge Medications:     Medication List    STOP taking these medications        levofloxacin 500 MG tablet  Commonly known as:  LEVAQUIN     NORCO PO      TAKE these medications        ALPRAZolam 1 MG tablet  Commonly known as:  XANAX  Take 1 mg by mouth 3 (three) times daily as needed for anxiety. Also  takes daily prn     gabapentin 300 MG capsule  Commonly known as:  NEURONTIN  Take 300 mg by mouth 3 (three) times daily as needed. For pain     lactulose 10 GM/15ML solution  Commonly known as:  CHRONULAC  Take 10 g by mouth 2 (two) times daily.     methadone 5 MG tablet  Commonly known as:  DOLOPHINE  Take 5 mg by mouth at bedtime.     omeprazole 20 MG capsule  Commonly known as:  PRILOSEC  Take 20 mg by mouth daily.     polyethylene glycol packet  Commonly known as:  MIRALAX / GLYCOLAX  Take 17 g by mouth 2 (two) times daily as needed.     sennosides-docusate sodium 8.6-50 MG tablet  Commonly known as:  SENOKOT-S  Take 4 tablets by mouth at bedtime.     traZODone 100 MG tablet  Commonly known as:  DESYREL  Take 300 mg by mouth at bedtime.     zolpidem 10 MG tablet  Commonly known as:  AMBIEN  Take 10 mg by mouth at bedtime.        Diagnostic Studies: Dg Lumbar Spine 2-3 Views  09/01/2014   CLINICAL DATA:  Kyphoplasty.  EXAM: DG C-ARM 61-120 MIN; LUMBAR SPINE - 2-3 VIEW  FLUOROSCOPY TIME:  Radiation Exposure Index (as provided by the fluoroscopic device):  If the device does not provide the exposure index:  Fluoroscopy Time (in minutes and seconds):  2 minutes 9 seconds  Number of Acquired Images:  2  COMPARISON:  08/10/2014  FINDINGS: Two spot fluoroscopic images over the thoracolumbar junction demonstrate evidence of kyphoplasty of a mild L1 compression fracture. Mild spondylosis present.  IMPRESSION: Post kyphoplasty changes of L1.   Electronically Signed   By: Elberta Fortis M.D.   On: 09/01/2014 15:28   Dg C-arm 61-120 Min  09/01/2014   CLINICAL DATA:  Kyphoplasty.  EXAM: DG C-ARM 61-120 MIN; LUMBAR SPINE - 2-3 VIEW  FLUOROSCOPY TIME:  Radiation Exposure Index (as provided by the fluoroscopic device):  If the device does not provide the exposure index:  Fluoroscopy Time (in minutes and seconds):  2 minutes 9 seconds  Number of Acquired Images:  2  COMPARISON:   08/10/2014  FINDINGS: Two spot fluoroscopic images over the thoracolumbar junction demonstrate evidence of kyphoplasty of a mild L1 compression fracture. Mild spondylosis present.  IMPRESSION: Post kyphoplasty changes of L1.   Electronically Signed   By: Elberta Fortis M.D.   On: 09/01/2014 15:28    Disposition: 01-Home or Self Care   POD #1 s/p L1 kyphoplasty, doing well  - encourage ambulation and PO intake - Percocet for pain, Valium for muscle spasms -Written scripts for pain signed and in chart -D/C instructions sheet printed and in chart -D/C today  -F/U in office 2 weeks   Signed: Georga Bora 09/09/2014, 12:34 PM

## 2014-09-13 DIAGNOSIS — R0981 Nasal congestion: Secondary | ICD-10-CM | POA: Diagnosis not present

## 2014-09-20 DIAGNOSIS — J3489 Other specified disorders of nose and nasal sinuses: Secondary | ICD-10-CM | POA: Diagnosis not present

## 2014-09-20 DIAGNOSIS — J310 Chronic rhinitis: Secondary | ICD-10-CM | POA: Diagnosis not present

## 2014-09-21 DIAGNOSIS — Z9889 Other specified postprocedural states: Secondary | ICD-10-CM | POA: Diagnosis not present

## 2014-10-04 DIAGNOSIS — R51 Headache: Secondary | ICD-10-CM | POA: Diagnosis not present

## 2014-10-04 DIAGNOSIS — G8929 Other chronic pain: Secondary | ICD-10-CM | POA: Diagnosis not present

## 2014-10-05 DIAGNOSIS — J343 Hypertrophy of nasal turbinates: Secondary | ICD-10-CM | POA: Diagnosis not present

## 2014-10-05 DIAGNOSIS — T485X1A Poisoning by other anti-common-cold drugs, accidental (unintentional), initial encounter: Secondary | ICD-10-CM | POA: Diagnosis not present

## 2014-10-05 DIAGNOSIS — J310 Chronic rhinitis: Secondary | ICD-10-CM | POA: Diagnosis not present

## 2014-10-05 DIAGNOSIS — J3489 Other specified disorders of nose and nasal sinuses: Secondary | ICD-10-CM | POA: Diagnosis not present

## 2014-10-11 DIAGNOSIS — R51 Headache: Secondary | ICD-10-CM | POA: Diagnosis not present

## 2014-10-11 DIAGNOSIS — M5481 Occipital neuralgia: Secondary | ICD-10-CM | POA: Diagnosis not present

## 2014-10-11 DIAGNOSIS — M542 Cervicalgia: Secondary | ICD-10-CM | POA: Diagnosis not present

## 2014-10-13 DIAGNOSIS — K219 Gastro-esophageal reflux disease without esophagitis: Secondary | ICD-10-CM | POA: Diagnosis not present

## 2014-10-13 DIAGNOSIS — G43809 Other migraine, not intractable, without status migrainosus: Secondary | ICD-10-CM | POA: Diagnosis not present

## 2014-10-13 DIAGNOSIS — Z79899 Other long term (current) drug therapy: Secondary | ICD-10-CM | POA: Diagnosis not present

## 2014-10-13 DIAGNOSIS — Z888 Allergy status to other drugs, medicaments and biological substances status: Secondary | ICD-10-CM | POA: Diagnosis not present

## 2014-10-13 DIAGNOSIS — Z882 Allergy status to sulfonamides status: Secondary | ICD-10-CM | POA: Diagnosis not present

## 2014-10-14 DIAGNOSIS — R51 Headache: Secondary | ICD-10-CM | POA: Diagnosis not present

## 2014-10-14 DIAGNOSIS — G8929 Other chronic pain: Secondary | ICD-10-CM | POA: Diagnosis not present

## 2014-10-20 DIAGNOSIS — G43909 Migraine, unspecified, not intractable, without status migrainosus: Secondary | ICD-10-CM | POA: Diagnosis not present

## 2014-10-20 DIAGNOSIS — I6782 Cerebral ischemia: Secondary | ICD-10-CM | POA: Diagnosis not present

## 2014-10-20 DIAGNOSIS — R51 Headache: Secondary | ICD-10-CM | POA: Diagnosis not present

## 2014-10-21 DIAGNOSIS — R51 Headache: Secondary | ICD-10-CM | POA: Diagnosis not present

## 2014-10-22 DIAGNOSIS — R51 Headache: Secondary | ICD-10-CM | POA: Diagnosis not present

## 2014-10-26 DIAGNOSIS — G894 Chronic pain syndrome: Secondary | ICD-10-CM | POA: Diagnosis not present

## 2014-10-26 DIAGNOSIS — G5791 Unspecified mononeuropathy of right lower limb: Secondary | ICD-10-CM | POA: Diagnosis not present

## 2014-10-27 DIAGNOSIS — R51 Headache: Secondary | ICD-10-CM | POA: Diagnosis not present

## 2014-10-28 DIAGNOSIS — R51 Headache: Secondary | ICD-10-CM | POA: Diagnosis not present

## 2014-10-28 DIAGNOSIS — J3489 Other specified disorders of nose and nasal sinuses: Secondary | ICD-10-CM | POA: Diagnosis not present

## 2014-10-28 DIAGNOSIS — R339 Retention of urine, unspecified: Secondary | ICD-10-CM | POA: Diagnosis not present

## 2014-10-28 DIAGNOSIS — J343 Hypertrophy of nasal turbinates: Secondary | ICD-10-CM | POA: Diagnosis not present

## 2014-10-28 DIAGNOSIS — R351 Nocturia: Secondary | ICD-10-CM | POA: Diagnosis not present

## 2014-11-01 ENCOUNTER — Other Ambulatory Visit: Payer: Self-pay | Admitting: Otolaryngology

## 2014-11-01 DIAGNOSIS — I773 Arterial fibromuscular dysplasia: Secondary | ICD-10-CM | POA: Diagnosis not present

## 2014-11-01 DIAGNOSIS — R51 Headache: Secondary | ICD-10-CM | POA: Diagnosis not present

## 2014-11-03 DIAGNOSIS — R51 Headache: Secondary | ICD-10-CM | POA: Diagnosis not present

## 2014-11-03 DIAGNOSIS — G8929 Other chronic pain: Secondary | ICD-10-CM | POA: Diagnosis not present

## 2014-11-04 DIAGNOSIS — Z79899 Other long term (current) drug therapy: Secondary | ICD-10-CM | POA: Diagnosis not present

## 2014-11-04 DIAGNOSIS — Z888 Allergy status to other drugs, medicaments and biological substances status: Secondary | ICD-10-CM | POA: Diagnosis not present

## 2014-11-04 DIAGNOSIS — R51 Headache: Secondary | ICD-10-CM | POA: Diagnosis not present

## 2014-11-04 DIAGNOSIS — Z882 Allergy status to sulfonamides status: Secondary | ICD-10-CM | POA: Diagnosis not present

## 2014-11-04 DIAGNOSIS — K219 Gastro-esophageal reflux disease without esophagitis: Secondary | ICD-10-CM | POA: Diagnosis not present

## 2014-11-05 DIAGNOSIS — R339 Retention of urine, unspecified: Secondary | ICD-10-CM | POA: Diagnosis not present

## 2014-11-05 DIAGNOSIS — N3281 Overactive bladder: Secondary | ICD-10-CM | POA: Diagnosis not present

## 2014-11-05 DIAGNOSIS — R102 Pelvic and perineal pain: Secondary | ICD-10-CM | POA: Diagnosis not present

## 2014-11-11 DIAGNOSIS — G43709 Chronic migraine without aura, not intractable, without status migrainosus: Secondary | ICD-10-CM | POA: Diagnosis not present

## 2014-11-24 DIAGNOSIS — G43709 Chronic migraine without aura, not intractable, without status migrainosus: Secondary | ICD-10-CM | POA: Diagnosis not present

## 2015-02-08 DIAGNOSIS — R339 Retention of urine, unspecified: Secondary | ICD-10-CM | POA: Diagnosis not present

## 2015-02-28 DIAGNOSIS — G894 Chronic pain syndrome: Secondary | ICD-10-CM | POA: Diagnosis not present

## 2015-02-28 DIAGNOSIS — G5791 Unspecified mononeuropathy of right lower limb: Secondary | ICD-10-CM | POA: Diagnosis not present

## 2015-03-01 DIAGNOSIS — G43709 Chronic migraine without aura, not intractable, without status migrainosus: Secondary | ICD-10-CM | POA: Diagnosis not present

## 2015-03-11 DIAGNOSIS — R339 Retention of urine, unspecified: Secondary | ICD-10-CM | POA: Diagnosis not present

## 2015-03-11 DIAGNOSIS — N3281 Overactive bladder: Secondary | ICD-10-CM | POA: Diagnosis not present

## 2015-03-18 DIAGNOSIS — N319 Neuromuscular dysfunction of bladder, unspecified: Secondary | ICD-10-CM | POA: Diagnosis not present

## 2015-03-18 DIAGNOSIS — R339 Retention of urine, unspecified: Secondary | ICD-10-CM | POA: Diagnosis not present

## 2015-03-18 DIAGNOSIS — N3281 Overactive bladder: Secondary | ICD-10-CM | POA: Diagnosis not present

## 2015-04-26 DIAGNOSIS — M4696 Unspecified inflammatory spondylopathy, lumbar region: Secondary | ICD-10-CM | POA: Diagnosis not present

## 2015-05-02 DIAGNOSIS — M47816 Spondylosis without myelopathy or radiculopathy, lumbar region: Secondary | ICD-10-CM | POA: Diagnosis not present

## 2015-05-02 DIAGNOSIS — M545 Low back pain: Secondary | ICD-10-CM | POA: Diagnosis not present

## 2015-05-03 DIAGNOSIS — N3281 Overactive bladder: Secondary | ICD-10-CM | POA: Diagnosis not present

## 2015-05-03 DIAGNOSIS — R39198 Other difficulties with micturition: Secondary | ICD-10-CM | POA: Diagnosis not present

## 2015-05-04 DIAGNOSIS — M545 Low back pain: Secondary | ICD-10-CM | POA: Diagnosis not present

## 2015-05-09 DIAGNOSIS — M545 Low back pain: Secondary | ICD-10-CM | POA: Diagnosis not present

## 2015-05-11 DIAGNOSIS — M545 Low back pain: Secondary | ICD-10-CM | POA: Diagnosis not present

## 2015-05-16 DIAGNOSIS — M545 Low back pain: Secondary | ICD-10-CM | POA: Diagnosis not present

## 2015-05-18 DIAGNOSIS — M545 Low back pain: Secondary | ICD-10-CM | POA: Diagnosis not present

## 2015-05-23 DIAGNOSIS — M545 Low back pain: Secondary | ICD-10-CM | POA: Diagnosis not present

## 2015-05-25 DIAGNOSIS — M545 Low back pain: Secondary | ICD-10-CM | POA: Diagnosis not present

## 2015-06-02 DIAGNOSIS — G43009 Migraine without aura, not intractable, without status migrainosus: Secondary | ICD-10-CM | POA: Diagnosis not present

## 2015-06-06 DIAGNOSIS — M545 Low back pain: Secondary | ICD-10-CM | POA: Diagnosis not present

## 2015-06-07 DIAGNOSIS — M47816 Spondylosis without myelopathy or radiculopathy, lumbar region: Secondary | ICD-10-CM | POA: Diagnosis not present

## 2015-06-13 DIAGNOSIS — M545 Low back pain: Secondary | ICD-10-CM | POA: Diagnosis not present

## 2015-06-15 DIAGNOSIS — M545 Low back pain: Secondary | ICD-10-CM | POA: Diagnosis not present

## 2015-06-20 DIAGNOSIS — M545 Low back pain: Secondary | ICD-10-CM | POA: Diagnosis not present

## 2015-06-22 DIAGNOSIS — M545 Low back pain: Secondary | ICD-10-CM | POA: Diagnosis not present

## 2015-06-24 DIAGNOSIS — R39198 Other difficulties with micturition: Secondary | ICD-10-CM | POA: Diagnosis not present

## 2015-06-24 DIAGNOSIS — R338 Other retention of urine: Secondary | ICD-10-CM | POA: Diagnosis not present

## 2015-07-01 DIAGNOSIS — R39198 Other difficulties with micturition: Secondary | ICD-10-CM | POA: Diagnosis not present

## 2015-07-01 DIAGNOSIS — R338 Other retention of urine: Secondary | ICD-10-CM | POA: Diagnosis not present

## 2015-07-06 DIAGNOSIS — R39198 Other difficulties with micturition: Secondary | ICD-10-CM | POA: Diagnosis not present

## 2015-07-06 DIAGNOSIS — R338 Other retention of urine: Secondary | ICD-10-CM | POA: Diagnosis not present

## 2015-07-15 DIAGNOSIS — J3489 Other specified disorders of nose and nasal sinuses: Secondary | ICD-10-CM | POA: Insufficient documentation

## 2015-07-15 DIAGNOSIS — Z780 Asymptomatic menopausal state: Secondary | ICD-10-CM | POA: Diagnosis not present

## 2015-07-15 DIAGNOSIS — R5382 Chronic fatigue, unspecified: Secondary | ICD-10-CM | POA: Diagnosis not present

## 2015-07-15 DIAGNOSIS — B0223 Postherpetic polyneuropathy: Secondary | ICD-10-CM | POA: Insufficient documentation

## 2015-07-15 DIAGNOSIS — N390 Urinary tract infection, site not specified: Secondary | ICD-10-CM | POA: Diagnosis not present

## 2015-07-15 DIAGNOSIS — R351 Nocturia: Secondary | ICD-10-CM | POA: Insufficient documentation

## 2015-07-15 DIAGNOSIS — R339 Retention of urine, unspecified: Secondary | ICD-10-CM | POA: Insufficient documentation

## 2015-07-15 DIAGNOSIS — K219 Gastro-esophageal reflux disease without esophagitis: Secondary | ICD-10-CM | POA: Insufficient documentation

## 2015-07-15 DIAGNOSIS — K589 Irritable bowel syndrome without diarrhea: Secondary | ICD-10-CM | POA: Insufficient documentation

## 2015-08-22 DIAGNOSIS — M62830 Muscle spasm of back: Secondary | ICD-10-CM | POA: Diagnosis not present

## 2015-08-22 DIAGNOSIS — M9903 Segmental and somatic dysfunction of lumbar region: Secondary | ICD-10-CM | POA: Diagnosis not present

## 2015-08-29 DIAGNOSIS — M549 Dorsalgia, unspecified: Secondary | ICD-10-CM | POA: Diagnosis not present

## 2015-08-30 ENCOUNTER — Other Ambulatory Visit: Payer: Self-pay | Admitting: Neurosurgery

## 2015-08-30 DIAGNOSIS — M549 Dorsalgia, unspecified: Secondary | ICD-10-CM

## 2015-09-07 ENCOUNTER — Other Ambulatory Visit: Payer: Medicare Other

## 2015-09-13 DIAGNOSIS — R39198 Other difficulties with micturition: Secondary | ICD-10-CM | POA: Diagnosis not present

## 2015-09-13 DIAGNOSIS — R338 Other retention of urine: Secondary | ICD-10-CM | POA: Diagnosis not present

## 2015-09-13 DIAGNOSIS — N3281 Overactive bladder: Secondary | ICD-10-CM | POA: Diagnosis not present

## 2015-09-14 ENCOUNTER — Ambulatory Visit
Admission: RE | Admit: 2015-09-14 | Discharge: 2015-09-14 | Disposition: A | Discharge status: Home / Self Care | Payer: Medicare Other | Source: Ambulatory Visit | Attending: Neurosurgery | Admitting: Neurosurgery

## 2015-09-14 DIAGNOSIS — M549 Dorsalgia, unspecified: Secondary | ICD-10-CM

## 2015-09-14 DIAGNOSIS — M4806 Spinal stenosis, lumbar region: Secondary | ICD-10-CM | POA: Diagnosis not present

## 2015-09-14 MED ORDER — IOHEXOL 180 MG/ML  SOLN
15.0000 mL | Freq: Once | INTRAMUSCULAR | Status: AC | PRN
  Administered 2015-09-14: 15 mL via INTRATHECAL

## 2015-09-14 MED ORDER — DIAZEPAM 5 MG PO TABS
5.0000 mg | ORAL_TABLET | Freq: Once | ORAL | Status: AC
  Administered 2015-09-14: 5 mg via ORAL

## 2015-09-14 NOTE — Progress Notes (Signed)
Patient states she has been off Trazodone and Seroquel for the past two days.

## 2015-09-14 NOTE — Discharge Instructions (Signed)
Myelogram Discharge Instructions  1. Go home and rest quietly for the next 24 hours.  It is important to lie flat for the next 24 hours.  Get up only to go to the restroom.  You may lie in the bed or on a couch on your back, your stomach, your left side or your right side.  You may have one pillow under your head.  You may have pillows between your knees while you are on your side or under your knees while you are on your back.  2. DO NOT drive today.  Recline the seat as far back as it will go, while still wearing your seat belt, on the way home.  3. You may get up to go to the bathroom as needed.  You may sit up for 10 minutes to eat.  You may resume your normal diet and medications unless otherwise indicated.  Drink lots of extra fluids today and tomorrow.  4. The incidence of headache, nausea, or vomiting is about 5% (one in 20 patients).  If you develop a headache, lie flat and drink plenty of fluids until the headache goes away.  Caffeinated beverages may be helpful.  If you develop severe nausea and vomiting or a headache that does not go away with flat bed rest, call [phone].  5. You may resume normal activities after your 24 hours of bed rest is over; however, do not exert yourself strongly or do any heavy lifting tomorrow. If when you get up you have a headache when standing, go back to bed and force fluids for another 24 hours.  6. Call your physician for a follow-up appointment.  The results of your myelogram will be sent directly to your physician by the following day.  7. If you have any questions or if complications develop after you arrive home, please call [phone].  Discharge instructions have been explained to the patient.  The patient, or the person responsible for the patient, fully understands these instructions.       May resume Seroquel and Trazodone on September 15, 2015, after 9:30 am.

## 2015-09-22 DIAGNOSIS — M4806 Spinal stenosis, lumbar region: Secondary | ICD-10-CM | POA: Diagnosis not present

## 2015-09-23 ENCOUNTER — Other Ambulatory Visit: Payer: Self-pay | Admitting: Neurosurgery

## 2015-09-26 ENCOUNTER — Encounter (HOSPITAL_COMMUNITY): Payer: Self-pay | Admitting: *Deleted

## 2015-09-26 NOTE — H&P (Signed)
[name] is an 70 y.o. female.   Chief Complaint: pain both legs HPI: lumbar pain with radiation to both extremities no better after she had a kyphoplasty about one tear ago. Physical therapy is making her pain worse. She had an outpatient myelogram  Past Medical History  Diagnosis Date  . GERD (gastroesophageal reflux disease)   . Anxiety   . Chronic pain   . Constipation   . Pneumonia 2014ish  . Migraine     last one since 05/2015  . History of blood transfusion     Past Surgical History  Procedure Laterality Date  . Appendectomy    . Bladder surgery      Mesh implanted  . Shoulder surgery Left     X 2  . Tonsillectomy    . Abdominal wall mesh  removal    . Cataract extraction w/ intraocular lens  implant, bilateral Bilateral   . Kyphoplasty N/A 09/01/2014    Procedure: KYPHOPLASTY;  Surgeon: Emilee Hero, MD;  Location: [facility] OR;  Service: Orthopedics;  Laterality: N/A;  Lumbar 1 kyphoplasty  . Oophorectomy Right   . Colonoscopy      Family History  Problem Relation Age of Onset  . Coronary artery disease Father 45  . Coronary artery disease Mother 67   Social History:  reports that she has never smoked. She does not have any smokeless tobacco history on file. She reports that she does not drink alcohol or use illicit drugs.  Allergies:  Allergies  Allergen Reactions  . Sulfa Antibiotics Hives    No prescriptions prior to admission    No results found for this or any previous visit (from the past 48 hour(s)). No results found.  Review of Systems  Constitutional: Negative.   HENT: Negative.   Eyes: Negative.   Respiratory: Negative.   Cardiovascular: Negative.   Gastrointestinal: Negative.   Genitourinary:       Self cath q day  Musculoskeletal: Positive for back pain.  Skin: Negative.   Neurological: Positive for sensory change and focal weakness.  Endo/Heme/Allergies: Negative.   Psychiatric/Behavioral: Negative.     There were no  vitals taken for this visit. Physical Exam  Hent, nl. Neck, nl. Cv, l. Lungs,clear. Abdomes, no masses. Extremities, nl. NEURO WEAKNESS OF DF BOTH FEET. SENSORY, NL. FEMORAL STRETCH  Maneuver positive bilaterally. Myelogram showed stenosis at l4-5 and possible a left l3-4 hnp Assessment/Plan Patient to go ahead with l4-5 laminnectomies and partial at l3 to investigate the l3-4 disc space. She is aware of risks and benefits  Karn Cassis, MD 09/26/2015, 6:01 PM

## 2015-09-26 NOTE — Anesthesia Preprocedure Evaluation (Addendum)
Anesthesia Evaluation  Patient identified by MRN, date of birth, ID band Patient awake    Reviewed: Allergy & Precautions, NPO status , Patient's Chart, lab work & pertinent test results, reviewed documented beta blocker date and time   History of Anesthesia Complications Negative for: history of anesthetic complications  Airway Mallampati: III  TM Distance: >3 FB Neck ROM: Full    Dental  (+) Teeth Intact, Dental Advisory Given   Pulmonary neg pulmonary ROS,    breath sounds clear to auscultation       Cardiovascular negative cardio ROS   Rhythm:Regular  EKG OK 03/2014 ST changes, prolong QT   Neuro/Psych  Headaches, PSYCHIATRIC DISORDERS Anxiety Depression    GI/Hepatic Neg liver ROS, GERD  Medicated and Controlled,  Endo/Other  negative endocrine ROS  Renal/GU negative Renal ROS     Musculoskeletal negative musculoskeletal ROS (+)   Abdominal (+)  Abdomen: soft.    Peds  Hematology 12/37 09/27/2015   Anesthesia Other Findings   Reproductive/Obstetrics negative OB ROS                           Anesthesia Physical Anesthesia Plan  ASA: II  Anesthesia Plan: General   Post-op Pain Management:    Induction: Intravenous  Airway Management Planned: Oral ETT and Video Laryngoscope Planned  Additional Equipment:   Intra-op Plan:   Post-operative Plan: Extubation in OR  Informed Consent: I have reviewed the patients History and Physical, chart, labs and discussed the procedure including the risks, benefits and alternatives for the proposed anesthesia with the patient or authorized representative who has indicated his/her understanding and acceptance.     Plan Discussed with:   Anesthesia Plan Comments: (Check AM labs, Glide, anterior, intubated 6.5 tube previous GA, check am EKG)        Anesthesia Quick Evaluation

## 2015-09-27 ENCOUNTER — Inpatient Hospital Stay (HOSPITAL_COMMUNITY): Payer: Medicare Other | Admitting: Anesthesiology

## 2015-09-27 ENCOUNTER — Encounter (HOSPITAL_COMMUNITY)
Admission: RE | Disposition: A | Discharge status: Home / Self Care | Payer: Self-pay | Source: Ambulatory Visit | Attending: Neurosurgery

## 2015-09-27 ENCOUNTER — Inpatient Hospital Stay (HOSPITAL_COMMUNITY): Payer: Medicare Other

## 2015-09-27 ENCOUNTER — Inpatient Hospital Stay (HOSPITAL_COMMUNITY)
Admission: RE | Admit: 2015-09-27 | Discharge: 2015-09-29 | DRG: 517 | Disposition: A | Discharge status: Home / Self Care | Payer: Medicare Other | Source: Ambulatory Visit | Attending: Neurosurgery | Admitting: Neurosurgery

## 2015-09-27 DIAGNOSIS — M549 Dorsalgia, unspecified: Secondary | ICD-10-CM

## 2015-09-27 DIAGNOSIS — M545 Low back pain: Secondary | ICD-10-CM | POA: Diagnosis not present

## 2015-09-27 DIAGNOSIS — M4806 Spinal stenosis, lumbar region: Secondary | ICD-10-CM | POA: Diagnosis present

## 2015-09-27 DIAGNOSIS — G43909 Migraine, unspecified, not intractable, without status migrainosus: Secondary | ICD-10-CM | POA: Diagnosis present

## 2015-09-27 DIAGNOSIS — F419 Anxiety disorder, unspecified: Secondary | ICD-10-CM | POA: Diagnosis present

## 2015-09-27 DIAGNOSIS — K219 Gastro-esophageal reflux disease without esophagitis: Secondary | ICD-10-CM | POA: Diagnosis present

## 2015-09-27 DIAGNOSIS — G8929 Other chronic pain: Secondary | ICD-10-CM | POA: Diagnosis not present

## 2015-09-27 DIAGNOSIS — R338 Other retention of urine: Secondary | ICD-10-CM | POA: Diagnosis present

## 2015-09-27 DIAGNOSIS — M5416 Radiculopathy, lumbar region: Secondary | ICD-10-CM | POA: Diagnosis present

## 2015-09-27 DIAGNOSIS — Z882 Allergy status to sulfonamides status: Secondary | ICD-10-CM

## 2015-09-27 DIAGNOSIS — Z9889 Other specified postprocedural states: Secondary | ICD-10-CM | POA: Diagnosis not present

## 2015-09-27 DIAGNOSIS — M48061 Spinal stenosis, lumbar region without neurogenic claudication: Secondary | ICD-10-CM | POA: Diagnosis present

## 2015-09-27 HISTORY — DX: Personal history of other medical treatment: Z92.89

## 2015-09-27 HISTORY — DX: Pneumonia, unspecified organism: J18.9

## 2015-09-27 HISTORY — PX: LAMINECTOMY WITH POSTERIOR LATERAL ARTHRODESIS LEVEL 1: SHX6335

## 2015-09-27 LAB — BASIC METABOLIC PANEL
ANION GAP: 9 (ref 5–15)
BUN: 8 mg/dL (ref 6–20)
CALCIUM: 9.6 mg/dL (ref 8.9–10.3)
CHLORIDE: 110 mmol/L (ref 101–111)
CO2: 22 mmol/L (ref 22–32)
CREATININE: 0.75 mg/dL (ref 0.44–1.00)
GFR calc non Af Amer: >60 mL/min (ref >60)
Glucose, Bld: 90 mg/dL (ref 65–99)
Potassium: 4 mmol/L (ref 3.5–5.1)
SODIUM: 141 mmol/L (ref 135–145)

## 2015-09-27 LAB — CBC
HCT: 37.7 % (ref 36.0–46.0)
HEMOGLOBIN: 11.8 g/dL — AB (ref 12.0–15.0)
MCH: 28 pg (ref 26.0–34.0)
MCHC: 31.3 g/dL (ref 30.0–36.0)
MCV: 89.5 fL (ref 78.0–100.0)
PLATELETS: 145 10*3/uL — AB (ref 150–400)
RBC: 4.21 MIL/uL (ref 3.87–5.11)
RDW: 14.5 % (ref 11.5–15.5)
WBC: 5.5 10*3/uL (ref 4.0–10.5)

## 2015-09-27 LAB — TYPE AND SCREEN
ABO/RH(D): A POS
ANTIBODY SCREEN: NEGATIVE

## 2015-09-27 LAB — SURGICAL PCR SCREEN
MRSA, PCR: NEGATIVE
Staphylococcus aureus: NEGATIVE

## 2015-09-27 SURGERY — LAMINECTOMY WITH POSTERIOR LATERAL ARTHRODESIS LEVEL 1
Anesthesia: General | Site: Back

## 2015-09-27 MED ORDER — FENTANYL CITRATE (PF) 250 MCG/5ML IJ SOLN
INTRAMUSCULAR | Status: AC
  Filled 2015-09-27: qty 5

## 2015-09-27 MED ORDER — MENTHOL 3 MG MT LOZG: 1.0000 | LOZENGE | OROMUCOSAL | Status: DC | PRN

## 2015-09-27 MED ORDER — ZOLPIDEM TARTRATE 5 MG PO TABS
5.0000 mg | ORAL_TABLET | Freq: Every evening | ORAL | Status: DC | PRN
  Administered 2015-09-27 – 2015-09-28 (×2): 5 mg via ORAL
  Filled 2015-09-27 (×2): qty 1

## 2015-09-27 MED ORDER — SODIUM CHLORIDE 0.9 % IJ SOLN
INTRAMUSCULAR | Status: AC
  Filled 2015-09-27: qty 10

## 2015-09-27 MED ORDER — VERAPAMIL HCL ER 120 MG PO TBCR
120.0000 mg | EXTENDED_RELEASE_TABLET | Freq: Every day | ORAL | Status: DC
  Administered 2015-09-27 – 2015-09-28 (×2): 120 mg via ORAL
  Filled 2015-09-27 (×3): qty 1

## 2015-09-27 MED ORDER — ACETAMINOPHEN 650 MG RE SUPP: 650.0000 mg | RECTAL | Status: DC | PRN

## 2015-09-27 MED ORDER — SODIUM CHLORIDE 0.9 % IV SOLN: 250.0000 mL | INTRAVENOUS | Status: DC

## 2015-09-27 MED ORDER — HEMOSTATIC AGENTS (NO CHARGE) OPTIME
TOPICAL | Status: DC | PRN
Start: 2015-09-27 — End: 2015-09-27
  Administered 2015-09-27: 1 via TOPICAL

## 2015-09-27 MED ORDER — ROCURONIUM BROMIDE 100 MG/10ML IV SOLN
INTRAVENOUS | Status: DC | PRN
  Administered 2015-09-27: 40 mg via INTRAVENOUS

## 2015-09-27 MED ORDER — SODIUM CHLORIDE 0.9% FLUSH
3.0000 mL | INTRAVENOUS | Status: DC | PRN
  Administered 2015-09-29: 3 mL via INTRAVENOUS
  Filled 2015-09-27: qty 3

## 2015-09-27 MED ORDER — PHENYLEPHRINE 40 MCG/ML (10ML) SYRINGE FOR IV PUSH (FOR BLOOD PRESSURE SUPPORT)
PREFILLED_SYRINGE | INTRAVENOUS | Status: AC
  Filled 2015-09-27: qty 10

## 2015-09-27 MED ORDER — LIDOCAINE HCL (CARDIAC) 20 MG/ML IV SOLN
INTRAVENOUS | Status: AC
  Filled 2015-09-27: qty 5

## 2015-09-27 MED ORDER — THROMBIN 5000 UNITS EX SOLR
OROMUCOSAL | Status: DC | PRN
  Administered 2015-09-27: 10 mL via TOPICAL

## 2015-09-27 MED ORDER — ARTIFICIAL TEARS OP OINT
TOPICAL_OINTMENT | OPHTHALMIC | Status: AC
  Filled 2015-09-27: qty 3.5

## 2015-09-27 MED ORDER — SODIUM CHLORIDE 0.9% FLUSH
3.0000 mL | Freq: Two times a day (BID) | INTRAVENOUS | Status: DC
  Administered 2015-09-28 – 2015-09-29 (×3): 3 mL via INTRAVENOUS

## 2015-09-27 MED ORDER — MORPHINE SULFATE (PF) 2 MG/ML IV SOLN
INTRAVENOUS | Status: AC
  Filled 2015-09-27: qty 1

## 2015-09-27 MED ORDER — CEFAZOLIN SODIUM-DEXTROSE 2-3 GM-% IV SOLR
INTRAVENOUS | Status: DC | PRN
  Administered 2015-09-27: 2 g via INTRAVENOUS

## 2015-09-27 MED ORDER — EPHEDRINE SULFATE 50 MG/ML IJ SOLN
INTRAMUSCULAR | Status: DC | PRN
  Administered 2015-09-27: 25 mg via INTRAVENOUS
  Administered 2015-09-27: 10 mg via INTRAVENOUS
  Administered 2015-09-27: 5 mg via INTRAVENOUS

## 2015-09-27 MED ORDER — ROCURONIUM BROMIDE 50 MG/5ML IV SOLN
INTRAVENOUS | Status: AC
  Filled 2015-09-27: qty 1

## 2015-09-27 MED ORDER — SUCCINYLCHOLINE CHLORIDE 20 MG/ML IJ SOLN
INTRAMUSCULAR | Status: AC
  Filled 2015-09-27: qty 1

## 2015-09-27 MED ORDER — PHENOL 1.4 % MT LIQD: 1.0000 | OROMUCOSAL | Status: DC | PRN

## 2015-09-27 MED ORDER — FENTANYL CITRATE (PF) 100 MCG/2ML IJ SOLN
INTRAMUSCULAR | Status: AC
  Filled 2015-09-27: qty 2

## 2015-09-27 MED ORDER — SUCCINYLCHOLINE CHLORIDE 20 MG/ML IJ SOLN
INTRAMUSCULAR | Status: DC | PRN
Start: 2015-09-27 — End: 2015-09-27
  Administered 2015-09-27: 100 mg via INTRAVENOUS

## 2015-09-27 MED ORDER — OXYCODONE-ACETAMINOPHEN 5-325 MG PO TABS
1.0000 | ORAL_TABLET | ORAL | Status: DC | PRN
  Administered 2015-09-28 – 2015-09-29 (×5): 2 via ORAL
  Filled 2015-09-27 (×5): qty 2

## 2015-09-27 MED ORDER — ONDANSETRON HCL 4 MG/2ML IJ SOLN
INTRAMUSCULAR | Status: DC | PRN
  Administered 2015-09-27: 4 mg via INTRAVENOUS

## 2015-09-27 MED ORDER — ALBUMIN HUMAN 5 % IV SOLN
INTRAVENOUS | Status: DC | PRN
  Administered 2015-09-27 (×3): via INTRAVENOUS

## 2015-09-27 MED ORDER — BUPIVACAINE-EPINEPHRINE (PF) 0.5% -1:200000 IJ SOLN
INTRAMUSCULAR | Status: DC | PRN
  Administered 2015-09-27: 10 mL via PERINEURAL

## 2015-09-27 MED ORDER — SUGAMMADEX SODIUM 200 MG/2ML IV SOLN
INTRAVENOUS | Status: DC | PRN
  Administered 2015-09-27: 100 mg via INTRAVENOUS

## 2015-09-27 MED ORDER — CEFAZOLIN SODIUM 1-5 GM-% IV SOLN
1.0000 g | Freq: Three times a day (TID) | INTRAVENOUS | Status: AC
  Administered 2015-09-27 – 2015-09-28 (×2): 1 g via INTRAVENOUS
  Filled 2015-09-27 (×3): qty 50

## 2015-09-27 MED ORDER — ONDANSETRON HCL 4 MG/2ML IJ SOLN
INTRAMUSCULAR | Status: AC
  Filled 2015-09-27: qty 2

## 2015-09-27 MED ORDER — LACTATED RINGERS IV SOLN
INTRAVENOUS | Status: DC
  Administered 2015-09-27 (×3): via INTRAVENOUS

## 2015-09-27 MED ORDER — GABAPENTIN 800 MG PO TABS
800.0000 mg | ORAL_TABLET | Freq: Four times a day (QID) | ORAL | Status: DC
  Administered 2015-09-27: 800 mg via ORAL
  Filled 2015-09-27 (×2): qty 1

## 2015-09-27 MED ORDER — GLYCOPYRROLATE 0.2 MG/ML IJ SOLN
INTRAMUSCULAR | Status: AC
  Filled 2015-09-27: qty 1

## 2015-09-27 MED ORDER — ONDANSETRON HCL 4 MG/2ML IJ SOLN: 4.0000 mg | INTRAMUSCULAR | Status: DC | PRN

## 2015-09-27 MED ORDER — PROPOFOL 10 MG/ML IV BOLUS
INTRAVENOUS | Status: AC
  Filled 2015-09-27: qty 40

## 2015-09-27 MED ORDER — THROMBIN 5000 UNITS EX SOLR
CUTANEOUS | Status: DC | PRN
  Administered 2015-09-27 (×2): 5000 [IU] via TOPICAL

## 2015-09-27 MED ORDER — MEPERIDINE HCL 25 MG/ML IJ SOLN: 6.2500 mg | INTRAMUSCULAR | Status: DC | PRN

## 2015-09-27 MED ORDER — PROMETHAZINE HCL 25 MG/ML IJ SOLN: 6.2500 mg | INTRAMUSCULAR | Status: DC | PRN

## 2015-09-27 MED ORDER — PROPOFOL 10 MG/ML IV BOLUS
INTRAVENOUS | Status: DC | PRN
  Administered 2015-09-27: 110 mg via INTRAVENOUS
  Administered 2015-09-27: 60 mg via INTRAVENOUS

## 2015-09-27 MED ORDER — FENTANYL CITRATE (PF) 100 MCG/2ML IJ SOLN
25.0000 ug | INTRAMUSCULAR | Status: AC | PRN
Start: 2015-09-27 — End: 2015-09-27
  Administered 2015-09-27 (×6): 25 ug via INTRAVENOUS

## 2015-09-27 MED ORDER — PHENYLEPHRINE HCL 10 MG/ML IJ SOLN
INTRAMUSCULAR | Status: DC | PRN
  Administered 2015-09-27 (×3): 40 ug via INTRAVENOUS

## 2015-09-27 MED ORDER — PHENYLEPHRINE HCL 10 MG/ML IJ SOLN
10.0000 mg | INTRAVENOUS | Status: DC | PRN
  Administered 2015-09-27 (×2): 20 ug/min via INTRAVENOUS

## 2015-09-27 MED ORDER — MUPIROCIN 2 % EX OINT
1.0000 "application " | TOPICAL_OINTMENT | Freq: Once | CUTANEOUS | Status: AC
  Administered 2015-09-27: 1 via TOPICAL
  Filled 2015-09-27: qty 22

## 2015-09-27 MED ORDER — MIDAZOLAM HCL 5 MG/5ML IJ SOLN
INTRAMUSCULAR | Status: DC | PRN
  Administered 2015-09-27: 1 mg via INTRAVENOUS

## 2015-09-27 MED ORDER — LIDOCAINE HCL (CARDIAC) 20 MG/ML IV SOLN
INTRAVENOUS | Status: DC | PRN
  Administered 2015-09-27: 100 mg via INTRAVENOUS

## 2015-09-27 MED ORDER — SODIUM CHLORIDE 0.9 % IV SOLN
INTRAVENOUS | Status: DC
  Administered 2015-09-27: 19:00:00 via INTRAVENOUS

## 2015-09-27 MED ORDER — GLYCOPYRROLATE 0.2 MG/ML IJ SOLN
INTRAMUSCULAR | Status: DC | PRN
  Administered 2015-09-27: 0.2 mg via INTRAVENOUS

## 2015-09-27 MED ORDER — VERAPAMIL HCL 120 MG PO TABS: 120.0000 mg | ORAL_TABLET | Freq: Every day | ORAL | Status: DC

## 2015-09-27 MED ORDER — 0.9 % SODIUM CHLORIDE (POUR BTL) OPTIME
TOPICAL | Status: DC | PRN
Start: 2015-09-27 — End: 2015-09-27
  Administered 2015-09-27: 1000 mL

## 2015-09-27 MED ORDER — PANTOPRAZOLE SODIUM 40 MG PO TBEC
40.0000 mg | DELAYED_RELEASE_TABLET | Freq: Every day | ORAL | Status: DC
  Administered 2015-09-28 – 2015-09-29 (×2): 40 mg via ORAL
  Filled 2015-09-27 (×2): qty 1

## 2015-09-27 MED ORDER — FENTANYL CITRATE (PF) 100 MCG/2ML IJ SOLN
INTRAMUSCULAR | Status: DC | PRN
  Administered 2015-09-27: 50 ug via INTRAVENOUS
  Administered 2015-09-27: 100 ug via INTRAVENOUS

## 2015-09-27 MED ORDER — QUETIAPINE FUMARATE 300 MG PO TABS
600.0000 mg | ORAL_TABLET | Freq: Every day | ORAL | Status: DC
  Administered 2015-09-27: 600 mg via ORAL
  Filled 2015-09-27 (×3): qty 2

## 2015-09-27 MED ORDER — EPHEDRINE SULFATE 50 MG/ML IJ SOLN
INTRAMUSCULAR | Status: AC
  Filled 2015-09-27: qty 1

## 2015-09-27 MED ORDER — ARTIFICIAL TEARS OP OINT
TOPICAL_OINTMENT | OPHTHALMIC | Status: DC | PRN
  Administered 2015-09-27: 1 via OPHTHALMIC

## 2015-09-27 MED ORDER — DIAZEPAM 5 MG PO TABS
5.0000 mg | ORAL_TABLET | Freq: Four times a day (QID) | ORAL | Status: DC | PRN
  Administered 2015-09-29: 5 mg via ORAL
  Filled 2015-09-27: qty 1

## 2015-09-27 MED ORDER — ALPRAZOLAM 0.5 MG PO TABS
1.0000 mg | ORAL_TABLET | Freq: Three times a day (TID) | ORAL | Status: DC | PRN
  Administered 2015-09-28 – 2015-09-29 (×2): 1 mg via ORAL
  Filled 2015-09-27 (×2): qty 2

## 2015-09-27 MED ORDER — MORPHINE SULFATE (PF) 2 MG/ML IV SOLN
1.0000 mg | INTRAVENOUS | Status: DC | PRN
  Administered 2015-09-27 (×2): 2 mg via INTRAVENOUS
  Filled 2015-09-27: qty 1

## 2015-09-27 MED ORDER — ACETAMINOPHEN 325 MG PO TABS: 650.0000 mg | ORAL_TABLET | ORAL | Status: DC | PRN

## 2015-09-27 MED ORDER — TRAZODONE HCL 150 MG PO TABS
400.0000 mg | ORAL_TABLET | Freq: Every day | ORAL | Status: DC
  Administered 2015-09-27: 400 mg via ORAL
  Filled 2015-09-27 (×3): qty 1

## 2015-09-27 MED ORDER — SUGAMMADEX SODIUM 200 MG/2ML IV SOLN
INTRAVENOUS | Status: AC
  Filled 2015-09-27: qty 2

## 2015-09-27 MED ORDER — TOPIRAMATE 25 MG PO TABS
25.0000 mg | ORAL_TABLET | Freq: Every day | ORAL | Status: DC
  Administered 2015-09-27 – 2015-09-28 (×2): 25 mg via ORAL
  Filled 2015-09-27 (×2): qty 1

## 2015-09-27 MED ORDER — MIDAZOLAM HCL 2 MG/2ML IJ SOLN
INTRAMUSCULAR | Status: AC
  Filled 2015-09-27: qty 2

## 2015-09-27 SURGICAL SUPPLY — 55 items
APL SKNCLS STERI-STRIP NONHPOA (GAUZE/BANDAGES/DRESSINGS) ×1
BENZOIN TINCTURE PRP APPL 2/3 (GAUZE/BANDAGES/DRESSINGS) ×2 IMPLANT
BLADE CLIPPER SURG (BLADE) IMPLANT
BUR ACORN 6.0 (BURR) IMPLANT
BUR MATCHSTICK NEURO 3.0 LAGG (BURR) ×2 IMPLANT
CANISTER SUCT 3000ML PPV (MISCELLANEOUS) ×2 IMPLANT
CONT SPEC 4OZ CLIKSEAL STRL BL (MISCELLANEOUS) ×2 IMPLANT
DRAPE LAPAROTOMY 100X72X124 (DRAPES) ×2 IMPLANT
DRAPE MICROSCOPE LEICA (MISCELLANEOUS) ×2 IMPLANT
DRAPE POUCH INSTRU U-SHP 10X18 (DRAPES) ×2 IMPLANT
DRSG OPSITE 4X5.5 SM (GAUZE/BANDAGES/DRESSINGS) ×1 IMPLANT
DRSG OPSITE POSTOP 4X6 (GAUZE/BANDAGES/DRESSINGS) ×1 IMPLANT
DRSG PAD ABDOMINAL 8X10 ST (GAUZE/BANDAGES/DRESSINGS) IMPLANT
DURAPREP 26ML APPLICATOR (WOUND CARE) ×2 IMPLANT
ELECT REM PT RETURN 9FT ADLT (ELECTROSURGICAL) ×2
ELECTRODE REM PT RTRN 9FT ADLT (ELECTROSURGICAL) ×1 IMPLANT
GAUZE SPONGE 4X4 12PLY STRL (GAUZE/BANDAGES/DRESSINGS) ×2 IMPLANT
GAUZE SPONGE 4X4 16PLY XRAY LF (GAUZE/BANDAGES/DRESSINGS) IMPLANT
GLOVE BIOGEL M 8.0 STRL (GLOVE) ×2 IMPLANT
GLOVE EXAM NITRILE LRG STRL (GLOVE) IMPLANT
GLOVE EXAM NITRILE MD LF STRL (GLOVE) IMPLANT
GLOVE EXAM NITRILE XL STR (GLOVE) IMPLANT
GLOVE EXAM NITRILE XS STR PU (GLOVE) IMPLANT
GOWN STRL REUS W/ TWL LRG LVL3 (GOWN DISPOSABLE) ×1 IMPLANT
GOWN STRL REUS W/ TWL XL LVL3 (GOWN DISPOSABLE) IMPLANT
GOWN STRL REUS W/TWL 2XL LVL3 (GOWN DISPOSABLE) IMPLANT
GOWN STRL REUS W/TWL LRG LVL3 (GOWN DISPOSABLE) ×2
GOWN STRL REUS W/TWL XL LVL3 (GOWN DISPOSABLE)
HEMOSTAT POWDER KIT SURGIFOAM (HEMOSTASIS) ×1 IMPLANT
KIT BASIN OR (CUSTOM PROCEDURE TRAY) ×2 IMPLANT
KIT ROOM TURNOVER OR (KITS) ×2 IMPLANT
NDL HYPO 18GX1.5 BLUNT FILL (NEEDLE) IMPLANT
NDL HYPO 21X1.5 SAFETY (NEEDLE) IMPLANT
NDL HYPO 25X1 1.5 SAFETY (NEEDLE) IMPLANT
NDL SPNL 20GX3.5 QUINCKE YW (NEEDLE) IMPLANT
NEEDLE HYPO 18GX1.5 BLUNT FILL (NEEDLE) IMPLANT
NEEDLE HYPO 21X1.5 SAFETY (NEEDLE) IMPLANT
NEEDLE HYPO 25X1 1.5 SAFETY (NEEDLE) IMPLANT
NEEDLE SPNL 20GX3.5 QUINCKE YW (NEEDLE) IMPLANT
NS IRRIG 1000ML POUR BTL (IV SOLUTION) ×2 IMPLANT
PACK LAMINECTOMY NEURO (CUSTOM PROCEDURE TRAY) ×2 IMPLANT
PAD ARMBOARD 7.5X6 YLW CONV (MISCELLANEOUS) ×6 IMPLANT
PATTIES SURGICAL .5 X1 (DISPOSABLE) ×2 IMPLANT
RUBBERBAND STERILE (MISCELLANEOUS) ×4 IMPLANT
SPONGE LAP 4X18 X RAY DECT (DISPOSABLE) IMPLANT
SPONGE SURGIFOAM ABS GEL SZ50 (HEMOSTASIS) ×2 IMPLANT
STRIP CLOSURE SKIN 1/2X4 (GAUZE/BANDAGES/DRESSINGS) ×2 IMPLANT
SUT VIC AB 0 CT1 18XCR BRD8 (SUTURE) ×1 IMPLANT
SUT VIC AB 0 CT1 8-18 (SUTURE) ×2
SUT VIC AB 2-0 CP2 18 (SUTURE) ×2 IMPLANT
SUT VIC AB 3-0 SH 8-18 (SUTURE) ×2 IMPLANT
SYR 5ML LL (SYRINGE) IMPLANT
TOWEL OR 17X24 6PK STRL BLUE (TOWEL DISPOSABLE) ×2 IMPLANT
TOWEL OR 17X26 10 PK STRL BLUE (TOWEL DISPOSABLE) ×2 IMPLANT
WATER STERILE IRR 1000ML POUR (IV SOLUTION) ×2 IMPLANT

## 2015-09-27 NOTE — Anesthesia Postprocedure Evaluation (Signed)
Anesthesia Post Note  Patient: [name]  Procedure(s) Performed: Procedure(s) (LRB): L3-4, 4-5 Laminectomy with posterior lateral arthrodesis (N/A)  Patient location during evaluation: PACU Anesthesia Type: General Level of consciousness: awake and alert Pain management: pain level controlled Vital Signs Assessment: post-procedure vital signs reviewed and stable Respiratory status: spontaneous breathing, nonlabored ventilation, respiratory function stable and patient connected to nasal cannula oxygen Cardiovascular status: blood pressure returned to baseline and stable Postop Assessment: no signs of nausea or vomiting Anesthetic complications: no    Last Vitals:  Filed Vitals:   09/27/15 1237 09/27/15 1258  BP:  105/70  Pulse: 80 84  Temp:    Resp: 7 26    Last Pain:  Filed Vitals:   09/27/15 1259  PainSc: Asleep                 Sebastian Ache

## 2015-09-27 NOTE — Progress Notes (Signed)
Pt sees urologist and states has no nerve control and has been catheterizing herself every night.  States wants to leave catheter in place post op and go home with it

## 2015-09-27 NOTE — Anesthesia Procedure Notes (Signed)
Procedure Name: Intubation Date/Time: 09/27/2015 10:06 AM Performed by: Fransisca Kaufmann Pre-anesthesia Checklist: Patient identified, Emergency Drugs available, Suction available, Patient being monitored and Timeout performed Patient Re-evaluated:Patient Re-evaluated prior to inductionOxygen Delivery Method: Circle system utilized Preoxygenation: Pre-oxygenation with 100% oxygen Intubation Type: IV induction Ventilation: Mask ventilation without difficulty Laryngoscope Size: Glidescope Grade View: Grade III Tube type: Oral Tube size: 6.5 mm Number of attempts: 1 Airway Equipment and Method: Stylet Placement Confirmation: ETT inserted through vocal cords under direct vision,  positive ETCO2 and breath sounds checked- equal and bilateral Secured at: 21 cm Tube secured with: Tape Dental Injury: Teeth and Oropharynx as per pre-operative assessment  Difficulty Due To: Difficulty was anticipated, Difficult Airway- due to reduced neck mobility, Difficult Airway- due to anterior larynx and Difficult Airway- due to limited oral opening

## 2015-09-27 NOTE — Transfer of Care (Signed)
Immediate Anesthesia Transfer of Care Note  Patient: [name]  Procedure(s) Performed: Procedure(s) with comments: L3-4, 4-5 Laminectomy with posterior lateral arthrodesis (N/A) - L 4-5 Laminectomy with posterior lateral arthrodesis, partial L3 laminectomy  Patient Location: PACU  Anesthesia Type:Gen Level of Consciousness: awake, alert , oriented and sedated  Airway & Oxygen Therapy: Patient Spontanous Breathing and Patient connected to nasal cannula oxygen  Post-op Assessment: Report given to RN, Post -op Vital signs reviewed and unstable, Anesthesiologist notified, Patient moving all extremities and Patient moving all extremities X 4  Post vital signs: Reviewed and stable  Last Vitals:  Filed Vitals:   09/27/15 0704 09/27/15 1157  BP: 122/74 113/67  Pulse: 77 89  Temp: 36.6 C 36 C  Resp: 18 34    Complications: No apparent anesthesia complications

## 2015-09-28 ENCOUNTER — Encounter (HOSPITAL_COMMUNITY): Payer: Self-pay | Admitting: Neurosurgery

## 2015-09-28 MED ORDER — GABAPENTIN 400 MG PO CAPS
800.0000 mg | ORAL_CAPSULE | Freq: Four times a day (QID) | ORAL | Status: DC
  Administered 2015-09-28 – 2015-09-29 (×5): 800 mg via ORAL
  Filled 2015-09-28 (×5): qty 2

## 2015-09-28 NOTE — Evaluation (Signed)
Occupational Therapy Evaluation Patient Details Name: [name] MRN: [id number] DOB: 03-20-1946 Today's Date: 09/28/2015    History of Present Illness 70 y.o. female s/p L3-4, 4-5 Laminectomy with posterior lateral arthrodesis. Hx of anxiety, constipation and GERD.   Clinical Impression   Pt admitted with above. She demonstrates the below listed deficits and will benefit from continued OT to maximize safety and independence with BADLs.  Pt presents to OT with generalized weakness, pain, decreased knowledge of precautions.  She currently requires min A for ADLs.  Will follow acutely.       Follow Up Recommendations  No OT follow up;Supervision/Assistance - 24 hour    Equipment Recommendations  3 in 1 bedside comode    Recommendations for Other Services       Precautions / Restrictions Precautions Precautions: Back Precaution Comments: reviewed back precautions with pt.  She requires min verbal cues  Required Braces or Orthoses: Spinal Brace Spinal Brace: Lumbar corset      Mobility Bed Mobility   Bed Mobility: Rolling;Sidelying to Sit;Sit to Sidelying Rolling: Min guard Sidelying to sit: Min guard     Sit to sidelying: Min guard General bed mobility comments: Pt requires cues for technique `  Transfers Overall transfer level: Needs assistance Equipment used: Rolling walker (2 wheeled) Transfers: Sit to/from [employer] Sit to Stand: Min guard Stand pivot transfers: Min guard       General transfer comment: min guard for safety and balance     Balance Overall balance assessment: Needs assistance Sitting-balance support: Feet supported Sitting balance-Leahy Scale: Good     Standing balance support: No upper extremity supported Standing balance-Leahy Scale: Fair                              ADL Overall ADL's : Needs assistance/impaired Eating/Feeding: Independent   Grooming: Wash/dry hands;Wash/dry face;Oral  care;Brushing hair;Minimal assistance;Standing   Upper Body Bathing: Supervision/ safety;Sitting   Lower Body Bathing: Minimal assistance;Sit to/from stand   Upper Body Dressing : Supervision/safety;Sitting   Lower Body Dressing: Minimal assistance;Sit to/from stand   Toilet Transfer: Minimal assistance;Ambulation;Comfort height toilet;RW;Grab bars   Toileting- Clothing Manipulation and Hygiene: Minimal assistance;Sit to/from stand       Functional mobility during ADLs: Minimal assistance;Rolling walker General ADL Comments: Pt requires min a for balance.  SHe requires min cues for precautions and safety      Vision     Perception     Praxis      Pertinent Vitals/Pain Pain Assessment: 0-10 Pain Score: 6  Pain Location: back  Pain Descriptors / Indicators: Aching Pain Intervention(s): Premedicated before session;Monitored during session     Hand Dominance Right   Extremity/Trunk Assessment Upper Extremity Assessment Upper Extremity Assessment: Overall WFL for tasks assessed   Lower Extremity Assessment Lower Extremity Assessment: Defer to PT evaluation       Communication Communication Communication: No difficulties   Cognition Arousal/Alertness: Awake/alert Behavior During Therapy: WFL for tasks assessed/performed Overall Cognitive Status: Within Functional Limits for tasks assessed                     General Comments       Exercises       Shoulder Instructions      Home Living Family/patient expects to be discharged to:: Private residence Living Arrangements: Spouse/significant other Available Help at Discharge: Family;Available 24 hours/day Type of Home: House Home Access: Stairs to enter  Entrance Stairs-Number of Steps: 1 Entrance Stairs-Rails: None Home Layout: One level     Bathroom Shower/Tub: Producer, television/film/video: Handicapped height     Home Equipment: None   Additional Comments: Spouse will be able to provide  24 hour assist until Monday of next week       Prior Functioning/Environment Level of Independence: Independent        Comments: Pt reports she has to in and out cath herself BID     OT Diagnosis: Generalized weakness;Acute pain   OT Problem List: Decreased strength;Decreased activity tolerance;Impaired balance (sitting and/or standing);Decreased safety awareness;Decreased knowledge of use of DME or AE;Decreased knowledge of precautions;Pain   OT Treatment/Interventions: Self-care/ADL training;DME and/or AE instruction;Therapeutic activities;Patient/family education;Balance training    OT Goals(Current goals can be found in the care plan section) Acute Rehab OT Goals Patient Stated Goal: to be independent  OT Goal Formulation: With patient Time For Goal Achievement: 10/05/15 Potential to Achieve Goals: Good ADL Goals Pt Will Perform Grooming: with modified independence;standing Pt Will Perform Upper Body Bathing: with modified independence;sitting Pt Will Perform Lower Body Bathing: with modified independence;sit to/from stand Pt Will Perform Upper Body Dressing: with modified independence;sitting Pt Will Perform Lower Body Dressing: with modified independence;sit to/from stand Pt Will Transfer to Toilet: with modified independence;ambulating;regular height toilet;bedside commode;grab bars Pt Will Perform Toileting - Clothing Manipulation and hygiene: with modified independence;sit to/from stand Pt Will Perform Tub/Shower Transfer: Shower transfer;ambulating;3 in 1;rolling walker  OT Frequency: Min 2X/week   Barriers to D/C:            Co-evaluation              End of Session Equipment Utilized During Treatment: Rolling walker;Gait belt;Back brace Nurse Communication: Mobility status  Activity Tolerance: Patient tolerated treatment well Patient left: in bed;with call bell/phone within reach;with bed alarm set;with family/visitor present   Time: 7829-5621 OT Time  Calculation (min): 56 min Charges:  OT General Charges $OT Visit: 1 Procedure OT Evaluation $OT Eval Moderate Complexity: 1 Procedure OT Treatments $Self Care/Home Management : 8-22 mins $Therapeutic Activity: 8-22 mins G-Codes:    Honest Safranek M Oct 09, 2015, 3:09 PM

## 2015-09-28 NOTE — Clinical Social Work Note (Signed)
CSW received referral for SNF.  Case discussed with case manager, and plan is to discharge home PT is not recommending any followup.  CSW to sign off please re-consult if social work needs arise.  Caprice Mccaffrey R. Sabina Beavers, MSW, LCSWA [phone]  

## 2015-09-28 NOTE — Care Management Note (Signed)
Case Management Note  Patient Details  Name: MUNNI CONLEY MRN: [id number] Date of Birth: August 02, 1945  Subjective/Objective:                    Action/Plan: Patient was admitted for a L3-4, 4-5 Laminectomy with posterior lateral arthrodesis.  Lives at home with spouse. Will follow for discharge needs pending PT/OT evals and physician orders.  Expected Discharge Date:   (Pending)               Expected Discharge Plan:     In-House Referral:     Discharge planning Services     Post Acute Care Choice:    Choice offered to:     DME Arranged:    DME Agency:     HH Arranged:    HH Agency:     Status of Service:  In process, will continue to follow  Medicare Important Message Given:    Date Medicare IM Given:    Medicare IM give by:    Date Additional Medicare IM Given:    Additional Medicare Important Message give by:     If discussed at Long Length of Stay Meetings, dates discussed:    Additional Comments:  Anda Kraft, RN 09/28/2015, 9:23 AM [phone]

## 2015-09-28 NOTE — Op Note (Signed)
NAMEVERLA, BALINGIT                ACCOUNT NO.:  [id number]  MEDICAL RECORD NO.:  [id number]  LOCATION:  5C06C                        FACILITY:  [facility]  PHYSICIAN:  Hilda Lias, M.D.   DATE OF BIRTH:  [birth year]  DATE OF PROCEDURE:  09/27/2015 DATE OF DISCHARGE:                              OPERATIVE REPORT   PREOPERATIVE DIAGNOSES:  Lumbar stenosis.  Bilateral radiculopathy and claudication.  POSTOPERATIVE DIAGNOSES:  Lumbar stenosis.  Bilateral radiculopathy and claudication.  PROCEDURES:  Bilateral L4-L5 laminectomies.  Partial L3 decompression of the thecal sac.  Foraminotomy to decompress the L3-4, L4-5, L5-S1. Microscope.  SURGEON:  Hilda Lias, M.D.  ASSISTANT:  Dr. Conchita Paris.  CLINICAL HISTORY:  The patient was seen in the office complaining of back pain, worsened to both legs.  No benefit from conservative treatment.  X-ray shows stenosis from l3 to l5 __________.  We talked about conservative treatment.  She wanted to proceed with surgery.  She and her husband knew the risks and benefits.  DESCRIPTION OF PROCEDURE:  The patient was taken to the OR.  After intubation, she was positioned in a prone manner.  The back was cleaned with ChloraPrep.  Midline incision was made after we applied drapes. The incision was carried out from top of 3 down to L5-S1 with retraction laterally all the way to the facet.  Then, we proceeded with removal of the spinal process of L5, L4 and partial of L3.  With the [place] and the drill, we removed the lamina bilaterally at L4-L5 and partial L3. The patient had quite a bit of thick calcified ligament worse at the level of L4-5.  Decompression was done, and we went laterally and decompressed the foramens to free __the ________ L3, L4 , L5, S1 nerve roots.  We looked at L3-4 in the left side.  There was a bulging disc, but there was no need to do the diskectomy.  The area was irrigated. Valsalva maneuver was negative.  A Hemovac was  left in to epidural space and the wound was closed with Vicryl and Steri-Strips.  The patient did well, and she is going to PACU.          ______________________________ Hilda Lias, M.D.     EB/MEDQ  D:  09/27/2015  T:  09/28/2015  Job:  [id number]

## 2015-09-28 NOTE — Evaluation (Signed)
Physical Therapy Evaluation Patient Details Name: ANABELL SWINT MRN: [id number] DOB: 01-18-1946 Today's Date: 09/28/2015   History of Present Illness  70 y.o. female s/p L3-4, 4-5 Laminectomy with posterior lateral arthrodesis. Hx of anxiety, constipation and GERD.  Clinical Impression  Pt seen following the above procedure. Pt currently with functional limitations due to the deficits listed below (see PT Problem List). Demonstrates good tolerance to gait training, lightly using a rolling walker for support. Left lateral list at baseline with gait per husband. Min assist to stand from seated position and educated on safety with mobility in/out of bed. Pt will benefit from skilled PT to increase their independence and safety with mobility to allow discharge to the venue listed below.       Follow Up Recommendations No PT follow up;Supervision for mobility/OOB    Equipment Recommendations  Rolling walker with 5" wheels ((pending progress))    Recommendations for Other Services       Precautions / Restrictions Precautions Precautions: Back Precaution Booklet Issued: No Precaution Comments: Educated for comfort Required Braces or Orthoses: Spinal Brace Spinal Brace: Lumbar corset Restrictions Weight Bearing Restrictions: No      Mobility  Bed Mobility Overal bed mobility: Needs Assistance Bed Mobility: Rolling;Sidelying to Sit Rolling: Min guard Sidelying to sit: Min guard       General bed mobility comments: Min guard for safety. VC for technique after education for log roll, performed towards Lt, without rail.  Transfers Overall transfer level: Needs assistance Equipment used: Rolling walker (2 wheeled) Transfers: Sit to/from Stand Sit to Stand: Min assist         General transfer comment: Min assist for boost to stand. VC for hand placement. Slow to rise. Practiced x2 from bed.  Ambulation/Gait Ambulation/Gait assistance: Min guard Ambulation Distance (Feet):  150 Feet Assistive device: Rolling walker (2 wheeled) Gait Pattern/deviations: Step-through pattern;Decreased stride length;Decreased stance time - left;Drifts right/left (left lateral list) Gait velocity: decreased Gait velocity interpretation: Below normal speed for age/gender General Gait Details: Educated on safe DME use with a rolling walker. Needs cues to look right at times due to drifting into objects in hallway. Very guarded but no buckling or overt loss of balance. Pre-existing left lateral list per pt which we focused on correcting during ambulation.  Stairs            Wheelchair Mobility    Modified Rankin (Stroke Patients Only)       Balance Overall balance assessment: Needs assistance Sitting-balance support: No upper extremity supported;Feet supported Sitting balance-Leahy Scale: Good     Standing balance support: No upper extremity supported Standing balance-Leahy Scale: Fair                               Pertinent Vitals/Pain Pain Assessment: Faces Faces Pain Scale:  ("It's not too bad, just sore.") Pain Location: back Pain Descriptors / Indicators: Sore Pain Intervention(s): Monitored during session;Repositioned    Home Living Family/patient expects to be discharged to:: Private residence Living Arrangements: Spouse/significant other Available Help at Discharge: Family;Available 24 hours/day Type of Home: House Home Access: Stairs to enter Entrance Stairs-Rails: None Entrance Stairs-Number of Steps: 1 Home Layout: One level Home Equipment: None      Prior Function Level of Independence: Independent               Hand Dominance   Dominant Hand: Right    Extremity/Trunk Assessment   Upper Extremity  Assessment: Defer to OT evaluation           Lower Extremity Assessment: LLE deficits/detail   LLE Deficits / Details: Lt hallux extension 4-/5, ankle dorsiflexion 4/5, hip flexion 4/5, knee extension 5/5, knee flexion  4+/5, plantar flexion 5/5      Communication   Communication: No difficulties  Cognition Arousal/Alertness: Awake/alert Behavior During Therapy: WFL for tasks assessed/performed Overall Cognitive Status: Within Functional Limits for tasks assessed                      General Comments General comments (skin integrity, edema, etc.): Husband present, not very conversant with follow-up plan of care but states he can provide assistance at home initially.  Practiced donning/doffing brace.    Exercises        Assessment/Plan    PT Assessment Patient needs continued PT services  PT Diagnosis Difficulty walking;Abnormality of gait;Acute pain   PT Problem List Decreased strength;Decreased range of motion;Decreased activity tolerance;Decreased balance;Decreased mobility;Decreased knowledge of use of DME;Decreased knowledge of precautions;Pain  PT Treatment Interventions DME instruction;Gait training;Stair training;Functional mobility training;Therapeutic activities;Therapeutic exercise;Balance training;Neuromuscular re-education;Patient/family education   PT Goals (Current goals can be found in the Care Plan section) Acute Rehab PT Goals Patient Stated Goal: Be able to pick up my dog. PT Goal Formulation: With patient Time For Goal Achievement: 10/12/15 Potential to Achieve Goals: Good    Frequency Min 5X/week   Barriers to discharge        Co-evaluation               End of Session Equipment Utilized During Treatment: Gait belt;Back brace Activity Tolerance: Patient tolerated treatment well Patient left: in chair;with call bell/phone within reach;with family/visitor present;with nursing/sitter in room Nurse Communication: Mobility status         Time: 0950-1005 PT Time Calculation (min) (ACUTE ONLY): 15 min   Charges:   PT Evaluation $PT Eval Low Complexity: 1 Procedure     PT G CodesBerton Mount 09/28/2015, 10:22 AM Charlsie Merles,  PT [phone]

## 2015-09-28 NOTE — Progress Notes (Signed)
Patient ID: [name], female   DOB: 1946-02-27, 70 y.o.   MRN: [id number] C/o incisional pain. hemovac with some drainage. No weakness. Plan dc in the next 24-48 hours. Continue pt

## 2015-09-29 NOTE — Progress Notes (Signed)
Patient ID: [name], female   DOB: Sep 17, 1945, 70 y.o.    Wants to go home. hemovac to be removed. She wants to go  Home with her foley based on her chronic urinary retention. Will be calling gu ti be sure they follow her

## 2015-09-29 NOTE — Progress Notes (Signed)
Discharged to home via wheelchair accompanied by husband.

## 2015-09-29 NOTE — Discharge Summary (Signed)
Physician Discharge Summary  Patient ID: CINTIA ALLSTON MRN: [id number] DOB/AGE: 09-26-1945 70 y.o.  Admit date: 09/27/2015 Discharge date: 09/29/2015  Admission Diagnoses:lumbar stenosis  Discharge Diagnoses:  Active Problems:   Lumbar stenosis   Discharged Condition: no pain  Hospital Course: surgery  Consults: none  Significant Diagnostic Studies:myelogram  Treatments: lumbar decompression  Discharge Exam: Blood pressure 99/86, pulse 101, temperature 98.6 F (37 C), temperature source Oral, resp. rate 18, height 5' 3.25" (1.607 m), weight 51.03 kg (112 lb 8 oz), SpO2 100 %. No weakness. Wound dry  Disposition: home     Medication List    ASK your doctor about these medications        ALPRAZolam 1 MG tablet  Commonly known as:  XANAX  Take 1 mg by mouth 3 (three) times daily as needed for anxiety. Also takes daily prn     gabapentin 800 MG tablet  Commonly known as:  NEURONTIN  Take 800 mg by mouth 4 (four) times daily.     omeprazole 20 MG capsule  Commonly known as:  PRILOSEC  Take 20 mg by mouth daily.     polyethylene glycol packet  Commonly known as:  MIRALAX / GLYCOLAX  Take 17 g by mouth 2 (two) times daily as needed.     QUEtiapine 300 MG tablet  Commonly known as:  SEROQUEL  Take 600 mg by mouth at bedtime.     sennosides-docusate sodium 8.6-50 MG tablet  Commonly known as:  SENOKOT-S  Take 4 tablets by mouth at bedtime.     topiramate 25 MG tablet  Commonly known as:  TOPAMAX  Take 25 mg by mouth at bedtime.     traZODone 100 MG tablet  Commonly known as:  DESYREL  Take 400 mg by mouth at bedtime.     verapamil 120 MG tablet  Commonly known as:  CALAN  Take 120 mg by mouth at bedtime.     zolpidem 10 MG tablet  Commonly known as:  AMBIEN  Take 5 mg by mouth at bedtime.         Signed: Karn Cassis 09/29/2015, 11:49 AM

## 2015-09-29 NOTE — Care Management Note (Signed)
Case Management Note  Patient Details  Name: [name] MRN: [id number] Date of Birth: Aug 22, 1945  Subjective/Objective:                    Action/Plan: Patient discharging home. PT/OT recommending HHPT/OT. CM spoke with Dr Jeral Fruit and he did not want HHPT/OT. Also recommendations for walker and 3 in 1. Patient states she has a walker and she does not want a 3 in 1. Dr Jeral Fruit wanted to make sure patient has an appointment for f/u with her foley cath at an urology office. Per Deedee, patients RN, the patient has a f/u appointment on March 28th at [facility].   Expected Discharge Date:   (Pending)               Expected Discharge Plan:  Home/Self Care  In-House Referral:  Clinical Social Work  Discharge planning Services     Post Acute Care Choice:    Choice offered to:     DME Arranged:    DME Agency:     HH Arranged:    HH Agency:     Status of Service:  Completed, signed off  Medicare Important Message Given:    Date Medicare IM Given:    Medicare IM give by:    Date Additional Medicare IM Given:    Additional Medicare Important Message give by:     If discussed at Long Length of Stay Meetings, dates discussed:    Additional Comments:  Kermit Balo, RN 09/29/2015, 2:16 PM

## 2015-09-29 NOTE — Progress Notes (Signed)
Physical Therapy Treatment Patient Details Name: [name] MRN: [id number] DOB: 1946/06/19 Today's Date: 09/29/2015    History of Present Illness 70 y.o. female s/p L3-4, 4-5 Laminectomy with posterior lateral arthrodesis. Hx of anxiety, constipation and GERD.    PT Comments    Pt with slow processing, repetitive statements, lack of recall of precautions, assist needed for all transfers gait and donning brace. Pt with ability to move well but currently lacks carry over of education to perform without close assist. Will continue to follow to maximize independence but recommend HHPT and additional assist at home at this time.   Follow Up Recommendations  Home health PT;Supervision for mobility/OOB     Equipment Recommendations  Rolling walker with 5" wheels    Recommendations for Other Services       Precautions / Restrictions Precautions Precautions: Back;Fall Precaution Booklet Issued: Yes (comment) Precaution Comments: reviewed all precautions with pt and spouse as she was unable to recall any of them Required Braces or Orthoses: Spinal Brace Spinal Brace: Lumbar corset;Applied in sitting position Restrictions Weight Bearing Restrictions: No    Mobility  Bed Mobility Overal bed mobility: Needs Assistance Bed Mobility: Rolling;Sidelying to Sit;Sit to Sidelying Rolling: Min guard Sidelying to sit: Min guard     Sit to sidelying: Min assist General bed mobility comments: cues for sequence and precautions with guarding to prevent twisting. assist to bring legs onto surface of bed  Transfers Overall transfer level: Needs assistance   Transfers: Sit to/from Stand Sit to Stand: Min guard         General transfer comment: cues for hand placement and safety  Ambulation/Gait Ambulation/Gait assistance: Min assist Ambulation Distance (Feet): 160 Feet Assistive device: Rolling walker (2 wheeled) Gait Pattern/deviations: Shuffle;Narrow base of support   Gait  velocity interpretation: Below normal speed for age/gender General Gait Details: pt with slow gait, short strides with veering right and left into obstacles despite cues. Max cues for posture, position in RW, directional cues and assist to steer RW for turning. pt maintains looking down and rounded shoulders despite cues, cues for increased stride length   Stairs Stairs: Yes Stairs assistance: Min guard Stair Management: Backwards;With walker Number of Stairs: 1    Wheelchair Mobility    Modified Rankin (Stroke Patients Only)       Balance                                    Cognition Arousal/Alertness: Awake/alert Behavior During Therapy: Flat affect Overall Cognitive Status: Impaired/Different from baseline Area of Impairment: Orientation;Memory;Following commands;Safety/judgement;Problem solving Orientation Level: Disoriented to;Time   Memory: Decreased short-term memory;Decreased recall of precautions Following Commands: Follows one step commands with increased time Safety/Judgement: Decreased awareness of safety;Decreased awareness of deficits   Problem Solving: Slow processing;Decreased initiation;Difficulty sequencing;Requires verbal cues General Comments: pt with slow processing, repetitive with statements, direct cues for all mobility    Exercises      General Comments        Pertinent Vitals/Pain Pain Score: 2  Pain Location: back incision Pain Descriptors / Indicators: Sore Pain Intervention(s): Limited activity within patient's tolerance;Premedicated before session;Repositioned;Monitored during session    Home Living                      Prior Function            PT Goals (current goals can now be found in  the care plan section) Progress towards PT goals: Progressing toward goals (limited by cognition)    Frequency       PT Plan Discharge plan needs to be updated    Co-evaluation             End of Session  Equipment Utilized During Treatment: Back brace Activity Tolerance: Patient tolerated treatment well Patient left: in bed;with call bell/phone within reach;with family/visitor present     Time: 1194-1740 PT Time Calculation (min) (ACUTE ONLY): 27 min  Charges:  $Gait Training: 8-22 mins $Therapeutic Activity: 8-22 mins                    G Codes:      Delorse Lek 10-12-2015, 11:56 AM Delaney Meigs, PT [phone]

## 2015-09-29 NOTE — Progress Notes (Signed)
Occupational Therapy Treatment Patient Details Name: [name] MRN: [id number] DOB: 07-23-1945 Today's Date: 09/29/2015    History of present illness 70 y.o. female s/p L3-4, 4-5 Laminectomy with posterior lateral arthrodesis. Hx of anxiety, constipation and GERD.   OT comments  Pt is very lethargic today.  She demonstrates poor attention and decreased ability to follow commands  - question due to medications.  She was unable to fully attend to ADLs today.  She will require 24 hour close supervision/assist at discharge, and recommend HHOT due to her inability to retain info and participate.   Follow Up Recommendations  Home health OT;Supervision/Assistance - 24 hour    Equipment Recommendations  3 in 1 bedside comode    Recommendations for Other Services      Precautions / Restrictions Precautions Precautions: Back;Fall Precaution Booklet Issued: Yes (comment) Precaution Comments: reviewed all precautions with pt and spouse as she was unable to recall any of them Required Braces or Orthoses: Spinal Brace Spinal Brace: Lumbar corset;Applied in sitting position       Mobility Bed Mobility Overal bed mobility: Needs Assistance Bed Mobility: Rolling;Sidelying to Sit;Sit to Sidelying Rolling: Supervision Sidelying to sit: Min guard     Sit to sidelying: Min guard General bed mobility comments: Pt able to demonstrate correct method for moving sidelying to sit, but required max cues to return to sidelying > supine   Transfers Overall transfer level: Needs assistance   Transfers: Sit to/from Stand Sit to Stand: Min guard         General transfer comment: cues for hand placement and safety    Balance Overall balance assessment: Needs assistance Sitting-balance support: Feet supported Sitting balance-Leahy Scale: Fair                             ADL Overall ADL's : Needs assistance/impaired       Grooming Details (indicate cue type and reason):  reviewed proper technique for brushing teeth and need to spit in cup.  Pt unable to recall info, but she does have back handout which has this information on it.                                General ADL Comments: Pt required mod A and max cues to don brace.   She was able to recall safe method for LB ADLs, but unable to practice due to lethargy and poor attention.         Vision                     Perception     Praxis      Cognition   Behavior During Therapy: Flat affect Overall Cognitive Status: Impaired/Different from baseline Area of Impairment: Attention;Memory;Following commands;Safety/judgement;Problem solving Orientation Level: Disoriented to;Time Current Attention Level: Sustained;Focused Memory: Decreased recall of precautions;Decreased short-term memory  Following Commands: Follows one step commands inconsistently;Follows one step commands with increased time Safety/Judgement: Decreased awareness of safety;Decreased awareness of deficits   Problem Solving: Decreased initiation;Slow processing;Difficulty sequencing;Requires verbal cues;Requires tactile cues General Comments: Anticipate deficits are due to medications     Extremity/Trunk Assessment               Exercises     Shoulder Instructions       General Comments      Pertinent Vitals/ Pain  Pain Assessment: Faces Pain Score: 2  Faces Pain Scale: Hurts a little bit Pain Location: back  Pain Descriptors / Indicators: Sore Pain Intervention(s): Monitored during session  Home Living                                          Prior Functioning/Environment              Frequency Min 2X/week     Progress Toward Goals  OT Goals(current goals can now be found in the care plan section)  Progress towards OT goals: Not progressing toward goals - comment (lethargy and poor attention )  ADL Goals Pt Will Perform Grooming: with modified  independence;standing Pt Will Perform Upper Body Bathing: with modified independence;sitting Pt Will Perform Lower Body Bathing: with modified independence;sit to/from stand Pt Will Perform Upper Body Dressing: with modified independence;sitting Pt Will Perform Lower Body Dressing: with modified independence;sit to/from stand Pt Will Transfer to Toilet: with modified independence;ambulating;regular height toilet;bedside commode;grab bars Pt Will Perform Toileting - Clothing Manipulation and hygiene: with modified independence;sit to/from stand Pt Will Perform Tub/Shower Transfer: Shower transfer;ambulating;3 in 1;rolling walker  Plan Discharge plan needs to be updated    Co-evaluation                 End of Session Equipment Utilized During Treatment: Back brace   Activity Tolerance Patient limited by lethargy   Patient Left in bed;with call bell/phone within reach;with bed alarm set   Nurse Communication Mobility status        Time: 1245-1305 OT Time Calculation (min): 20 min  Charges: OT General Charges $OT Visit: 1 Procedure OT Treatments $Self Care/Home Management : 8-22 mins  [name] M 09/29/2015, 1:24 PM

## 2015-09-30 ENCOUNTER — Encounter (HOSPITAL_COMMUNITY): Payer: Self-pay | Admitting: Emergency Medicine

## 2015-09-30 ENCOUNTER — Emergency Department (HOSPITAL_COMMUNITY): Payer: Medicare Other

## 2015-09-30 ENCOUNTER — Inpatient Hospital Stay (HOSPITAL_COMMUNITY): Payer: Medicare Other

## 2015-09-30 ENCOUNTER — Inpatient Hospital Stay (HOSPITAL_COMMUNITY)
Admission: EM | Admit: 2015-09-30 | Discharge: 2015-10-03 | DRG: 864 | Disposition: A | Discharge status: Home / Self Care | Payer: Medicare Other | Attending: Neurosurgery | Admitting: Neurosurgery

## 2015-09-30 DIAGNOSIS — G43909 Migraine, unspecified, not intractable, without status migrainosus: Secondary | ICD-10-CM | POA: Diagnosis present

## 2015-09-30 DIAGNOSIS — R404 Transient alteration of awareness: Secondary | ICD-10-CM | POA: Diagnosis not present

## 2015-09-30 DIAGNOSIS — R4182 Altered mental status, unspecified: Secondary | ICD-10-CM

## 2015-09-30 DIAGNOSIS — D649 Anemia, unspecified: Secondary | ICD-10-CM | POA: Diagnosis present

## 2015-09-30 DIAGNOSIS — K5903 Drug induced constipation: Secondary | ICD-10-CM

## 2015-09-30 DIAGNOSIS — G8929 Other chronic pain: Secondary | ICD-10-CM | POA: Diagnosis present

## 2015-09-30 DIAGNOSIS — T402X5A Adverse effect of other opioids, initial encounter: Secondary | ICD-10-CM | POA: Diagnosis present

## 2015-09-30 DIAGNOSIS — R509 Fever, unspecified: Principal | ICD-10-CM | POA: Diagnosis present

## 2015-09-30 DIAGNOSIS — G4700 Insomnia, unspecified: Secondary | ICD-10-CM | POA: Diagnosis not present

## 2015-09-30 DIAGNOSIS — R32 Unspecified urinary incontinence: Secondary | ICD-10-CM | POA: Diagnosis present

## 2015-09-30 DIAGNOSIS — F99 Mental disorder, not otherwise specified: Secondary | ICD-10-CM | POA: Diagnosis not present

## 2015-09-30 DIAGNOSIS — K219 Gastro-esophageal reflux disease without esophagitis: Secondary | ICD-10-CM | POA: Diagnosis present

## 2015-09-30 DIAGNOSIS — F419 Anxiety disorder, unspecified: Secondary | ICD-10-CM | POA: Diagnosis present

## 2015-09-30 DIAGNOSIS — R140 Abdominal distension (gaseous): Secondary | ICD-10-CM | POA: Diagnosis not present

## 2015-09-30 DIAGNOSIS — Z79899 Other long term (current) drug therapy: Secondary | ICD-10-CM

## 2015-09-30 DIAGNOSIS — R39198 Other difficulties with micturition: Secondary | ICD-10-CM | POA: Diagnosis not present

## 2015-09-30 DIAGNOSIS — R6883 Chills (without fever): Secondary | ICD-10-CM | POA: Diagnosis not present

## 2015-09-30 DIAGNOSIS — R410 Disorientation, unspecified: Secondary | ICD-10-CM | POA: Diagnosis not present

## 2015-09-30 DIAGNOSIS — K5900 Constipation, unspecified: Secondary | ICD-10-CM

## 2015-09-30 LAB — CBC WITH DIFFERENTIAL/PLATELET
BASOS ABS: 0 10*3/uL (ref 0.0–0.1)
BASOS PCT: 0 %
EOS PCT: 1 %
Eosinophils Absolute: 0.2 10*3/uL (ref 0.0–0.7)
HEMATOCRIT: 32.1 % — AB (ref 36.0–46.0)
Hemoglobin: 10.3 g/dL — ABNORMAL LOW (ref 12.0–15.0)
LYMPHS PCT: 17 %
Lymphs Abs: 2 10*3/uL (ref 0.7–4.0)
MCH: 28.9 pg (ref 26.0–34.0)
MCHC: 32.1 g/dL (ref 30.0–36.0)
MCV: 90.2 fL (ref 78.0–100.0)
Monocytes Absolute: 1.1 10*3/uL — ABNORMAL HIGH (ref 0.1–1.0)
Monocytes Relative: 9 %
NEUTROS ABS: 8.3 10*3/uL — AB (ref 1.7–7.7)
Neutrophils Relative %: 73 %
PLATELETS: 141 10*3/uL — AB (ref 150–400)
RBC: 3.56 MIL/uL — AB (ref 3.87–5.11)
RDW: 14.5 % (ref 11.5–15.5)
WBC: 11.5 10*3/uL — AB (ref 4.0–10.5)

## 2015-09-30 LAB — RAPID URINE DRUG SCREEN, HOSP PERFORMED
Amphetamines: NOT DETECTED
Barbiturates: NOT DETECTED
Benzodiazepines: POSITIVE — AB
COCAINE: NOT DETECTED
OPIATES: POSITIVE — AB
Tetrahydrocannabinol: NOT DETECTED

## 2015-09-30 LAB — COMPREHENSIVE METABOLIC PANEL
ALT: 11 U/L — AB (ref 14–54)
AST: 25 U/L (ref 15–41)
Albumin: 3.4 g/dL — ABNORMAL LOW (ref 3.5–5.0)
Alkaline Phosphatase: 77 U/L (ref 38–126)
Anion gap: 13 (ref 5–15)
BUN: 8 mg/dL (ref 6–20)
CHLORIDE: 101 mmol/L (ref 101–111)
CO2: 22 mmol/L (ref 22–32)
CREATININE: 0.79 mg/dL (ref 0.44–1.00)
Calcium: 9 mg/dL (ref 8.9–10.3)
GFR calc Af Amer: >60 mL/min (ref >60)
GFR calc non Af Amer: >60 mL/min (ref >60)
GLUCOSE: 121 mg/dL — AB (ref 65–99)
Potassium: 3.7 mmol/L (ref 3.5–5.1)
SODIUM: 136 mmol/L (ref 135–145)
Total Bilirubin: 0.6 mg/dL (ref 0.3–1.2)
Total Protein: 6.5 g/dL (ref 6.5–8.1)

## 2015-09-30 LAB — INFLUENZA PANEL BY PCR (TYPE A & B)
H1N1FLUPCR: NOT DETECTED
INFLBPCR: NEGATIVE
Influenza A By PCR: NEGATIVE

## 2015-09-30 LAB — URINALYSIS, ROUTINE W REFLEX MICROSCOPIC
BILIRUBIN URINE: NEGATIVE
Glucose, UA: NEGATIVE mg/dL
KETONES UR: NEGATIVE mg/dL
NITRITE: NEGATIVE
PROTEIN: NEGATIVE mg/dL
SPECIFIC GRAVITY, URINE: 1.011 (ref 1.005–1.030)
pH: 6 (ref 5.0–8.0)

## 2015-09-30 LAB — I-STAT VENOUS BLOOD GAS, ED
ACID-BASE DEFICIT: 1 mmol/L (ref 0.0–2.0)
Bicarbonate: 23.6 mEq/L (ref 20.0–24.0)
O2 SAT: 97 %
PO2 VEN: 98 mmHg — AB (ref 31.0–45.0)
TCO2: 25 mmol/L (ref 0–100)
pCO2, Ven: 39.7 mmHg — ABNORMAL LOW (ref 45.0–50.0)
pH, Ven: 7.382 — ABNORMAL HIGH (ref 7.250–7.300)

## 2015-09-30 LAB — PROCALCITONIN: Procalcitonin: 0.1 ng/mL

## 2015-09-30 LAB — URINE MICROSCOPIC-ADD ON

## 2015-09-30 LAB — I-STAT CG4 LACTIC ACID, ED: LACTIC ACID, VENOUS: 1.77 mmol/L (ref 0.5–2.0)

## 2015-09-30 LAB — AMMONIA: Ammonia: 35 umol/L (ref 9–35)

## 2015-09-30 LAB — I-STAT TROPONIN, ED: TROPONIN I, POC: 0 ng/mL (ref 0.00–0.08)

## 2015-09-30 MED ORDER — VANCOMYCIN HCL IN DEXTROSE 1-5 GM/200ML-% IV SOLN
1000.0000 mg | Freq: Once | INTRAVENOUS | Status: AC
Start: 2015-09-30 — End: 2015-09-30
  Administered 2015-09-30: 1000 mg via INTRAVENOUS
  Filled 2015-09-30: qty 200

## 2015-09-30 MED ORDER — PANTOPRAZOLE SODIUM 40 MG PO TBEC
40.0000 mg | DELAYED_RELEASE_TABLET | Freq: Every day | ORAL | Status: DC
Start: 2015-09-30 — End: 2015-10-03
  Administered 2015-09-30 – 2015-10-03 (×4): 40 mg via ORAL
  Filled 2015-09-30 (×4): qty 1

## 2015-09-30 MED ORDER — TRAZODONE HCL 100 MG PO TABS
400.0000 mg | ORAL_TABLET | Freq: Every day | ORAL | Status: DC
  Administered 2015-10-01 – 2015-10-02 (×3): 400 mg via ORAL
  Filled 2015-09-30 (×3): qty 4

## 2015-09-30 MED ORDER — ACETAMINOPHEN 500 MG PO TABS
1000.0000 mg | ORAL_TABLET | Freq: Once | ORAL | Status: AC
  Administered 2015-09-30: 1000 mg via ORAL
  Filled 2015-09-30: qty 2

## 2015-09-30 MED ORDER — HYDROCODONE-ACETAMINOPHEN 5-325 MG PO TABS
1.0000 | ORAL_TABLET | ORAL | Status: DC | PRN
  Administered 2015-10-01 – 2015-10-03 (×6): 2 via ORAL
  Filled 2015-09-30 (×6): qty 2

## 2015-09-30 MED ORDER — PHENOL 1.4 % MT LIQD
1.0000 | OROMUCOSAL | Status: DC | PRN
  Filled 2015-09-30: qty 177

## 2015-09-30 MED ORDER — QUETIAPINE FUMARATE 200 MG PO TABS: 600.0000 mg | ORAL_TABLET | Freq: Every day | ORAL | Status: DC

## 2015-09-30 MED ORDER — DOCUSATE SODIUM 100 MG PO CAPS
100.0000 mg | ORAL_CAPSULE | Freq: Two times a day (BID) | ORAL | Status: DC
  Administered 2015-10-01 – 2015-10-03 (×5): 100 mg via ORAL
  Filled 2015-09-30 (×5): qty 1

## 2015-09-30 MED ORDER — PIPERACILLIN-TAZOBACTAM 3.375 G IVPB
3.3750 g | Freq: Three times a day (TID) | INTRAVENOUS | Status: DC
  Administered 2015-09-30 – 2015-10-02 (×6): 3.375 g via INTRAVENOUS
  Filled 2015-09-30 (×7): qty 50

## 2015-09-30 MED ORDER — QUETIAPINE FUMARATE 50 MG PO TABS
300.0000 mg | ORAL_TABLET | Freq: Every day | ORAL | Status: DC
  Administered 2015-10-01 – 2015-10-02 (×3): 300 mg via ORAL
  Filled 2015-09-30 (×3): qty 1

## 2015-09-30 MED ORDER — ACETAMINOPHEN 325 MG PO TABS: 650.0000 mg | ORAL_TABLET | ORAL | Status: DC | PRN

## 2015-09-30 MED ORDER — ACETAMINOPHEN 325 MG PO TABS: 650.0000 mg | ORAL_TABLET | Freq: Four times a day (QID) | ORAL | Status: DC | PRN

## 2015-09-30 MED ORDER — ONDANSETRON HCL 4 MG/2ML IJ SOLN: 4.0000 mg | INTRAMUSCULAR | Status: DC | PRN

## 2015-09-30 MED ORDER — SODIUM CHLORIDE 0.9 % IV SOLN: 250.0000 mL | INTRAVENOUS | Status: DC

## 2015-09-30 MED ORDER — PIPERACILLIN-TAZOBACTAM 3.375 G IVPB 30 MIN
3.3750 g | Freq: Once | INTRAVENOUS | Status: AC
  Administered 2015-09-30: 3.375 g via INTRAVENOUS
  Filled 2015-09-30: qty 50

## 2015-09-30 MED ORDER — MENTHOL 3 MG MT LOZG
1.0000 | LOZENGE | OROMUCOSAL | Status: DC | PRN
  Filled 2015-09-30: qty 9

## 2015-09-30 MED ORDER — SODIUM CHLORIDE 0.9 % IV SOLN
INTRAVENOUS | Status: DC
  Administered 2015-10-01 – 2015-10-02 (×2): via INTRAVENOUS
  Administered 2015-10-02: 1000 mL via INTRAVENOUS
  Administered 2015-10-03: 08:00:00 via INTRAVENOUS

## 2015-09-30 MED ORDER — SODIUM CHLORIDE 0.9% FLUSH: 3.0000 mL | INTRAVENOUS | Status: DC | PRN

## 2015-09-30 MED ORDER — VERAPAMIL HCL 120 MG PO TABS
120.0000 mg | ORAL_TABLET | Freq: Every day | ORAL | Status: DC
  Administered 2015-10-01 – 2015-10-02 (×2): 120 mg via ORAL
  Filled 2015-09-30 (×4): qty 1

## 2015-09-30 MED ORDER — VANCOMYCIN HCL 500 MG IV SOLR
500.0000 mg | Freq: Two times a day (BID) | INTRAVENOUS | Status: DC
  Administered 2015-10-01 – 2015-10-02 (×4): 500 mg via INTRAVENOUS
  Filled 2015-09-30 (×5): qty 500

## 2015-09-30 MED ORDER — SODIUM CHLORIDE 0.9% FLUSH
3.0000 mL | Freq: Two times a day (BID) | INTRAVENOUS | Status: DC
  Administered 2015-10-01 – 2015-10-02 (×3): 3 mL via INTRAVENOUS

## 2015-09-30 MED ORDER — ACETAMINOPHEN 650 MG RE SUPP: 650.0000 mg | RECTAL | Status: DC | PRN

## 2015-09-30 MED ORDER — TOPIRAMATE 25 MG PO TABS
25.0000 mg | ORAL_TABLET | Freq: Every day | ORAL | Status: DC
  Administered 2015-10-01 – 2015-10-02 (×3): 25 mg via ORAL
  Filled 2015-09-30 (×3): qty 1

## 2015-09-30 MED ORDER — OXYCODONE HCL 5 MG PO TABS
5.0000 mg | ORAL_TABLET | ORAL | Status: DC | PRN
  Administered 2015-09-30 – 2015-10-01 (×4): 5 mg via ORAL
  Filled 2015-09-30 (×4): qty 1

## 2015-09-30 NOTE — ED Notes (Signed)
Paged Dr. Jeral Fruit for Nehemiah Settle, RN.

## 2015-09-30 NOTE — Progress Notes (Signed)
Pharmacy Antibiotic Note  [name] is a 70 y.o. female admitted on 09/30/2015 with sepsis.  Pharmacy has been consulted for vanc/zosyn dosing. Afeb, wbc 11.5. S/p L4-L5 laminectomy on Tues. Concern for possible meningitis vs. Encephalitis vs. UTI. Neurosurgery to see.  Plan: Zosyn 3.375g IV ( inf) x 1; then 3.375g IV q8h (4h inf) Vanc 1g IV x1; then 500mg  IV q12h Monitor clinical progress, c/s, renal function, abx plan/LOT VT@SS  as indicated   Height: 5\' 3"  (160 cm) Weight: 112 lb (50.803 kg) IBW/kg (Calculated) : 52.4  Temp (24hrs), Avg:98.6 F (37 C), Min:98.4 F (36.9 C), Max:98.7 F (37.1 C)   Recent Labs Lab 09/27/15 0654 09/30/15 0940 09/30/15 1007  WBC 5.5 11.5*  --   CREATININE 0.75 0.79  --   LATICACIDVEN  --   --  1.77    Estimated Creatinine Clearance: 51.7 mL/min (by C-G formula based on Cr of 0.79).    Allergies  Allergen Reactions  . Sulfa Antibiotics Hives    Antimicrobials this admission: 3/17 zosyn >>  3/17 vanc >>   Dose adjustments this admission: n/a  Microbiology results: 3/17 BCx:   Babs Bertin, PharmD, BCPS Clinical Pharmacist Pager [phone] 09/30/2015 10:56 AM

## 2015-09-30 NOTE — ED Notes (Signed)
Pt transported to xray 

## 2015-09-30 NOTE — ED Notes (Signed)
RN attempt to call report; RN to call back  

## 2015-09-30 NOTE — ED Provider Notes (Addendum)
CSN: [id number]     Arrival date & time 09/30/15  0848 History   First MD Initiated Contact with Patient 09/30/15 0915     Chief Complaint  Patient presents with  . post surgical problems      (Consider location/radiation/quality/duration/timing/severity/associated sxs/prior Treatment) HPI   Patient is a 70 year old female presenting with altered mental status.  Patient had bilateral L4-L5 laminectomy  and foraminotomy between L3 and S1 completed 2 days ago by Dr. Jeral Fruit.  She went home yesterday afternoon. Patient was in normal state of health when she went home. Husband reports that when she got home she started getting increasingly confused. She became increasingly confused and encephalopathic overnight. Patient was brought here by her husband this morning. No known injuries. No complaints aside from altered mental status.   He did not note any fever prior to arrival.  Patietn did leave with foley in place yesterday.  (she usually self caths, but wanted the foley)   Level V caveat  Altered mental status. Past Medical History  Diagnosis Date  . GERD (gastroesophageal reflux disease)   . Anxiety   . Chronic pain   . Constipation   . Pneumonia 2014ish  . Migraine     last one since 05/2015  . History of blood transfusion    Past Surgical History  Procedure Laterality Date  . Appendectomy    . Bladder surgery      Mesh implanted  . Shoulder surgery Left     X 2  . Tonsillectomy    . Abdominal wall mesh  removal    . Cataract extraction w/ intraocular lens  implant, bilateral Bilateral   . Kyphoplasty N/A 09/01/2014    Procedure: KYPHOPLASTY;  Surgeon: Emilee Hero, MD;  Location: [facility] OR;  Service: Orthopedics;  Laterality: N/A;  Lumbar 1 kyphoplasty  . Oophorectomy Right   . Colonoscopy    . Laminectomy with posterior lateral arthrodesis level 1 N/A 09/27/2015    Procedure: L3-4, 4-5 Laminectomy with posterior lateral arthrodesis;  Surgeon: Hilda Lias, MD;   Location: [facility] NEURO ORS;  Service: Neurosurgery;  Laterality: N/A;  L 4-5 Laminectomy with posterior lateral arthrodesis, partial L3 laminectomy   Family History  Problem Relation Age of Onset  . Coronary artery disease Father 65  . Coronary artery disease Mother 71   Social History  Substance Use Topics  . Smoking status: Never Smoker   . Smokeless tobacco: None  . Alcohol Use: No   OB History    No data available     Review of Systems  Unable to perform ROS: Mental status change  Constitutional: Positive for activity change.  Skin: Negative for rash.  Neurological: Negative for seizures, syncope and weakness.  Psychiatric/Behavioral: Positive for behavioral problems, confusion and agitation.      Allergies  Sulfa antibiotics  Home Medications   Prior to Admission medications   Medication Sig Start Date End Date Taking? Authorizing Provider  ALPRAZolam Prudy Feeler) 1 MG tablet Take 1 mg by mouth 3 (three) times daily as needed for anxiety. Also takes daily prn   Yes Historical Provider, MD  gabapentin (NEURONTIN) 800 MG tablet Take 800 mg by mouth 4 (four) times daily. 09/16/15  Yes Historical Provider, MD  omeprazole (PRILOSEC) 20 MG capsule Take 20 mg by mouth daily.    Yes Historical Provider, MD  polyethylene glycol (MIRALAX / GLYCOLAX) packet Take 17 g by mouth 2 (two) times daily as needed. Patient taking differently: Take 17 g by mouth  daily.  04/13/14  Yes Zannie Cove, MD  QUEtiapine (SEROQUEL) 300 MG tablet Take 600 mg by mouth at bedtime. 09/19/15  Yes Historical Provider, MD  sennosides-docusate sodium (SENOKOT-S) 8.6-50 MG tablet Take 4 tablets by mouth at bedtime.   Yes Historical Provider, MD  topiramate (TOPAMAX) 25 MG tablet Take 25 mg by mouth at bedtime. 08/25/15  Yes Historical Provider, MD  traZODone (DESYREL) 100 MG tablet Take 400 mg by mouth at bedtime.  01/31/13  Yes Historical Provider, MD  verapamil (CALAN) 120 MG tablet Take 120 mg by mouth at bedtime.  09/05/15  Yes Historical Provider, MD  zolpidem (AMBIEN) 10 MG tablet Take 5 mg by mouth at bedtime.    Yes Historical Provider, MD   BP 176/143 mmHg  Pulse 104  Temp(Src) 102 F (38.9 C) (Rectal)  Resp 14  Ht 5\' 3"  (1.6 m)  Wt 112 lb (50.803 kg)  BMI 19.84 kg/m2  SpO2 100% Physical Exam  Constitutional: She appears well-developed and well-nourished.  HENT:  Head: Normocephalic and atraumatic.  Eyes: Conjunctivae are normal. Right eye exhibits no discharge.  Neck: Neck supple.  Cardiovascular: Normal rate, regular rhythm and normal heart sounds.   No murmur heard. Pulmonary/Chest: Effort normal and breath sounds normal. She has no wheezes.  Abdominal: Soft. She exhibits no distension.  Musculoskeletal: Normal range of motion.  Patient has  dressing over recent scar to her back. No surrounding erythema. No purulence noted.   Neurological:  Oriented to self. Patient appears telepathic. She is repeating certain motions over and over again like the eating motion. She is repeating that she feels short of breath.    No cranial nerve deficit noted.  Skin: Skin is warm and dry. No rash noted. She is not diaphoretic.  Nursing note and vitals reviewed.   ED Course  Procedures (including critical care time) Labs Review Labs Reviewed  COMPREHENSIVE METABOLIC PANEL - Abnormal; Notable for the following:    Glucose, Bld 121 (*)    Albumin 3.4 (*)    ALT 11 (*)    All other components within normal limits  CBC WITH DIFFERENTIAL/PLATELET - Abnormal; Notable for the following:    WBC 11.5 (*)    RBC 3.56 (*)    Hemoglobin 10.3 (*)    HCT 32.1 (*)    Platelets 141 (*)    Neutro Abs 8.3 (*)    Monocytes Absolute 1.1 (*)    All other components within normal limits  URINALYSIS, ROUTINE W REFLEX MICROSCOPIC (NOT AT [facility]) - Abnormal; Notable for the following:    Hgb urine dipstick TRACE (*)    Leukocytes, UA TRACE (*)    All other components within normal limits  URINE RAPID DRUG  SCREEN, HOSP PERFORMED - Abnormal; Notable for the following:    Opiates POSITIVE (*)    Benzodiazepines POSITIVE (*)    All other components within normal limits  URINE MICROSCOPIC-ADD ON - Abnormal; Notable for the following:    Squamous Epithelial / LPF 0-5 (*)    Bacteria, UA RARE (*)    Casts HYALINE CASTS (*)    All other components within normal limits  I-STAT VENOUS BLOOD GAS, ED - Abnormal; Notable for the following:    pH, Ven 7.382 (*)    pCO2, Ven 39.7 (*)    pO2, Ven 98.0 (*)    All other components within normal limits  CULTURE, BLOOD (ROUTINE X 2)  CULTURE, BLOOD (ROUTINE X 2)  AMMONIA  I-STAT TROPOININ, ED  I-STAT CG4 LACTIC ACID, ED    Imaging Review Dg Chest 2 View  09/30/2015  CLINICAL DATA:  Confusion. Altered mental status. Initial encounter. EXAM: CHEST  2 VIEW COMPARISON:  PA and lateral chest 04/11/2014 and 04/30/2015. CT chest 04/11/2014. FINDINGS: Linear scar or atelectasis is seen in the right mid lung. The lungs are otherwise clear. Heart size is normal. No pneumothorax or pleural effusion. Visualized upper abdomen demonstrates marked gaseous distention of the imaged colon. Postoperative change of vertebral augmentation at the thoracolumbar junction is noted. IMPRESSION: No acute cardiopulmonary disease. Partial visualization of marked gaseous distention of the colon. Electronically Signed   By: Drusilla Kanner M.D.   On: 09/30/2015 10:50   Ct Head Wo Contrast  09/30/2015  CLINICAL DATA:  Recent back surgery.  New onset fever and confusion. EXAM: CT HEAD WITHOUT CONTRAST TECHNIQUE: Contiguous axial images were obtained from the base of the skull through the vertex without intravenous contrast. COMPARISON:  12/19/2013 FINDINGS: Brain parenchyma, ventricular system, and extra-axial space are within normal limits. No mass effect, midline shift, or acute hemorrhage. Cranium is intact. IMPRESSION: No acute intracranial pathology. Electronically Signed   By: Jolaine Click M.D.   On: 09/30/2015 10:30   I have personally reviewed and evaluated these images and lab results as part of my medical decision-making.   EKG Interpretation   Date/Time:  Friday September 30 2015 09:19:12 EDT Ventricular Rate:  116 PR Interval:  125 QRS Duration: 127 QT Interval:  413 QTC Calculation: 574 R Axis:   113 Text Interpretation:  Sinus tachycardia Nonspecific intraventricular  conduction delay Nonspecific repol abnormality, diffuse leads Baseline  wander in lead(s) III aVF Sinus tachycardia more tachycardic since last  tracing. Confirmed by Kandis Mannan ([id number]) on 09/30/2015 9:45:49 AM      MDM   Final diagnoses:  Altered mental status, unspecified altered mental status type   Patient is a 70 year old female status post spinal surgery 2 days ago. She is presenting here with altered mental status. Patient is presenting encephalopathic. No focality to her exam. We'll get ammonia, assess for infection. Concern today about  meningitis versus encephalitis.  Patient could be confused from UTI given foley that is place.  However the AMS is a little more than I usually expect with confusion with UTI.   Will get CAT scan, labs, blood cultures, get a rectal temperature.   CT neg, will start broad spectum abx.  No ampicillin at this point because listeria unlikely, most likely infection from surgery causing meningitiis vs  UTI as cause of AMS.   10:53 AM Disucssed with neurosurgery.  Dr. Rush Landmark to come see.   11:12 AM Bol[place]o saw patient, he will contact hosptilist for admission.   12:03 PM Urine is unimpressive as cause of AMS. Still concerned for meningitis vs infectious encephalitis.  Can't do LP given recent srugery.   Hoke Baer Randall An, MD 09/30/15 1203  Joanna Borawski Randall An, MD 09/30/15 1205

## 2015-09-30 NOTE — ED Notes (Addendum)
Attempted report they advised they had not assigned a bed yet.  They advised me they would call back.

## 2015-09-30 NOTE — Consult Note (Signed)
[facility] Medical Consultation  [name] RUE:[id number] DOB: [birth year] DOA: 09/30/2015 PCP: Arsenio Loader   Requesting physician: Jeral Fruit Date of consultation: 3/17 Reason for consultation: fever  Impression/Recommendations Active Problems:   Constipation due to opioid therapy   Altered mental status   Fever  Fever -urine clean -no sign of PNA -r/o flu -check procalcitonin -tylenol PRN Drug reaction-- high dose trazadone and seroquel-- serotonin syndrome? Vs NMS-- no clonus on exam-- if flu negative get neuro consult -no sign of meningitis- no neck stiffness but recent surgery -no sign of DVT -blood cultures pending -incentive spirometry  AMS -resolved in ER  Constipation -long term problem -miralax daily -LA ok -check xray  Polypharmacy -wean down seroquel -limit sedating meds   I will followup again tomorrow. Please contact me if I can be of assistance in the meanwhile. Thank you for this consultation.  Chief Complaint: fever, confusion  HPI:  [name] has a PMHX of anxiety, chronic pain and migraines.  She was discharged yesterday after lumbar decompression. She has a history of urinary incontinence with self catherization. She went home with a foley. This am she was febrile with AMS and brought to the ER.  She was admitted by Dr. Jeral Fruit and [facility] was consulted for fever.  Patient has improved since being in the ER.  She has been having to take her pain medications every 4 hours.  Urine was clean, chest x ray did not show PNA.   No cough, no calf pain    Review of Systems:  Unable to do ROS as patient is confused  Past Medical History  Diagnosis Date  . GERD (gastroesophageal reflux disease)   . Anxiety   . Chronic pain   . Constipation   . Pneumonia 2014ish  . Migraine     last one since 05/2015  . History of blood transfusion    Past Surgical History  Procedure Laterality Date  . Appendectomy    . Bladder surgery     Mesh implanted  . Shoulder surgery Left     X 2  . Tonsillectomy    . Abdominal wall mesh  removal    . Cataract extraction w/ intraocular lens  implant, bilateral Bilateral   . Kyphoplasty N/A 09/01/2014    Procedure: KYPHOPLASTY;  Surgeon: Emilee Hero, MD;  Location: [facility] OR;  Service: Orthopedics;  Laterality: N/A;  Lumbar 1 kyphoplasty  . Oophorectomy Right   . Colonoscopy    . Laminectomy with posterior lateral arthrodesis level 1 N/A 09/27/2015    Procedure: L3-4, 4-5 Laminectomy with posterior lateral arthrodesis;  Surgeon: Hilda Lias, MD;  Location: [facility] NEURO ORS;  Service: Neurosurgery;  Laterality: N/A;  L 4-5 Laminectomy with posterior lateral arthrodesis, partial L3 laminectomy   Social History:  reports that she has never smoked. She does not have any smokeless tobacco history on file. She reports that she does not drink alcohol or use illicit drugs.  Allergies  Allergen Reactions  . Sulfa Antibiotics Hives   Family History  Problem Relation Age of Onset  . Coronary artery disease Father 74  . Coronary artery disease Mother 31    Prior to Admission medications   Medication Sig Start Date End Date Taking? Authorizing Provider  ALPRAZolam Prudy Feeler) 1 MG tablet Take 1 mg by mouth 3 (three) times daily as needed for anxiety. Also takes daily prn   Yes Historical Provider, MD  gabapentin (NEURONTIN) 800 MG tablet Take 800 mg by mouth 4 (  four) times daily. 09/16/15  Yes Historical Provider, MD  omeprazole (PRILOSEC) 20 MG capsule Take 20 mg by mouth daily.    Yes Historical Provider, MD  polyethylene glycol (MIRALAX / GLYCOLAX) packet Take 17 g by mouth 2 (two) times daily as needed. Patient taking differently: Take 17 g by mouth daily.  04/13/14  Yes Zannie Cove, MD  QUEtiapine (SEROQUEL) 300 MG tablet Take 600 mg by mouth at bedtime. 09/19/15  Yes Historical Provider, MD  sennosides-docusate sodium (SENOKOT-S) 8.6-50 MG tablet Take 4 tablets by mouth at bedtime.   Yes  Historical Provider, MD  topiramate (TOPAMAX) 25 MG tablet Take 25 mg by mouth at bedtime. 08/25/15  Yes Historical Provider, MD  traZODone (DESYREL) 100 MG tablet Take 400 mg by mouth at bedtime.  01/31/13  Yes Historical Provider, MD  verapamil (CALAN) 120 MG tablet Take 120 mg by mouth at bedtime. 09/05/15  Yes Historical Provider, MD  zolpidem (AMBIEN) 10 MG tablet Take 5 mg by mouth at bedtime.    Yes Historical Provider, MD   Physical Exam: Blood pressure 112/79, pulse 96, temperature 102 F (38.9 C), temperature source Rectal, resp. rate 13, height 5\' 3"  (1.6 m), weight 50.803 kg (112 lb), SpO2 100 %. Filed Vitals:   09/30/15 1411 09/30/15 1415  BP: 108/87 112/79  Pulse: 93 96  Temp:    Resp: 14 13     General:  Awake, slow to respond at times, follows commands, no clonus  Eyes: normal  ENT: normal  Neck: supple, normal ROM  Cardiovascular: rrr  Respiratory: clear- min rales at bases  Abdomen: +BS, soft  Skin: no rash, wound clean  Musculoskeletal: no edema  Psychiatric: slow to respond  Neurologic: moves all 4 ext  Labs on Admission:  Basic Metabolic Panel:  Recent Labs Lab 09/27/15 0654 09/30/15 0940  NA 141 136  K 4.0 3.7  CL 110 101  CO2 22 22  GLUCOSE 90 121*  BUN 8 8  CREATININE 0.75 0.79  CALCIUM 9.6 9.0   Liver Function Tests:  Recent Labs Lab 09/30/15 0940  AST 25  ALT 11*  ALKPHOS 77  BILITOT 0.6  PROT 6.5  ALBUMIN 3.4*   No results for input(s): LIPASE, AMYLASE in the last 168 hours.  Recent Labs Lab 09/30/15 1021  AMMONIA 35   CBC:  Recent Labs Lab 09/27/15 0654 09/30/15 0940  WBC 5.5 11.5*  NEUTROABS  --  8.3*  HGB 11.8* 10.3*  HCT 37.7 32.1*  MCV 89.5 90.2  PLT 145* 141*   Cardiac Enzymes: No results for input(s): CKTOTAL, CKMB, CKMBINDEX, TROPONINI in the last 168 hours. BNP: Invalid input(s): POCBNP CBG: No results for input(s): GLUCAP in the last 168 hours.  Radiological Exams on Admission: Dg Chest 2  View  09/30/2015  CLINICAL DATA:  Confusion. Altered mental status. Initial encounter. EXAM: CHEST  2 VIEW COMPARISON:  PA and lateral chest 04/11/2014 and 04/30/2015. CT chest 04/11/2014. FINDINGS: Linear scar or atelectasis is seen in the right mid lung. The lungs are otherwise clear. Heart size is normal. No pneumothorax or pleural effusion. Visualized upper abdomen demonstrates marked gaseous distention of the imaged colon. Postoperative change of vertebral augmentation at the thoracolumbar junction is noted. IMPRESSION: No acute cardiopulmonary disease. Partial visualization of marked gaseous distention of the colon. Electronically Signed   By: Drusilla Kanner M.D.   On: 09/30/2015 10:50   Ct Head Wo Contrast  09/30/2015  CLINICAL DATA:  Recent back surgery.  New onset  fever and confusion. EXAM: CT HEAD WITHOUT CONTRAST TECHNIQUE: Contiguous axial images were obtained from the base of the skull through the vertex without intravenous contrast. COMPARISON:  12/19/2013 FINDINGS: Brain parenchyma, ventricular system, and extra-axial space are within normal limits. No mass effect, midline shift, or acute hemorrhage. Cranium is intact. IMPRESSION: No acute intracranial pathology. Electronically Signed   By: Jolaine Click M.D.   On: 09/30/2015 10:30    EKG: Independently reviewed. Sinus tachy  Time spent: 85 min  Sayvon Arterberry U [facility] Pager [phone]  If 7PM-7AM, please contact night-coverage [email or web address] Password [email or web address] 09/30/2015, 3:05 PM

## 2015-09-30 NOTE — ED Notes (Signed)
The patient is no longer assigned to 5 central.

## 2015-09-30 NOTE — ED Notes (Signed)
Attempted report and they advised they would call me back at [phone].

## 2015-09-30 NOTE — Progress Notes (Signed)
Patient arrived to floor from ED. Patient answer questions appropriately, but confused. Patient given phone, call bell system, and room information. Patient was placed on BSC and had small loose BM.

## 2015-09-30 NOTE — ED Notes (Signed)
Patient husband advised that she has L4-L5 laminectomy on Tuesday.   Patient stayed in hospital x 2 nights and was release yesterday afternoon about 330pm.   Patient started having confusion and started running fever yesterday afternoon.   Patient pale and oriented to person only at triage.   Patient had foley catheter placed during surgery and went home with same.   Patient presented to her neurologist this morning to have catheter removed and replaced and he sent her here for the confusion/fever/catheter change.   Patient had sats under 90% in triage, but hands were cold and has nail polish.   Patient NAD with breathing.  Patient presents pale and confused.

## 2015-09-30 NOTE — H&P (Signed)
[name] is an 70 y.o. female.   Chief Complaint: fever HPI: patient who was discharge yesterday after lumbar decompression. She has a history of urinary incontinence with self catherization. She went home with a foley by advise of her urologist. This am she was febril with mental changes. Saw he GU and was send to the er  Past Medical History  Diagnosis Date  . GERD (gastroesophageal reflux disease)   . Anxiety   . Chronic pain   . Constipation   . Pneumonia 2014ish  . Migraine     last one since 05/2015  . History of blood transfusion     Past Surgical History  Procedure Laterality Date  . Appendectomy    . Bladder surgery      Mesh implanted  . Shoulder surgery Left     X 2  . Tonsillectomy    . Abdominal wall mesh  removal    . Cataract extraction w/ intraocular lens  implant, bilateral Bilateral   . Kyphoplasty N/A 09/01/2014    Procedure: KYPHOPLASTY;  Surgeon: Sinclair Ship, MD;  Location: [place];  Service: Orthopedics;  Laterality: N/A;  Lumbar 1 kyphoplasty  . Oophorectomy Right   . Colonoscopy    . Laminectomy with posterior lateral arthrodesis level 1 N/A 09/27/2015    Procedure: L3-4, 4-5 Laminectomy with posterior lateral arthrodesis;  Surgeon: Leeroy Cha, MD;  Location: [facility];  Service: Neurosurgery;  Laterality: N/A;  L 4-5 Laminectomy with posterior lateral arthrodesis, partial L3 laminectomy    Family History  Problem Relation Age of Onset  . Coronary artery disease Father 34  . Coronary artery disease Mother 32   Social History:  reports that she has never smoked. She does not have any smokeless tobacco history on file. She reports that she does not drink alcohol or use illicit drugs.  Allergies:  Allergies  Allergen Reactions  . Sulfa Antibiotics Hives     (Not in a hospital admission)  Results for orders placed or performed during the hospital encounter of 09/30/15 (from the past 48 hour(s))  Comprehensive metabolic panel      Status: Abnormal   Collection Time: 09/30/15  9:40 AM  Result Value Ref Range   Sodium 136 135 - 145 mmol/L   Potassium 3.7 3.5 - 5.1 mmol/L   Chloride 101 101 - 111 mmol/L   CO2 22 22 - 32 mmol/L   Glucose, Bld 121 (H) 65 - 99 mg/dL   BUN 8 6 - 20 mg/dL   Creatinine, Ser 0.79 0.44 - 1.00 mg/dL   Calcium 9.0 8.9 - 10.3 mg/dL   Total Protein 6.5 6.5 - 8.1 g/dL   Albumin 3.4 (L) 3.5 - 5.0 g/dL   AST 25 15 - 41 U/L   ALT 11 (L) 14 - 54 U/L   Alkaline Phosphatase 77 38 - 126 U/L   Total Bilirubin 0.6 0.3 - 1.2 mg/dL   GFR calc non Af Amer >60 >60 mL/min   GFR calc Af Amer >60 >60 mL/min    Comment: (NOTE) The eGFR has been calculated using the CKD EPI equation. This calculation has not been validated in all clinical situations. eGFR's persistently <60 mL/min signify possible Chronic Kidney Disease.    Anion gap 13 5 - 15  CBC with Differential/Platelet     Status: Abnormal   Collection Time: 09/30/15  9:40 AM  Result Value Ref Range   WBC 11.5 (H) 4.0 - 10.5 K/uL   RBC 3.56 (  L) 3.87 - 5.11 MIL/uL   Hemoglobin 10.3 (L) 12.0 - 15.0 g/dL   HCT 32.1 (L) 36.0 - 46.0 %   MCV 90.2 78.0 - 100.0 fL   MCH 28.9 26.0 - 34.0 pg   MCHC 32.1 30.0 - 36.0 g/dL   RDW 14.5 11.5 - 15.5 %   Platelets 141 (L) 150 - 400 K/uL   Neutrophils Relative % 73 %   Neutro Abs 8.3 (H) 1.7 - 7.7 K/uL   Lymphocytes Relative 17 %   Lymphs Abs 2.0 0.7 - 4.0 K/uL   Monocytes Relative 9 %   Monocytes Absolute 1.1 (H) 0.1 - 1.0 K/uL   Eosinophils Relative 1 %   Eosinophils Absolute 0.2 0.0 - 0.7 K/uL   Basophils Relative 0 %   Basophils Absolute 0.0 0.0 - 0.1 K/uL  I-stat troponin, ED     Status: None   Collection Time: 09/30/15 10:05 AM  Result Value Ref Range   Troponin i, poc 0.00 0.00 - 0.08 ng/mL   Comment 3            Comment: Due to the release kinetics of cTnI, a negative result within the first hours of the onset of symptoms does not rule out myocardial infarction with certainty. If myocardial  infarction is still suspected, repeat the test at appropriate intervals.   I-Stat CG4 Lactic Acid, ED     Status: None   Collection Time: 09/30/15 10:07 AM  Result Value Ref Range   Lactic Acid, Venous 1.77 0.5 - 2.0 mmol/L  I-Stat venous blood gas, ED     Status: Abnormal   Collection Time: 09/30/15 10:07 AM  Result Value Ref Range   pH, Ven 7.382 (H) 7.250 - 7.300   pCO2, Ven 39.7 (L) 45.0 - 50.0 mmHg   pO2, Ven 98.0 (H) 31.0 - 45.0 mmHg   Bicarbonate 23.6 20.0 - 24.0 mEq/L   TCO2 25 0 - 100 mmol/L   O2 Saturation 97.0 %   Acid-base deficit 1.0 0.0 - 2.0 mmol/L   Patient temperature HIDE    Sample type VENOUS   Ammonia     Status: None   Collection Time: 09/30/15 10:21 AM  Result Value Ref Range   Ammonia 35 9 - 35 umol/L  Urinalysis, Routine w reflex microscopic (not at [facility])     Status: Abnormal   Collection Time: 09/30/15 10:21 AM  Result Value Ref Range   Color, Urine YELLOW YELLOW   APPearance CLEAR CLEAR   Specific Gravity, Urine 1.011 1.005 - 1.030   pH 6.0 5.0 - 8.0   Glucose, UA NEGATIVE NEGATIVE mg/dL   Hgb urine dipstick TRACE (A) NEGATIVE   Bilirubin Urine NEGATIVE NEGATIVE   Ketones, ur NEGATIVE NEGATIVE mg/dL   Protein, ur NEGATIVE NEGATIVE mg/dL   Nitrite NEGATIVE NEGATIVE   Leukocytes, UA TRACE (A) NEGATIVE  Urine microscopic-add on     Status: Abnormal   Collection Time: 09/30/15 10:21 AM  Result Value Ref Range   Squamous Epithelial / LPF 0-5 (A) NONE SEEN   WBC, UA 0-5 0 - 5 WBC/hpf   RBC / HPF 0-5 0 - 5 RBC/hpf   Bacteria, UA RARE (A) NONE SEEN   Casts HYALINE CASTS (A) NEGATIVE   Urine-Other MUCOUS PRESENT   Urine rapid drug screen (hosp performed)     Status: Abnormal   Collection Time: 09/30/15 10:31 AM  Result Value Ref Range   Opiates POSITIVE (A) NONE DETECTED   Cocaine NONE DETECTED NONE  DETECTED   Benzodiazepines POSITIVE (A) NONE DETECTED   Amphetamines NONE DETECTED NONE DETECTED   Tetrahydrocannabinol NONE DETECTED NONE DETECTED    Barbiturates NONE DETECTED NONE DETECTED    Comment:        DRUG SCREEN FOR MEDICAL PURPOSES ONLY.  IF CONFIRMATION IS NEEDED FOR ANY PURPOSE, NOTIFY LAB WITHIN 5 DAYS.        LOWEST DETECTABLE LIMITS FOR URINE DRUG SCREEN Drug Class       Cutoff (ng/mL) Amphetamine      1000 Barbiturate      200 Benzodiazepine   567 Tricyclics       014 Opiates          300 Cocaine          300 THC              50    Dg Chest 2 View  09/30/2015  CLINICAL DATA:  Confusion. Altered mental status. Initial encounter. EXAM: CHEST  2 VIEW COMPARISON:  PA and lateral chest 04/11/2014 and 04/30/2015. CT chest 04/11/2014. FINDINGS: Linear scar or atelectasis is seen in the right mid lung. The lungs are otherwise clear. Heart size is normal. No pneumothorax or pleural effusion. Visualized upper abdomen demonstrates marked gaseous distention of the imaged colon. Postoperative change of vertebral augmentation at the thoracolumbar junction is noted. IMPRESSION: No acute cardiopulmonary disease. Partial visualization of marked gaseous distention of the colon. Electronically Signed   By: Inge Rise M.D.   On: 09/30/2015 10:50   Ct Head Wo Contrast  09/30/2015  CLINICAL DATA:  Recent back surgery.  New onset fever and confusion. EXAM: CT HEAD WITHOUT CONTRAST TECHNIQUE: Contiguous axial images were obtained from the base of the skull through the vertex without intravenous contrast. COMPARISON:  12/19/2013 FINDINGS: Brain parenchyma, ventricular system, and extra-axial space are within normal limits. No mass effect, midline shift, or acute hemorrhage. Cranium is intact. IMPRESSION: No acute intracranial pathology. Electronically Signed   By: Marybelle Killings M.D.   On: 09/30/2015 10:30    Review of Systems  Constitutional: Negative for fever and chills.  HENT: Negative.   Eyes: Negative.   Respiratory: Negative.   Cardiovascular: Negative.   Gastrointestinal: Negative.   Genitourinary:       Foley in place   Musculoskeletal: Negative.   Skin: Negative.   Neurological: Negative.   Endo/Heme/Allergies: Negative.   Psychiatric/Behavioral: Negative for hallucinations.    Blood pressure 122/62, pulse 100, temperature 102 F (38.9 C), temperature source Rectal, resp. rate 13, height '5\' 3"'  (1.6 m), weight 50.803 kg (112 lb), SpO2 100 %. Physical Exam  Hent, nl. Neck NO stiffness,. Cv, nl. Lungs minimal rales. Abdomen, nl. Extremities no evidence of cellulitis or dtv/ neuro, oriente x 2. No weakness. Sensory nl. Lumbar wound ,no evidence of infection Assessment/Plan Admission for fever w/u  Floyce Stakes, MD 09/30/2015, 12:53 PM

## 2015-09-30 NOTE — ED Notes (Signed)
Hospitalist at bedside 

## 2015-09-30 NOTE — ED Notes (Signed)
RN 2nd attempt to call report; RN to call back

## 2015-10-01 DIAGNOSIS — R509 Fever, unspecified: Principal | ICD-10-CM

## 2015-10-01 LAB — URINE CULTURE: Culture: 2000

## 2015-10-01 NOTE — Progress Notes (Signed)
[facility] PROGRESS NOTE  [name] BTY:[id number] DOB: 1945/11/15 DOA: 09/30/2015 PCP: Gus Rankin, PA-C  Assessment/Plan:  Active Problems:    Fever - Source unknown  - continue to follow urine and blood cultures - would continue antibiotics for now. These can be discontinued in the near future should cultures remain negative. - No myoclonus on exam - reassess cbc next am.  Lumbar decompression - Neurosurgery on board and recent operation not felt to be source of fever.    Code Status: full Family Communication: d/c patient directly Disposition Plan: Pending further work up   Procedures:  Lumbar decompression  Antibiotics:  Vancomycin and Zosyn  HPI/Subjective: Pt has no new complaints. No acute issues overnight. She would like to go home soon.  Objective: Filed Vitals:   10/01/15 1029 10/01/15 1409  BP: 108/68 127/72  Pulse: 100 87  Temp: 98.3 F (36.8 C) 97.5 F (36.4 C)  Resp:      Intake/Output Summary (Last 24 hours) at 10/01/15 1410 Last data filed at 10/01/15 0348  Gross per 24 hour  Intake     50 ml  Output   1625 ml  Net  -1575 ml   Filed Weights   09/30/15 0852 09/30/15 2230  Weight: 50.803 kg (112 lb) 52.9 kg (116 lb 10 oz)    Exam:   General:  Pt in nad, alert and awake  Cardiovascular: rrr, no mrg  Respiratory: cta bl, no wheezes  Abdomen: soft, nd  Musculoskeletal: no cyanosis or clubbing on limited exam   Data Reviewed: Basic Metabolic Panel:  Recent Labs Lab 09/27/15 0654 09/30/15 0940  NA 141 136  K 4.0 3.7  CL 110 101  CO2 22 22  GLUCOSE 90 121*  BUN 8 8  CREATININE 0.75 0.79  CALCIUM 9.6 9.0   Liver Function Tests:  Recent Labs Lab 09/30/15 0940  AST 25  ALT 11*  ALKPHOS 77  BILITOT 0.6  PROT 6.5  ALBUMIN 3.4*   No results for input(s): LIPASE, AMYLASE in the last 168 hours.  Recent Labs Lab 09/30/15 1021  AMMONIA 35   CBC:  Recent Labs Lab 09/27/15 0654 09/30/15 0940   WBC 5.5 11.5*  NEUTROABS  --  8.3*  HGB 11.8* 10.3*  HCT 37.7 32.1*  MCV 89.5 90.2  PLT 145* 141*   Cardiac Enzymes: No results for input(s): CKTOTAL, CKMB, CKMBINDEX, TROPONINI in the last 168 hours. BNP (last 3 results) No results for input(s): BNP in the last 8760 hours.  ProBNP (last 3 results) No results for input(s): PROBNP in the last 8760 hours.  CBG: No results for input(s): GLUCAP in the last 168 hours.  Recent Results (from the past 240 hour(s))  Surgical pcr screen     Status: None   Collection Time: 09/27/15  7:22 AM  Result Value Ref Range Status   MRSA, PCR NEGATIVE NEGATIVE Final   Staphylococcus aureus NEGATIVE NEGATIVE Final    Comment:        The Xpert SA Assay (FDA approved for NASAL specimens in patients over 13 years of age), is one component of a comprehensive surveillance program.  Test performance has been validated by [facility] for patients greater than or equal to 35 year old. It is not intended to diagnose infection nor to guide or monitor treatment.   Culture, Urine     Status: None   Collection Time: 09/30/15 10:21 AM  Result Value Ref Range Status   Specimen Description URINE, CATHETERIZED  Final   Special Requests NONE  Final   Culture 2,000 COLONIES/mL INSIGNIFICANT GROWTH  Final   Report Status 10/01/2015 FINAL  Final     Studies: Dg Chest 2 View  09/30/2015  CLINICAL DATA:  Confusion. Altered mental status. Initial encounter. EXAM: CHEST  2 VIEW COMPARISON:  PA and lateral chest 04/11/2014 and 04/30/2015. CT chest 04/11/2014. FINDINGS: Linear scar or atelectasis is seen in the right mid lung. The lungs are otherwise clear. Heart size is normal. No pneumothorax or pleural effusion. Visualized upper abdomen demonstrates marked gaseous distention of the imaged colon. Postoperative change of vertebral augmentation at the thoracolumbar junction is noted. IMPRESSION: No acute cardiopulmonary disease. Partial visualization of marked  gaseous distention of the colon. Electronically Signed   By: Drusilla Kanner M.D.   On: 09/30/2015 10:50   Ct Head Wo Contrast  09/30/2015  CLINICAL DATA:  Recent back surgery.  New onset fever and confusion. EXAM: CT HEAD WITHOUT CONTRAST TECHNIQUE: Contiguous axial images were obtained from the base of the skull through the vertex without intravenous contrast. COMPARISON:  12/19/2013 FINDINGS: Brain parenchyma, ventricular system, and extra-axial space are within normal limits. No mass effect, midline shift, or acute hemorrhage. Cranium is intact. IMPRESSION: No acute intracranial pathology. Electronically Signed   By: Jolaine Click M.D.   On: 09/30/2015 10:30   Dg Abd Portable 1v  09/30/2015  CLINICAL DATA:  Fever with abdominal distention. Recent lumbar surgery EXAM: PORTABLE ABDOMEN - 1 VIEW COMPARISON:  April 13, 2014 FINDINGS: There is generalized bowel dilatation in a pattern suggesting ileus. No air-fluid levels are identified. No free air is evident on this supine examination. Postoperative changes noted in the lumbar spine at L4 and L5. The patient has had a kyphoplasty procedure at L1. IMPRESSION: Bowel gas pattern most suggestive of a degree of ileus. Obstruction possible but felt to be less likely. No free air is evident on this supine examination. Electronically Signed   By: Bretta Bang III M.D.   On: 09/30/2015 15:09    Scheduled Meds: . docusate sodium  100 mg Oral BID  . pantoprazole  40 mg Oral Daily  . piperacillin-tazobactam (ZOSYN)  IV  3.375 g Intravenous Q8H  . QUEtiapine  300 mg Oral QHS  . sodium chloride flush  3 mL Intravenous Q12H  . topiramate  25 mg Oral QHS  . traZODone  400 mg Oral QHS  . vancomycin  500 mg Intravenous Q12H  . verapamil  120 mg Oral QHS   Continuous Infusions: . sodium chloride    . sodium chloride 75 mL/hr at 10/01/15 0053    Time spent: > 35 minutes  Penny Pia  [facility] Pager [phone]. If 7PM-7AM, please contact  night-coverage at [email or web address], password [facility] 10/01/2015, 2:10 PM  LOS: 1 day

## 2015-10-01 NOTE — Progress Notes (Signed)
Improving mental status No acute events MAXIMUM TEMPERATURE 102 early yesterday Vital signs stable Awake, alert, oriented Follows commands throughout Incision looks good Stable No evidence of lumbar wound infection No new neurosurgical recommendations

## 2015-10-01 NOTE — Progress Notes (Signed)
Occupational Therapy Evaluation Patient Details Name: [name] MRN: [id number] DOB: [birth year] Today's Date: 10/01/2015    History of Present Illness Pt is a 70 y.o. female admitted with fever and AMS. She underwent L4-3, 4-5 laminectomies 09-27-15 and was discharged home 09-29-15.   Clinical Impression   PTA, pt was independent with ADLs and mobility. Pt currently presents with poor safety awareness, decreased ability to follow commands, memory deficits, and increased confusion. Pt required min guard-min assist for functional transfers and bed mobility with mod verbal cues for back precautions. Pt will benefit from continued acute OT to increase independence and safety with ADLs and mobility to allow for safe discharge home. Recommend HHOT upon discharge.    Follow Up Recommendations  Home health OT;Supervision/Assistance - 24 hour    Equipment Recommendations  3 in 1 bedside comode    Recommendations for Other Services       Precautions / Restrictions Precautions Precautions: Back;Fall Precaution Booklet Issued: No Precaution Comments: Pt unable to recall any back precautions at beginning of session. Reviewed precautions. Pt unable to recall any precautions without verbal cues. Required Braces or Orthoses: Spinal Brace Spinal Brace: Lumbar corset;Applied in sitting position (no brace in room) Restrictions Weight Bearing Restrictions: No Other Position/Activity Restrictions: 10-01-15: MD order for 'no brace needed'      Mobility Bed Mobility Overal bed mobility: Needs Assistance Bed Mobility: Sidelying to Sit;Rolling;Sit to Sidelying Rolling: Min guard Sidelying to sit: Min guard     Sit to sidelying: Min guard General bed mobility comments: Min guard assist for safety due to pt's increased confusion. Verbal cues for log roll technique - pt with adequate return demonstration  Transfers Overall transfer level: Needs assistance Equipment used: Rolling walker (2  wheeled) Transfers: Sit to/from [employer] Sit to Stand: Min guard Stand pivot transfers: Min guard       General transfer comment: Min guard for safety due to pt's increased confusion. Verbal cues for safety and hand placement.    Balance Overall balance assessment: Needs assistance Sitting-balance support: No upper extremity supported;Feet supported Sitting balance-Leahy Scale: Good     Standing balance support: Bilateral upper extremity supported;During functional activity Standing balance-Leahy Scale: Fair                              ADL Overall ADL's : Needs assistance/impaired                         Toilet Transfer: Min guard;Cueing for safety;Ambulation;RW Toilet Transfer Details (indicate cue type and reason): simulated to chair, cues for safe hand placement         Functional mobility during ADLs: Min guard;Rolling walker General ADL Comments: Began educationon back precautions, but pt with increased confusion and decreased short-term memory and was unable to retain any information.     Vision Vision Assessment?: No apparent visual deficits   Perception     Praxis      Pertinent Vitals/Pain Pain Assessment: Faces Faces Pain Scale: Hurts little more Pain Location: back Pain Descriptors / Indicators: Sore Pain Intervention(s): Monitored during session;Limited activity within patient's tolerance;Repositioned     Hand Dominance Right   Extremity/Trunk Assessment Upper Extremity Assessment Upper Extremity Assessment: Generalized weakness   Lower Extremity Assessment Lower Extremity Assessment: Generalized weakness   Cervical / Trunk Assessment Cervical / Trunk Assessment: Kyphotic   Communication Communication Communication: No difficulties   Cognition Arousal/Alertness:  Awake/alert Behavior During Therapy: Flat affect Overall Cognitive Status: Impaired/Different from baseline Area of Impairment:  Attention;Memory;Following commands;Safety/judgement;Awareness;Problem solving;Orientation Orientation Level: Disoriented to;Time Current Attention Level: Sustained Memory: Decreased recall of precautions;Decreased short-term memory Following Commands: Follows one step commands inconsistently;Follows one step commands with increased time Safety/Judgement: Decreased awareness of safety;Decreased awareness of deficits Awareness: Emergent Problem Solving: Decreased initiation;Slow processing;Difficulty sequencing;Requires verbal cues;Requires tactile cues General Comments: Pt perseverating on calling her husband and did so x2 with assist from therapist. Pt continued to call out husband's name despite being told that he was not here and had gone home.   General Comments       Exercises       Shoulder Instructions      Home Living Family/patient expects to be discharged to:: Private residence Living Arrangements: Spouse/significant other Available Help at Discharge: Family;Available 24 hours/day Type of Home: House Home Access: Stairs to enter [employer] of Steps: 1 Entrance Stairs-Rails: None Home Layout: One level     Bathroom Shower/Tub: Producer, television/film/video: Handicapped height     Home Equipment: Environmental consultant - 2 wheels   Additional Comments: Spouse will be able to provide 24 hour assist until Monday - per chart from recent previous admission      Prior Functioning/Environment Level of Independence: Independent        Comments: All PLOF and home information obtained from recent previous admission.    OT Diagnosis: Generalized weakness;Acute pain   OT Problem List: Decreased strength;Decreased range of motion;Decreased activity tolerance;Impaired balance (sitting and/or standing);Decreased coordination;Decreased cognition;Decreased safety awareness;Decreased knowledge of use of DME or AE;Decreased knowledge of precautions;Pain   OT  Treatment/Interventions: Self-care/ADL training;DME and/or AE instruction;Therapeutic activities;Patient/family education;Balance training    OT Goals(Current goals can be found in the care plan section) Acute Rehab OT Goals Patient Stated Goal: none stated OT Goal Formulation: Patient unable to participate in goal setting Time For Goal Achievement: 10/15/15 Potential to Achieve Goals: Good ADL Goals Pt Will Perform Grooming: with supervision;standing Pt Will Perform Lower Body Bathing: with supervision;with adaptive equipment;sit to/from stand Pt Will Perform Lower Body Dressing: with supervision;with adaptive equipment;sit to/from stand Pt Will Transfer to Toilet: with supervision;ambulating;bedside commode (with BSC over toilet) Pt Will Perform Toileting - Clothing Manipulation and hygiene: with supervision;with adaptive equipment;sit to/from stand;sitting/lateral leans Pt Will Perform Tub/Shower Transfer: Shower transfer;with supervision;ambulating;3 in 1;rolling walker Additional ADL Goal #1: Pt will independently don/doff back brace to increase independence with ADLs and mobility. Additional ADL Goal #2: Pt will demonstrate adherence to 3/3 back precautions to increase safety with ADLs and mobility.  OT Frequency: Min 2X/week   Barriers to D/C: Decreased caregiver support  Unable to speak with pt's husband but previous admission stated he could provide 24/7 assistance       Co-evaluation              End of Session Equipment Utilized During Treatment: Gait belt;Rolling walker Nurse Communication: Mobility status  Activity Tolerance: Treatment limited secondary to medical complications (Comment);Patient limited by pain (confusion) Patient left: in bed;with call bell/phone within reach;with bed alarm set;with SCD's reapplied   Time: 1610-9604 OT Time Calculation (min): 21 min Charges:  OT General Charges $OT Visit: 1 Procedure OT Evaluation $OT Eval Moderate Complexity:  1 Procedure G-Codes:    Nils Pyle, OTR/L Pager: [phone] 10/01/2015, 4:50 PM

## 2015-10-01 NOTE — Evaluation (Signed)
Physical Therapy Evaluation Patient Details Name: [name] MRN: [id number] DOB: 08-Dec-1945 Today's Date: 10/01/2015   History of Present Illness  Pt is a 69 y.o. female admitted with fever and AMS. She underwent L4-3, 4-5 laminectomies 09-27-15 and was discharged home 09-29-15.  Clinical Impression  Pt admitted with above diagnosis. Pt currently with functional limitations due to the deficits listed below (see PT Problem List). On eval, pt required min assist bed mobility, min guard assist sit to stand, and min assist ambulation with RW. Pt demo poor safety awareness, decreased ability to follow commands, and confusion. Pt will benefit from skilled PT to increase their independence and safety with mobility to allow discharge to the venue listed below.       Follow Up Recommendations Home health PT;Supervision for mobility/OOB    Equipment Recommendations  None recommended by PT    Recommendations for Other Services       Precautions / Restrictions Precautions Precautions: Back;Fall Precaution Comments: Reviewed 3/3 back precautions. Pt unable to recall. Restrictions Other Position/Activity Restrictions: 10-01-15: MD order for 'no brace needed'      Mobility  Bed Mobility Overal bed mobility: Needs Assistance Bed Mobility: Rolling;Sidelying to Sit Rolling: Min assist Sidelying to sit: Min assist       General bed mobility comments: cues for back precautions/logroll  Transfers Overall transfer level: Needs assistance Equipment used: Rolling walker (2 wheeled) Transfers: Sit to/from [employer] Sit to Stand: Min guard Stand pivot transfers: Min guard       General transfer comment: cues for hand placement and safety  Ambulation/Gait Ambulation/Gait assistance: Min assist Ambulation Distance (Feet): 15 Feet Assistive device: Rolling walker (2 wheeled) Gait Pattern/deviations: Step-through pattern;Decreased stride length;Narrow base of support Gait  velocity: decreased Gait velocity interpretation: Below normal speed for age/gender General Gait Details: Assist with RW management of obstacles.  Stairs            Wheelchair Mobility    Modified Rankin (Stroke Patients Only)       Balance                                             Pertinent Vitals/Pain Pain Assessment: Faces Faces Pain Scale: Hurts a little bit Pain Location: back Pain Descriptors / Indicators: Sore Pain Intervention(s): Monitored during session    Home Living Family/patient expects to be discharged to:: Private residence Living Arrangements: Spouse/significant other Available Help at Discharge: Family;Available 24 hours/day Type of Home: House Home Access: Stairs to enter Entrance Stairs-Rails: None Entrance Stairs-Number of Steps: 1 Home Layout: One level Home Equipment: Walker - 2 wheels Additional Comments: Spouse will be able to provide 24 hour assist until Monday.    Prior Function Level of Independence: Independent         Comments: Pt reports she has to in and out cath herself BID      Hand Dominance   Dominant Hand: Right    Extremity/Trunk Assessment   Upper Extremity Assessment: Overall WFL for tasks assessed           Lower Extremity Assessment: Generalized weakness         Communication   Communication: No difficulties  Cognition Arousal/Alertness: Lethargic Behavior During Therapy: Flat affect Overall Cognitive Status: Impaired/Different from baseline Area of Impairment: Attention;Memory;Following commands;Safety/judgement;Problem solving Orientation Level: Disoriented to;Time Current Attention Level: Sustained Memory: Decreased recall of  precautions;Decreased short-term memory Following Commands: Follows one step commands inconsistently;Follows one step commands with increased time Safety/Judgement: Decreased awareness of safety;Decreased awareness of deficits   Problem Solving:  Decreased initiation;Slow processing;Difficulty sequencing;Requires verbal cues;Requires tactile cues General Comments: Anticipate deficits are due to medications.    General Comments      Exercises        Assessment/Plan    PT Assessment Patient needs continued PT services  PT Diagnosis Difficulty walking;Altered mental status;Acute pain   PT Problem List Decreased strength;Decreased range of motion;Decreased activity tolerance;Decreased balance;Decreased mobility;Decreased knowledge of use of DME;Decreased knowledge of precautions;Pain;Decreased safety awareness;Decreased cognition  PT Treatment Interventions DME instruction;Gait training;Stair training;Functional mobility training;Therapeutic activities;Therapeutic exercise;Balance training;Neuromuscular re-education;Patient/family education   PT Goals (Current goals can be found in the Care Plan section) Acute Rehab PT Goals Patient Stated Goal: not stated PT Goal Formulation: With patient/family Time For Goal Achievement: 10/15/15 Potential to Achieve Goals: Good    Frequency Min 5X/week   Barriers to discharge        Co-evaluation               End of Session Equipment Utilized During Treatment: Gait belt Activity Tolerance: Patient tolerated treatment well Patient left: in chair;with call bell/phone within reach;with chair alarm set;with family/visitor present Nurse Communication: Mobility status         Time: 1409-1440 PT Time Calculation (min) (ACUTE ONLY): 31 min   Charges:   PT Evaluation $PT Eval Moderate Complexity: 1 Procedure PT Treatments $Therapeutic Activity: 8-22 mins   PT G Codes:        Ilda Foil 10/01/2015, 3:02 PM

## 2015-10-02 LAB — CBC
HEMATOCRIT: 24 % — AB (ref 36.0–46.0)
HEMOGLOBIN: 8 g/dL — AB (ref 12.0–15.0)
MCH: 29.1 pg (ref 26.0–34.0)
MCHC: 33.3 g/dL (ref 30.0–36.0)
MCV: 87.3 fL (ref 78.0–100.0)
Platelets: 147 10*3/uL — ABNORMAL LOW (ref 150–400)
RBC: 2.75 MIL/uL — AB (ref 3.87–5.11)
RDW: 14.6 % (ref 11.5–15.5)
WBC: 3.9 10*3/uL — ABNORMAL LOW (ref 4.0–10.5)

## 2015-10-02 LAB — PROCALCITONIN: Procalcitonin: <0.1 ng/mL

## 2015-10-02 MED ORDER — FERROUS SULFATE 325 (65 FE) MG PO TABS
325.0000 mg | ORAL_TABLET | Freq: Two times a day (BID) | ORAL | Status: DC
  Administered 2015-10-02: 325 mg via ORAL
  Filled 2015-10-02 (×3): qty 1

## 2015-10-02 NOTE — Progress Notes (Signed)
Physical Therapy Treatment Patient Details Name: [name] MRN: [id number] DOB: Dec 15, 1945 Today's Date: 10/02/2015    History of Present Illness Pt is a 70 y.o. female admitted with fever and AMS. She underwent L4-3, 4-5 laminectomies 09-27-15 and was discharged home 09-29-15.    PT Comments    Pt needed less overall assistance this session for increased activity, mostly needing cues for safety with mobility. Pt continues to be confused which impacts her safety with mobility, limiting her progress toward goals. Acute PT to continue during pt's hospital stay.   Follow Up Recommendations  Home health PT;Supervision for mobility/OOB     Equipment Recommendations  None recommended by PT       Precautions / Restrictions Precautions Precautions: Back;Fall Precaution Comments: reviewed precautions with pt. and spouse Required Braces or Orthoses: Spinal Brace Spinal Brace: Lumbar corset;Applied in sitting position Restrictions Other Position/Activity Restrictions: 10-01-15: MD order for 'no brace needed', 10/02/15: brace is in room, need clarification on if brace is to be worn or not. did not use during PT session due to newer MD orders on 10/01/15 from surgical orders    Mobility  Bed Mobility Overal bed mobility: Needs Assistance Bed Mobility: Rolling;Sidelying to Sit Rolling: Min guard Sidelying to sit: Min guard     Sit to sidelying: Min guard General bed mobility comments: HOB flat and no rails. cues on sequencing and technique needed, no physical assistance needed this session  Transfers Overall transfer level: Needs assistance Equipment used: Rolling walker (2 wheeled) Transfers: Sit to/from Stand Sit to Stand: Min guard Stand pivot transfers: Min guard       General transfer comment: cues on hand placement for safety with transfers  Ambulation/Gait Ambulation/Gait assistance: Min guard Ambulation Distance (Feet): 100 Feet Assistive device: Rolling walker (2  wheeled) Gait Pattern/deviations: Step-through pattern;Decreased stride length;Narrow base of support Gait velocity: decreased Gait velocity interpretation: Below normal speed for age/gender General Gait Details: moderate cues for RW management, however pt able to correct path to avoid walls/obstacles with just cues, no physical assistance needed today. cues on posture, walker position with gait and for increased/equal step length as well.       Cognition Arousal/Alertness: Lethargic Behavior During Therapy: Flat affect Overall Cognitive Status: Impaired/Different from baseline Area of Impairment: Attention;Following commands;Safety/judgement;Awareness;Problem solving;Memory Orientation Level: Disoriented to;Time Current Attention Level: Sustained Memory: Decreased recall of precautions;Decreased short-term memory Following Commands: Follows one step commands inconsistently;Follows one step commands with increased time Safety/Judgement: Decreased awareness of safety;Decreased awareness of deficits Awareness: Emergent Problem Solving: Decreased initiation;Slow processing;Difficulty sequencing;Requires verbal cues;Requires tactile cues General Comments: Pt repeatedly asking her husband if "he will be home with her tomorrow?" during session and perserverating on being tired and wanting to nap. Pt was able to state the day (sunday) and her location (hospital) during this session.     Pertinent Vitals/Pain Pain Assessment: No/denies pain Pain Score: 0-No pain Pain Location: reports soreness in back, no pain Pain Descriptors / Indicators: Tender Pain Intervention(s): Limited activity within patient's tolerance;Monitored during session;Repositioned     PT Goals (current goals can now be found in the care plan section) Acute Rehab PT Goals Patient Stated Goal: none stated PT Goal Formulation: With patient/family Time For Goal Achievement: 10/15/15 Potential to Achieve Goals:  Good Progress towards PT goals: Progressing toward goals    Frequency  Min 5X/week    PT Plan Current plan remains appropriate    End of Session Equipment Utilized During Treatment: Gait belt Activity Tolerance: Patient tolerated  treatment well Patient left: in bed;with call bell/phone within reach;with bed alarm set;with family/visitor present     Time: 8280-0349 PT Time Calculation (min) (ACUTE ONLY): 19 min  Charges:  $Gait Training: 8-22 mins                    Sallyanne Kuster 10/02/2015, 11:00 AM   Sallyanne Kuster, PTA, CLT Acute Rehab Services [phone] 10/02/2015, 11:03 AM

## 2015-10-02 NOTE — Progress Notes (Signed)
Occupational Therapy Treatment Patient Details Name: [name] MRN: [id number] DOB: 27-Jul-1945 Today's Date: 10/02/2015    History of present illness Pt is a 70 y.o. female admitted with fever and AMS. She underwent L4-3, 4-5 laminectomies 09-27-15 and was discharged home 09-29-15.   OT comments  Pt. Moving well physically at min guard A, but greatest limitation remains confusion.  Unable to consistently incorporate back precautions into functional mobility without verbal and tactile guidance.  Husband present for session and reports he is able to assist at home with LB ADLS.  Will continue to follow acutely.    Follow Up Recommendations  Home health OT;Supervision/Assistance - 24 hour    Equipment Recommendations  3 in 1 bedside comode    Recommendations for Other Services      Precautions / Restrictions Precautions Precautions: Back;Fall Precaution Comments: reviewed precautions with pt. and spouse Required Braces or Orthoses: Spinal Brace Spinal Brace: Lumbar corset;Applied in sitting position Restrictions Other Position/Activity Restrictions: 10-01-15: MD order for 'no brace needed', brace in room so had pt. don and wear during tx. session        Mobility Bed Mobility Overal bed mobility: Needs Assistance Bed Mobility: Rolling;Sidelying to Sit Rolling: Min guard Sidelying to sit: Min guard       General bed mobility comments: HOB flat, no rails, exits from R side at home.  initiated log roll without cues, initial min guard a to guide trunk upright but pt. required very little physical assist  Transfers Overall transfer level: Needs assistance Equipment used: Rolling walker (2 wheeled) Transfers: Sit to/from [employer] Sit to Stand: Min guard Stand pivot transfers: Min guard       General transfer comment: Min guard for safety due to pt's increased confusion. Verbal cues for safety and hand placement.    Balance                                    ADL Overall ADL's : Needs assistance/impaired               Lower Body Bathing Details (indicate cue type and reason): spouse present and reports no A/E needs, he will assist with all LB ADLS Upper Body Dressing : Maximal assistance;Sitting Upper Body Dressing Details (indicate cue type and reason): max a to don brace, spouse present and educated on how to don, states "she knew how to do this before"   Lower Body Dressing Details (indicate cue type and reason): spouse present and reports no A/E needs, he will assist with all LB ADLS Toilet Transfer: Min guard;Cueing for safety;Ambulation;RW Toilet Transfer Details (indicate cue type and reason): simulated from bed, around the bed to the chair Toileting- Clothing Manipulation and Hygiene: Minimal assistance;Sit to/from stand Toileting - Clothing Manipulation Details (indicate cue type and reason): simulated during session     Functional mobility during ADLs: Min guard;Rolling walker General ADL Comments: reviewed back precautions with pt. and spouse, pt. remains with increased confusion, asking multiple times where she was, and had no recolection of having back sx.       Vision                     Perception     Praxis      Cognition   Behavior During Therapy: Flat affect Overall Cognitive Status: Impaired/Different from baseline Area of Impairment: Attention;Memory;Following commands;Safety/judgement;Awareness;Problem solving;Orientation   Current Attention  Level: Sustained Memory: Decreased recall of precautions;Decreased short-term memory  Following Commands: Follows one step commands inconsistently;Follows one step commands with increased time   Awareness: Emergent Problem Solving: Decreased initiation;Slow processing;Difficulty sequencing;Requires verbal cues;Requires tactile cues      Extremity/Trunk Assessment               Exercises     Shoulder Instructions       General  Comments      Pertinent Vitals/ Pain       Pain Assessment: No/denies pain Pain Intervention(s): Premedicated before session;Repositioned  Home Living                                          Prior Functioning/Environment              Frequency Min 2X/week     Progress Toward Goals  OT Goals(current goals can now be found in the care plan section)  Progress towards OT goals: Progressing toward goals     Plan Discharge plan remains appropriate    Co-evaluation                 End of Session Equipment Utilized During Treatment: Gait belt;Rolling walker;Back brace   Activity Tolerance Patient tolerated treatment well   Patient Left in chair;with call bell/phone within reach;with chair alarm set;with family/visitor present   Nurse Communication          Time: 4401-0272 OT Time Calculation (min): 20 min  Charges: OT General Charges $OT Visit: 1 Procedure OT Treatments $Self Care/Home Management : 8-22 mins  Robet Leu, COTA/L 10/02/2015, 9:51 AM

## 2015-10-02 NOTE — Progress Notes (Signed)
[facility] PROGRESS NOTE  SIMARA RHYNER ZOX:[id number] DOB: 1945-12-11 DOA: 09/30/2015 PCP: Gus Rankin, PA-C  Assessment/Plan:  Active Problems:    Fever - Source unknown  - continue to follow urine and blood cultures (remain negative x 2 days) - Will monitor off antibiotics. If no fevers in 24 hour period off antibiotics, may discharge from my standpoint. - No myoclonus on exam - reassess cbc next am.  Lumbar decompression - Neurosurgery on board and recent operation not felt to be source of fever.    Code Status: full Family Communication: d/c patient directly Disposition Plan: Pending further work up   Procedures:  Lumbar decompression  Antibiotics:  Vancomycin and Zosyn>>>3/19  HPI/Subjective: Pt has no new complaints. Looking forward to going home soon  Objective: Filed Vitals:   10/02/15 0528 10/02/15 1015  BP: 118/61 120/68  Pulse: 106 85  Temp: 99.2 F (37.3 C) 98.8 F (37.1 C)  Resp: 16 18    Intake/Output Summary (Last 24 hours) at 10/02/15 1632 Last data filed at 10/02/15 1400  Gross per 24 hour  Intake 3026.75 ml  Output   2300 ml  Net 726.75 ml   Filed Weights   09/30/15 0852 09/30/15 2230  Weight: 50.803 kg (112 lb) 52.9 kg (116 lb 10 oz)    Exam:   General:  Pt in nad, alert and awake  Cardiovascular: rrr, no mrg  Respiratory: cta bl, no wheezes  Abdomen: soft, nd  Musculoskeletal: no cyanosis or clubbing on limited exam   Data Reviewed: Basic Metabolic Panel:  Recent Labs Lab 09/27/15 0654 09/30/15 0940  NA 141 136  K 4.0 3.7  CL 110 101  CO2 22 22  GLUCOSE 90 121*  BUN 8 8  CREATININE 0.75 0.79  CALCIUM 9.6 9.0   Liver Function Tests:  Recent Labs Lab 09/30/15 0940  AST 25  ALT 11*  ALKPHOS 77  BILITOT 0.6  PROT 6.5  ALBUMIN 3.4*   No results for input(s): LIPASE, AMYLASE in the last 168 hours.  Recent Labs Lab 09/30/15 1021  AMMONIA 35   CBC:  Recent Labs Lab 09/27/15 0654  09/30/15 0940 10/02/15 0520  WBC 5.5 11.5* 3.9*  NEUTROABS  --  8.3*  --   HGB 11.8* 10.3* 8.0*  HCT 37.7 32.1* 24.0*  MCV 89.5 90.2 87.3  PLT 145* 141* 147*   Cardiac Enzymes: No results for input(s): CKTOTAL, CKMB, CKMBINDEX, TROPONINI in the last 168 hours. BNP (last 3 results) No results for input(s): BNP in the last 8760 hours.  ProBNP (last 3 results) No results for input(s): PROBNP in the last 8760 hours.  CBG: No results for input(s): GLUCAP in the last 168 hours.  Recent Results (from the past 240 hour(s))  Surgical pcr screen     Status: None   Collection Time: 09/27/15  7:22 AM  Result Value Ref Range Status   MRSA, PCR NEGATIVE NEGATIVE Final   Staphylococcus aureus NEGATIVE NEGATIVE Final    Comment:        The Xpert SA Assay (FDA approved for NASAL specimens in patients over 66 years of age), is one component of a comprehensive surveillance program.  Test performance has been validated by [facility] for patients greater than or equal to 26 year old. It is not intended to diagnose infection nor to guide or monitor treatment.   Blood culture (routine x 2)     Status: None (Preliminary result)   Collection Time: 09/30/15  9:30 AM  Result Value Ref Range Status   Specimen Description BLOOD RIGHT WRIST  Final   Special Requests BOTTLES DRAWN AEROBIC AND ANAEROBIC 5CC  Final   Culture NO GROWTH 2 DAYS  Final   Report Status PENDING  Incomplete  Blood culture (routine x 2)     Status: None (Preliminary result)   Collection Time: 09/30/15  9:40 AM  Result Value Ref Range Status   Specimen Description BLOOD LEFT WRIST  Final   Special Requests   Final    BOTTLES DRAWN AEROBIC AND ANAEROBIC 3CC ANA 5CC AER   Culture NO GROWTH 2 DAYS  Final   Report Status PENDING  Incomplete  Culture, Urine     Status: None   Collection Time: 09/30/15 10:21 AM  Result Value Ref Range Status   Specimen Description URINE, CATHETERIZED  Final   Special Requests NONE  Final    Culture 2,000 COLONIES/mL INSIGNIFICANT GROWTH  Final   Report Status 10/01/2015 FINAL  Final     Studies: No results found.  Scheduled Meds: . docusate sodium  100 mg Oral BID  . ferrous sulfate  325 mg Oral BID WC  . pantoprazole  40 mg Oral Daily  . QUEtiapine  300 mg Oral QHS  . sodium chloride flush  3 mL Intravenous Q12H  . topiramate  25 mg Oral QHS  . traZODone  400 mg Oral QHS  . verapamil  120 mg Oral QHS   Continuous Infusions: . sodium chloride    . sodium chloride 1,000 mL (10/02/15 0159)    Time spent: > 35 minutes  Penny Pia  [facility] Pager [phone]. If 7PM-7AM, please contact night-coverage at [email or web address], password [facility] 10/02/2015, 4:32 PM  LOS: 2 days

## 2015-10-02 NOTE — Progress Notes (Signed)
PT notes reviewed.  PT recommending home with HHPT.  No other CSW needs identified, CSW signing off.  Please re-consult as necessary.  Pollyann Savoy, LCSW Weekend Coverage [phone]

## 2015-10-02 NOTE — Progress Notes (Signed)
Patient ID: [name], female   DOB: August 04, 1945, 70 y.o.   MRN: [id number] Subjective:  The patient is somnolent but easily arousable. She answers questions appropriately. She remains a bit confused and thinks she is at home.  Objective: Vital signs in last 24 hours: Temp:  [97.5 F (36.4 C)-99.2 F (37.3 C)] 99.2 F (37.3 C) (03/19 0528) Pulse Rate:  [87-106] 106 (03/19 0528) Resp:  [16] 16 (03/19 0528) BP: (101-127)/(55-72) 118/61 mmHg (03/19 0528) SpO2:  [92 %-100 %] 92 % (03/19 0528)  Intake/Output from previous day: 03/18 0701 - 03/19 0700 In: 3 [I.V.:3] Out: 2300 [Urine:2300] Intake/Output this shift:    Physical exam the patient is somnolent but arousable. Her wound is healing well. She is moving her lower extremities well. She is confused.  Lab Results:  Recent Labs  09/30/15 0940 10/02/15 0520  WBC 11.5* 3.9*  HGB 10.3* 8.0*  HCT 32.1* 24.0*  PLT 141* 147*   BMET  Recent Labs  09/30/15 0940  NA 136  K 3.7  CL 101  CO2 22  GLUCOSE 121*  BUN 8  CREATININE 0.79  CALCIUM 9.0    Studies/Results: Dg Chest 2 View  09/30/2015  CLINICAL DATA:  Confusion. Altered mental status. Initial encounter. EXAM: CHEST  2 VIEW COMPARISON:  PA and lateral chest 04/11/2014 and 04/30/2015. CT chest 04/11/2014. FINDINGS: Linear scar or atelectasis is seen in the right mid lung. The lungs are otherwise clear. Heart size is normal. No pneumothorax or pleural effusion. Visualized upper abdomen demonstrates marked gaseous distention of the imaged colon. Postoperative change of vertebral augmentation at the thoracolumbar junction is noted. IMPRESSION: No acute cardiopulmonary disease. Partial visualization of marked gaseous distention of the colon. Electronically Signed   By: Drusilla Kanner M.D.   On: 09/30/2015 10:50   Ct Head Wo Contrast  09/30/2015  CLINICAL DATA:  Recent back surgery.  New onset fever and confusion. EXAM: CT HEAD WITHOUT CONTRAST TECHNIQUE: Contiguous axial  images were obtained from the base of the skull through the vertex without intravenous contrast. COMPARISON:  12/19/2013 FINDINGS: Brain parenchyma, ventricular system, and extra-axial space are within normal limits. No mass effect, midline shift, or acute hemorrhage. Cranium is intact. IMPRESSION: No acute intracranial pathology. Electronically Signed   By: Jolaine Click M.D.   On: 09/30/2015 10:30   Dg Abd Portable 1v  09/30/2015  CLINICAL DATA:  Fever with abdominal distention. Recent lumbar surgery EXAM: PORTABLE ABDOMEN - 1 VIEW COMPARISON:  April 13, 2014 FINDINGS: There is generalized bowel dilatation in a pattern suggesting ileus. No air-fluid levels are identified. No free air is evident on this supine examination. Postoperative changes noted in the lumbar spine at L4 and L5. The patient has had a kyphoplasty procedure at L1. IMPRESSION: Bowel gas pattern most suggestive of a degree of ileus. Obstruction possible but felt to be less likely. No free air is evident on this supine examination. Electronically Signed   By: Bretta Bang III M.D.   On: 09/30/2015 15:09    Assessment/Plan: Postop day #5: The patient's wound is healing well.  Anemia: I will add iron.  Confusion: I suspect this is multifactorial. She doesn't seem like she is having much pain so I will discontinue the oxycodone.  I have spoken with the patient's husband.  LOS: 2 days     Christean Silvestri D 10/02/2015, 9:23 AM

## 2015-10-03 NOTE — Progress Notes (Signed)
[facility] PROGRESS NOTE  BRINSLEY WENCE ZOX:[id number] DOB: April 07, 1946 DOA: 09/30/2015 PCP: Gus Rankin, PA-C  Assessment/Plan:  Active Problems:    Fever - Blood cultures negative x 2 days - May have been secondary to recent medications related to anesthesia - Pt doing well off of antibiotics, no fevers within the last 24 hours - Ok to d/c from my standpoint.  Lumbar decompression - Follow up recommendations per primary team    Code Status: full Family Communication: d/c patient directly Disposition Plan: f/u with neurosurgery   Procedures:  Lumbar decompression  Antibiotics:  Vancomycin and Zosyn>>>3/19  HPI/Subjective: No acute issues reported to me overnight.  Objective: Filed Vitals:   10/03/15 0700 10/03/15 0958  BP: 112/65 115/66  Pulse: 95 96  Temp: 97.9 F (36.6 C) 98.4 F (36.9 C)  Resp: 16 20    Intake/Output Summary (Last 24 hours) at 10/03/15 1233 Last data filed at 10/03/15 0700  Gross per 24 hour  Intake    990 ml  Output   2100 ml  Net  -1110 ml   Filed Weights   09/30/15 0852 09/30/15 2230  Weight: 50.803 kg (112 lb) 52.9 kg (116 lb 10 oz)    Exam:   General:  Pt in nad, alert and awake  Cardiovascular: rrr, no mrg  Respiratory: cta bl, no wheezes  Abdomen: soft, nd  Musculoskeletal: no cyanosis or clubbing on limited exam   Data Reviewed: Basic Metabolic Panel:  Recent Labs Lab 09/27/15 0654 09/30/15 0940  NA 141 136  K 4.0 3.7  CL 110 101  CO2 22 22  GLUCOSE 90 121*  BUN 8 8  CREATININE 0.75 0.79  CALCIUM 9.6 9.0   Liver Function Tests:  Recent Labs Lab 09/30/15 0940  AST 25  ALT 11*  ALKPHOS 77  BILITOT 0.6  PROT 6.5  ALBUMIN 3.4*   No results for input(s): LIPASE, AMYLASE in the last 168 hours.  Recent Labs Lab 09/30/15 1021  AMMONIA 35   CBC:  Recent Labs Lab 09/27/15 0654 09/30/15 0940 10/02/15 0520  WBC 5.5 11.5* 3.9*  NEUTROABS  --  8.3*  --   HGB 11.8* 10.3* 8.0*   HCT 37.7 32.1* 24.0*  MCV 89.5 90.2 87.3  PLT 145* 141* 147*   Cardiac Enzymes: No results for input(s): CKTOTAL, CKMB, CKMBINDEX, TROPONINI in the last 168 hours. BNP (last 3 results) No results for input(s): BNP in the last 8760 hours.  ProBNP (last 3 results) No results for input(s): PROBNP in the last 8760 hours.  CBG: No results for input(s): GLUCAP in the last 168 hours.  Recent Results (from the past 240 hour(s))  Surgical pcr screen     Status: None   Collection Time: 09/27/15  7:22 AM  Result Value Ref Range Status   MRSA, PCR NEGATIVE NEGATIVE Final   Staphylococcus aureus NEGATIVE NEGATIVE Final    Comment:        The Xpert SA Assay (FDA approved for NASAL specimens in patients over 94 years of age), is one component of a comprehensive surveillance program.  Test performance has been validated by [facility] for patients greater than or equal to 69 year old. It is not intended to diagnose infection nor to guide or monitor treatment.   Blood culture (routine x 2)     Status: None (Preliminary result)   Collection Time: 09/30/15  9:30 AM  Result Value Ref Range Status   Specimen Description BLOOD RIGHT WRIST  Final  Special Requests BOTTLES DRAWN AEROBIC AND ANAEROBIC 5CC  Final   Culture NO GROWTH 2 DAYS  Final   Report Status PENDING  Incomplete  Blood culture (routine x 2)     Status: None (Preliminary result)   Collection Time: 09/30/15  9:40 AM  Result Value Ref Range Status   Specimen Description BLOOD LEFT WRIST  Final   Special Requests   Final    BOTTLES DRAWN AEROBIC AND ANAEROBIC 3CC ANA 5CC AER   Culture NO GROWTH 2 DAYS  Final   Report Status PENDING  Incomplete  Culture, Urine     Status: None   Collection Time: 09/30/15 10:21 AM  Result Value Ref Range Status   Specimen Description URINE, CATHETERIZED  Final   Special Requests NONE  Final   Culture 2,000 COLONIES/mL INSIGNIFICANT GROWTH  Final   Report Status 10/01/2015 FINAL  Final      Studies: No results found.  Scheduled Meds: . docusate sodium  100 mg Oral BID  . ferrous sulfate  325 mg Oral BID WC  . pantoprazole  40 mg Oral Daily  . QUEtiapine  300 mg Oral QHS  . sodium chloride flush  3 mL Intravenous Q12H  . topiramate  25 mg Oral QHS  . traZODone  400 mg Oral QHS  . verapamil  120 mg Oral QHS   Continuous Infusions: . sodium chloride    . sodium chloride 75 mL/hr at 10/03/15 3875    Time spent: > 35 minutes  Penny Pia  [facility] Pager [phone]. If 7PM-7AM, please contact night-coverage at [email or web address], password [facility] 10/03/2015, 12:33 PM  LOS: 3 days

## 2015-10-03 NOTE — Progress Notes (Signed)
Patient ID: [name], female   DOB: Sep 21, 1945, 70 y.o.   MRN: [id number] No fever,mentally better. Wound clear. No source of fever. Spoke with husband, dc today or in am

## 2015-10-03 NOTE — Progress Notes (Signed)
Pt discharging home with husband. Discharge instructions given. IV and telemetry removed. Pt has prescription for pain medication remaining from recent admission and discharge following back surgery. Pt left unit via wheelchair with NT at 1716. Lawson Radar

## 2015-10-03 NOTE — Care Management Important Message (Signed)
Important Message  Patient Details  Name: [name] MRN: [id number] Date of Birth: November 07, 1945   Medicare Important Message Given:  Yes    Oralia Rud Annis Lagoy 10/03/2015, 3:53 PM

## 2015-10-03 NOTE — Progress Notes (Signed)
Physical Therapy Treatment Patient Details Name: [name] MRN: [id number] DOB: [birth year] Today's Date: 10/03/2015    History of Present Illness Pt is a 70 y.o. female admitted with fever and AMS. She underwent L4-3, 4-5 laminectomies 09-27-15 and was discharged home 09-29-15.    PT Comments    Patient continues to be easily distracted with poor memory for back precautions and how to safely use RW.   Follow Up Recommendations  Home health PT;Supervision/Assistance - 24 hour (24/7 due to AMS)     Equipment Recommendations  None recommended by PT    Recommendations for Other Services       Precautions / Restrictions Precautions Precautions: Back;Fall Precaution Comments: pt able to state 2 of 3 back precautions (not arching) Required Braces or Orthoses: Spinal Brace Spinal Brace: Lumbar corset;Applied in sitting position Restrictions Weight Bearing Restrictions: No    Mobility  Bed Mobility Overal bed mobility: Modified Independent Bed Mobility: Rolling;Sidelying to Sit Rolling: Supervision Sidelying to sit: Supervision       General bed mobility comments: HOB flat, +rail with light use; no cues needed  Transfers Overall transfer level: Needs assistance Equipment used: Rolling walker (2 wheeled) Transfers: Sit to/from Stand Sit to Stand: Min assist         General transfer comment: vc for safe use of RW both sit and stand; on second attempt required tactile cues to attend to task and complete correctly  Ambulation/Gait Ambulation/Gait assistance: Min guard Ambulation Distance (Feet): 150 Feet Assistive device: Rolling walker (2 wheeled) Gait Pattern/deviations: Step-through pattern;Decreased stride length   Gait velocity interpretation: Below normal speed for age/gender General Gait Details: vc for proper use of RW and upright posture (pt would maintain for up to 3 minutes and then require repeat cuing)   Stairs            Wheelchair Mobility     Modified Rankin (Stroke Patients Only)       Balance           Standing balance support: No upper extremity supported Standing balance-Leahy Scale: Fair                      Cognition Arousal/Alertness: Awake/alert Behavior During Therapy: Anxious Overall Cognitive Status: Impaired/Different from baseline Area of Impairment: Attention;Awareness;Problem solving   Current Attention Level: Sustained Memory: Decreased recall of precautions;Decreased short-term memory       Problem Solving: Slow processing;Difficulty sequencing General Comments: not recalling back precautions; repeatedly requesting the same items be put within reach (when already completed & in reach)    Exercises      General Comments        Pertinent Vitals/Pain Pain Assessment: No/denies pain    Home Living                      Prior Function            PT Goals (current goals can now be found in the care plan section) Acute Rehab PT Goals Patient Stated Goal: be able to pick up my 10 lb dog Time For Goal Achievement: 10/15/15 Progress towards PT goals: Progressing toward goals    Frequency  Min 3X/week (beyond acute phase of back surgery; AMS 1* diagnosis)    PT Plan Frequency needs to be updated    Co-evaluation             End of Session Equipment Utilized During Treatment: Gait belt Activity Tolerance: Patient tolerated  treatment well Patient left: in chair;with call bell/phone within reach;with chair alarm set     Time: 1141-1210 PT Time Calculation (min) (ACUTE ONLY): 29 min  Charges:  $Gait Training: 8-22 mins $Therapeutic Activity: 8-22 mins                    G Codes:      Keldric Poyer October 26, 2015, 12:44 PM  Pager [phone]

## 2015-10-03 NOTE — Care Management Note (Signed)
Case Management Note  Patient Details  Name: [name] MRN: [id number] Date of Birth: January 27, 1946  Subjective/Objective:                    Action/Plan: Patient was admitted with altered mental status, fever. Lives at home with spouse. Will follow for discharge needs pending PT/OT evals and physician orders.  Expected Discharge Date:                  Expected Discharge Plan:     In-House Referral:     Discharge planning Services     Post Acute Care Choice:    Choice offered to:     DME Arranged:    DME Agency:     HH Arranged:    HH Agency:     Status of Service:  In process, will continue to follow  Medicare Important Message Given:    Date Medicare IM Given:    Medicare IM give by:    Date Additional Medicare IM Given:    Additional Medicare Important Message give by:     If discussed at Long Length of Stay Meetings, dates discussed:    Additional CommentsAnda Kraft, RN 10/03/2015, 12:04 PM [phone]

## 2015-10-03 NOTE — Discharge Instructions (Signed)
Wound Care Leave incision open to air. You may shower. Do not scrub directly on incision.  Do not put any creams, lotions, or ointments on incision. Activity Walk each and every day, increasing distance each day. No lifting greater than 5 lbs.  Avoid bending, arching, and twisting. No driving for 2 weeks; may ride as a passenger locally. If provided with back brace, wear when out of bed.  It is not necessary to wear in bed. Diet Resume your normal diet.  Return to Work Will be discussed at you follow up appointment. Call Your Doctor If Any of These Occur Redness, drainage, or swelling at the wound.  Temperature greater than 101 degrees. Severe pain not relieved by pain medication. Incision starts to come apart. Follow Up Appt Call today for appointment in 3 weeks ([phone]) or for problems.

## 2015-10-03 NOTE — Discharge Summary (Signed)
Physician Discharge Summary  Patient ID: [name] MRN: [id number] DOB/AGE: Jan 11, 1946 70 y.o.  Admit date: 09/30/2015 Discharge date: 10/03/2015  Admission Diagnoses:s/p lumbar stenosis   FUO  Discharge Diagnoses:  Active Problems:   Polypharmacy   Constipation due to opioid therapy   Altered mental status   Fever   Insomnia   Fever and chills   Discharged Condition: STABLE  Hospital Course:observation  Consults: hospitalist  Significant Diagnostic Studies: blood cultures  Treatments: observation  Discharge Exam: Blood pressure 115/66, pulse 96, temperature 98.4 F (36.9 C), temperature source Oral, resp. rate 20, height 5\' 4"  (1.626 m), weight 52.9 kg (116 lb 10 oz), SpO2 99 %. Alert oriented. No fever  Disposition:home with her husband     Medication List    ASK your doctor about these medications        ALPRAZolam 1 MG tablet  Commonly known as:  XANAX  Take 1 mg by mouth 3 (three) times daily as needed for anxiety. Also takes daily prn     gabapentin 800 MG tablet  Commonly known as:  NEURONTIN  Take 800 mg by mouth 4 (four) times daily.     omeprazole 20 MG capsule  Commonly known as:  PRILOSEC  Take 20 mg by mouth daily.     polyethylene glycol packet  Commonly known as:  MIRALAX / GLYCOLAX  Take 17 g by mouth 2 (two) times daily as needed.     QUEtiapine 300 MG tablet  Commonly known as:  SEROQUEL  Take 600 mg by mouth at bedtime.     sennosides-docusate sodium 8.6-50 MG tablet  Commonly known as:  SENOKOT-S  Take 4 tablets by mouth at bedtime.     topiramate 25 MG tablet  Commonly known as:  TOPAMAX  Take 25 mg by mouth at bedtime.     traZODone 100 MG tablet  Commonly known as:  DESYREL  Take 400 mg by mouth at bedtime.     verapamil 120 MG tablet  Commonly known as:  CALAN  Take 120 mg by mouth at bedtime.     zolpidem 10 MG tablet  Commonly known as:  AMBIEN  Take 5 mg by mouth at bedtime.          Signed: Karn Cassis 10/03/2015, 12:55 PM

## 2015-10-03 NOTE — Care Management Note (Signed)
Case Management Note  Patient Details  Name: [name] MRN: [id number] Date of Birth: [birth year]  Subjective/Objective:                    Action/Plan: Patient discharging home today with self care. PT/OT recommending HH but Dr Jeral Fruit did not order. OT recommending 3 in 1. Patient states she does not want the 3 in 1. Bedside RN updated.   Expected Discharge Date:                  Expected Discharge Plan:  Home/Self Care  In-House Referral:     Discharge planning Services  CM Consult  Post Acute Care Choice:    Choice offered to:     DME Arranged:    DME Agency:     HH Arranged:    HH Agency:     Status of Service:  Completed, signed off  Medicare Important Message Given:    Date Medicare IM Given:    Medicare IM give by:    Date Additional Medicare IM Given:    Additional Medicare Important Message give by:     If discussed at Long Length of Stay Meetings, dates discussed:    Additional Comments:  Kermit Balo, RN 10/03/2015, 1:25 PM

## 2015-10-05 LAB — CULTURE, BLOOD (ROUTINE X 2)
CULTURE: NO GROWTH
Culture: NO GROWTH

## 2015-10-06 DIAGNOSIS — R338 Other retention of urine: Secondary | ICD-10-CM | POA: Diagnosis not present

## 2015-12-05 DIAGNOSIS — G43009 Migraine without aura, not intractable, without status migrainosus: Secondary | ICD-10-CM | POA: Diagnosis not present

## 2015-12-19 DIAGNOSIS — M4806 Spinal stenosis, lumbar region: Secondary | ICD-10-CM | POA: Diagnosis not present

## 2015-12-20 DIAGNOSIS — M8588 Other specified disorders of bone density and structure, other site: Secondary | ICD-10-CM | POA: Diagnosis not present

## 2015-12-20 DIAGNOSIS — Z780 Asymptomatic menopausal state: Secondary | ICD-10-CM | POA: Diagnosis not present

## 2015-12-21 DIAGNOSIS — R293 Abnormal posture: Secondary | ICD-10-CM | POA: Diagnosis not present

## 2015-12-21 DIAGNOSIS — M5126 Other intervertebral disc displacement, lumbar region: Secondary | ICD-10-CM | POA: Diagnosis not present

## 2015-12-21 DIAGNOSIS — M5127 Other intervertebral disc displacement, lumbosacral region: Secondary | ICD-10-CM | POA: Diagnosis not present

## 2015-12-21 DIAGNOSIS — M545 Low back pain: Secondary | ICD-10-CM | POA: Diagnosis not present

## 2015-12-26 DIAGNOSIS — M5441 Lumbago with sciatica, right side: Secondary | ICD-10-CM | POA: Diagnosis not present

## 2015-12-26 DIAGNOSIS — K219 Gastro-esophageal reflux disease without esophagitis: Secondary | ICD-10-CM | POA: Diagnosis not present

## 2015-12-26 DIAGNOSIS — Z23 Encounter for immunization: Secondary | ICD-10-CM | POA: Diagnosis not present

## 2015-12-26 DIAGNOSIS — K581 Irritable bowel syndrome with constipation: Secondary | ICD-10-CM | POA: Diagnosis not present

## 2015-12-26 DIAGNOSIS — M8580 Other specified disorders of bone density and structure, unspecified site: Secondary | ICD-10-CM | POA: Diagnosis not present

## 2015-12-26 DIAGNOSIS — G8929 Other chronic pain: Secondary | ICD-10-CM | POA: Diagnosis not present

## 2015-12-26 DIAGNOSIS — R5382 Chronic fatigue, unspecified: Secondary | ICD-10-CM | POA: Diagnosis not present

## 2015-12-28 DIAGNOSIS — R293 Abnormal posture: Secondary | ICD-10-CM | POA: Diagnosis not present

## 2015-12-28 DIAGNOSIS — M5126 Other intervertebral disc displacement, lumbar region: Secondary | ICD-10-CM | POA: Diagnosis not present

## 2015-12-28 DIAGNOSIS — M5127 Other intervertebral disc displacement, lumbosacral region: Secondary | ICD-10-CM | POA: Diagnosis not present

## 2015-12-28 DIAGNOSIS — M545 Low back pain: Secondary | ICD-10-CM | POA: Diagnosis not present

## 2016-01-02 DIAGNOSIS — R293 Abnormal posture: Secondary | ICD-10-CM | POA: Diagnosis not present

## 2016-01-02 DIAGNOSIS — M5126 Other intervertebral disc displacement, lumbar region: Secondary | ICD-10-CM | POA: Diagnosis not present

## 2016-01-02 DIAGNOSIS — M545 Low back pain: Secondary | ICD-10-CM | POA: Diagnosis not present

## 2016-01-02 DIAGNOSIS — M5127 Other intervertebral disc displacement, lumbosacral region: Secondary | ICD-10-CM | POA: Diagnosis not present

## 2016-01-03 DIAGNOSIS — R338 Other retention of urine: Secondary | ICD-10-CM | POA: Diagnosis not present

## 2016-01-03 DIAGNOSIS — R350 Frequency of micturition: Secondary | ICD-10-CM | POA: Diagnosis not present

## 2016-01-04 DIAGNOSIS — M5127 Other intervertebral disc displacement, lumbosacral region: Secondary | ICD-10-CM | POA: Diagnosis not present

## 2016-01-04 DIAGNOSIS — R293 Abnormal posture: Secondary | ICD-10-CM | POA: Diagnosis not present

## 2016-01-04 DIAGNOSIS — M5126 Other intervertebral disc displacement, lumbar region: Secondary | ICD-10-CM | POA: Diagnosis not present

## 2016-01-04 DIAGNOSIS — M545 Low back pain: Secondary | ICD-10-CM | POA: Diagnosis not present

## 2016-01-09 DIAGNOSIS — M5126 Other intervertebral disc displacement, lumbar region: Secondary | ICD-10-CM | POA: Diagnosis not present

## 2016-01-09 DIAGNOSIS — M5127 Other intervertebral disc displacement, lumbosacral region: Secondary | ICD-10-CM | POA: Diagnosis not present

## 2016-01-09 DIAGNOSIS — R338 Other retention of urine: Secondary | ICD-10-CM | POA: Diagnosis not present

## 2016-01-09 DIAGNOSIS — R293 Abnormal posture: Secondary | ICD-10-CM | POA: Diagnosis not present

## 2016-01-09 DIAGNOSIS — M545 Low back pain: Secondary | ICD-10-CM | POA: Diagnosis not present

## 2016-01-11 DIAGNOSIS — M545 Low back pain: Secondary | ICD-10-CM | POA: Diagnosis not present

## 2016-01-11 DIAGNOSIS — R293 Abnormal posture: Secondary | ICD-10-CM | POA: Diagnosis not present

## 2016-01-11 DIAGNOSIS — M5126 Other intervertebral disc displacement, lumbar region: Secondary | ICD-10-CM | POA: Diagnosis not present

## 2016-01-11 DIAGNOSIS — M5127 Other intervertebral disc displacement, lumbosacral region: Secondary | ICD-10-CM | POA: Diagnosis not present

## 2016-01-20 DIAGNOSIS — M5127 Other intervertebral disc displacement, lumbosacral region: Secondary | ICD-10-CM | POA: Diagnosis not present

## 2016-01-20 DIAGNOSIS — M545 Low back pain: Secondary | ICD-10-CM | POA: Diagnosis not present

## 2016-01-20 DIAGNOSIS — R293 Abnormal posture: Secondary | ICD-10-CM | POA: Diagnosis not present

## 2016-01-20 DIAGNOSIS — M5126 Other intervertebral disc displacement, lumbar region: Secondary | ICD-10-CM | POA: Diagnosis not present

## 2016-01-23 DIAGNOSIS — M5126 Other intervertebral disc displacement, lumbar region: Secondary | ICD-10-CM | POA: Diagnosis not present

## 2016-01-23 DIAGNOSIS — M5127 Other intervertebral disc displacement, lumbosacral region: Secondary | ICD-10-CM | POA: Diagnosis not present

## 2016-01-23 DIAGNOSIS — M545 Low back pain: Secondary | ICD-10-CM | POA: Diagnosis not present

## 2016-01-23 DIAGNOSIS — R293 Abnormal posture: Secondary | ICD-10-CM | POA: Diagnosis not present

## 2016-01-25 DIAGNOSIS — R293 Abnormal posture: Secondary | ICD-10-CM | POA: Diagnosis not present

## 2016-01-25 DIAGNOSIS — M5127 Other intervertebral disc displacement, lumbosacral region: Secondary | ICD-10-CM | POA: Diagnosis not present

## 2016-01-25 DIAGNOSIS — M545 Low back pain: Secondary | ICD-10-CM | POA: Diagnosis not present

## 2016-01-25 DIAGNOSIS — M5126 Other intervertebral disc displacement, lumbar region: Secondary | ICD-10-CM | POA: Diagnosis not present

## 2016-01-30 DIAGNOSIS — M545 Low back pain: Secondary | ICD-10-CM | POA: Diagnosis not present

## 2016-01-30 DIAGNOSIS — M5126 Other intervertebral disc displacement, lumbar region: Secondary | ICD-10-CM | POA: Diagnosis not present

## 2016-01-30 DIAGNOSIS — M5127 Other intervertebral disc displacement, lumbosacral region: Secondary | ICD-10-CM | POA: Diagnosis not present

## 2016-01-30 DIAGNOSIS — R293 Abnormal posture: Secondary | ICD-10-CM | POA: Diagnosis not present

## 2016-02-01 DIAGNOSIS — M5127 Other intervertebral disc displacement, lumbosacral region: Secondary | ICD-10-CM | POA: Diagnosis not present

## 2016-02-01 DIAGNOSIS — R293 Abnormal posture: Secondary | ICD-10-CM | POA: Diagnosis not present

## 2016-02-01 DIAGNOSIS — M545 Low back pain: Secondary | ICD-10-CM | POA: Diagnosis not present

## 2016-02-01 DIAGNOSIS — M5126 Other intervertebral disc displacement, lumbar region: Secondary | ICD-10-CM | POA: Diagnosis not present

## 2016-02-03 DIAGNOSIS — Z1231 Encounter for screening mammogram for malignant neoplasm of breast: Secondary | ICD-10-CM | POA: Diagnosis not present

## 2016-02-06 DIAGNOSIS — R293 Abnormal posture: Secondary | ICD-10-CM | POA: Diagnosis not present

## 2016-02-06 DIAGNOSIS — M5127 Other intervertebral disc displacement, lumbosacral region: Secondary | ICD-10-CM | POA: Diagnosis not present

## 2016-02-06 DIAGNOSIS — M545 Low back pain: Secondary | ICD-10-CM | POA: Diagnosis not present

## 2016-02-06 DIAGNOSIS — M5126 Other intervertebral disc displacement, lumbar region: Secondary | ICD-10-CM | POA: Diagnosis not present

## 2016-02-08 DIAGNOSIS — M5127 Other intervertebral disc displacement, lumbosacral region: Secondary | ICD-10-CM | POA: Diagnosis not present

## 2016-02-08 DIAGNOSIS — R293 Abnormal posture: Secondary | ICD-10-CM | POA: Diagnosis not present

## 2016-02-08 DIAGNOSIS — M5126 Other intervertebral disc displacement, lumbar region: Secondary | ICD-10-CM | POA: Diagnosis not present

## 2016-02-08 DIAGNOSIS — M545 Low back pain: Secondary | ICD-10-CM | POA: Diagnosis not present

## 2016-02-10 DIAGNOSIS — N3020 Other chronic cystitis without hematuria: Secondary | ICD-10-CM | POA: Diagnosis not present

## 2016-02-10 DIAGNOSIS — R3914 Feeling of incomplete bladder emptying: Secondary | ICD-10-CM | POA: Diagnosis not present

## 2016-02-13 DIAGNOSIS — M545 Low back pain: Secondary | ICD-10-CM | POA: Diagnosis not present

## 2016-02-13 DIAGNOSIS — M5127 Other intervertebral disc displacement, lumbosacral region: Secondary | ICD-10-CM | POA: Diagnosis not present

## 2016-02-13 DIAGNOSIS — M5126 Other intervertebral disc displacement, lumbar region: Secondary | ICD-10-CM | POA: Diagnosis not present

## 2016-02-13 DIAGNOSIS — R293 Abnormal posture: Secondary | ICD-10-CM | POA: Diagnosis not present

## 2016-02-15 DIAGNOSIS — M5127 Other intervertebral disc displacement, lumbosacral region: Secondary | ICD-10-CM | POA: Diagnosis not present

## 2016-02-15 DIAGNOSIS — R293 Abnormal posture: Secondary | ICD-10-CM | POA: Diagnosis not present

## 2016-02-15 DIAGNOSIS — M5126 Other intervertebral disc displacement, lumbar region: Secondary | ICD-10-CM | POA: Diagnosis not present

## 2016-02-15 DIAGNOSIS — M545 Low back pain: Secondary | ICD-10-CM | POA: Diagnosis not present

## 2016-02-20 DIAGNOSIS — M5127 Other intervertebral disc displacement, lumbosacral region: Secondary | ICD-10-CM | POA: Diagnosis not present

## 2016-02-20 DIAGNOSIS — M545 Low back pain: Secondary | ICD-10-CM | POA: Diagnosis not present

## 2016-02-20 DIAGNOSIS — R293 Abnormal posture: Secondary | ICD-10-CM | POA: Diagnosis not present

## 2016-02-20 DIAGNOSIS — M5126 Other intervertebral disc displacement, lumbar region: Secondary | ICD-10-CM | POA: Diagnosis not present

## 2016-02-27 DIAGNOSIS — M5127 Other intervertebral disc displacement, lumbosacral region: Secondary | ICD-10-CM | POA: Diagnosis not present

## 2016-02-27 DIAGNOSIS — R293 Abnormal posture: Secondary | ICD-10-CM | POA: Diagnosis not present

## 2016-02-27 DIAGNOSIS — M5126 Other intervertebral disc displacement, lumbar region: Secondary | ICD-10-CM | POA: Diagnosis not present

## 2016-02-27 DIAGNOSIS — M545 Low back pain: Secondary | ICD-10-CM | POA: Diagnosis not present

## 2016-02-28 DIAGNOSIS — N3020 Other chronic cystitis without hematuria: Secondary | ICD-10-CM | POA: Diagnosis not present

## 2016-02-29 DIAGNOSIS — M545 Low back pain: Secondary | ICD-10-CM | POA: Diagnosis not present

## 2016-02-29 DIAGNOSIS — M5127 Other intervertebral disc displacement, lumbosacral region: Secondary | ICD-10-CM | POA: Diagnosis not present

## 2016-02-29 DIAGNOSIS — M5126 Other intervertebral disc displacement, lumbar region: Secondary | ICD-10-CM | POA: Diagnosis not present

## 2016-02-29 DIAGNOSIS — R293 Abnormal posture: Secondary | ICD-10-CM | POA: Diagnosis not present

## 2016-03-05 DIAGNOSIS — M5127 Other intervertebral disc displacement, lumbosacral region: Secondary | ICD-10-CM | POA: Diagnosis not present

## 2016-03-05 DIAGNOSIS — R293 Abnormal posture: Secondary | ICD-10-CM | POA: Diagnosis not present

## 2016-03-05 DIAGNOSIS — M5126 Other intervertebral disc displacement, lumbar region: Secondary | ICD-10-CM | POA: Diagnosis not present

## 2016-03-05 DIAGNOSIS — M545 Low back pain: Secondary | ICD-10-CM | POA: Diagnosis not present

## 2016-03-07 DIAGNOSIS — M5127 Other intervertebral disc displacement, lumbosacral region: Secondary | ICD-10-CM | POA: Diagnosis not present

## 2016-03-07 DIAGNOSIS — R293 Abnormal posture: Secondary | ICD-10-CM | POA: Diagnosis not present

## 2016-03-07 DIAGNOSIS — M545 Low back pain: Secondary | ICD-10-CM | POA: Diagnosis not present

## 2016-03-07 DIAGNOSIS — M5126 Other intervertebral disc displacement, lumbar region: Secondary | ICD-10-CM | POA: Diagnosis not present

## 2016-03-14 DIAGNOSIS — R293 Abnormal posture: Secondary | ICD-10-CM | POA: Diagnosis not present

## 2016-03-14 DIAGNOSIS — M5127 Other intervertebral disc displacement, lumbosacral region: Secondary | ICD-10-CM | POA: Diagnosis not present

## 2016-03-14 DIAGNOSIS — M5126 Other intervertebral disc displacement, lumbar region: Secondary | ICD-10-CM | POA: Diagnosis not present

## 2016-03-14 DIAGNOSIS — M545 Low back pain: Secondary | ICD-10-CM | POA: Diagnosis not present

## 2016-03-21 DIAGNOSIS — M545 Low back pain: Secondary | ICD-10-CM | POA: Diagnosis not present

## 2016-03-21 DIAGNOSIS — M5126 Other intervertebral disc displacement, lumbar region: Secondary | ICD-10-CM | POA: Diagnosis not present

## 2016-03-21 DIAGNOSIS — M5127 Other intervertebral disc displacement, lumbosacral region: Secondary | ICD-10-CM | POA: Diagnosis not present

## 2016-03-21 DIAGNOSIS — R293 Abnormal posture: Secondary | ICD-10-CM | POA: Diagnosis not present

## 2016-03-26 DIAGNOSIS — R293 Abnormal posture: Secondary | ICD-10-CM | POA: Diagnosis not present

## 2016-03-26 DIAGNOSIS — M5126 Other intervertebral disc displacement, lumbar region: Secondary | ICD-10-CM | POA: Diagnosis not present

## 2016-03-26 DIAGNOSIS — M545 Low back pain: Secondary | ICD-10-CM | POA: Diagnosis not present

## 2016-03-26 DIAGNOSIS — M5127 Other intervertebral disc displacement, lumbosacral region: Secondary | ICD-10-CM | POA: Diagnosis not present

## 2016-03-28 DIAGNOSIS — M545 Low back pain: Secondary | ICD-10-CM | POA: Diagnosis not present

## 2016-03-28 DIAGNOSIS — R293 Abnormal posture: Secondary | ICD-10-CM | POA: Diagnosis not present

## 2016-03-28 DIAGNOSIS — M5126 Other intervertebral disc displacement, lumbar region: Secondary | ICD-10-CM | POA: Diagnosis not present

## 2016-03-28 DIAGNOSIS — M5127 Other intervertebral disc displacement, lumbosacral region: Secondary | ICD-10-CM | POA: Diagnosis not present

## 2016-04-02 DIAGNOSIS — R293 Abnormal posture: Secondary | ICD-10-CM | POA: Diagnosis not present

## 2016-04-02 DIAGNOSIS — M5126 Other intervertebral disc displacement, lumbar region: Secondary | ICD-10-CM | POA: Diagnosis not present

## 2016-04-02 DIAGNOSIS — M5127 Other intervertebral disc displacement, lumbosacral region: Secondary | ICD-10-CM | POA: Diagnosis not present

## 2016-04-02 DIAGNOSIS — M545 Low back pain: Secondary | ICD-10-CM | POA: Diagnosis not present

## 2016-04-06 DIAGNOSIS — R3982 Chronic bladder pain: Secondary | ICD-10-CM | POA: Diagnosis not present

## 2016-04-09 DIAGNOSIS — M545 Low back pain: Secondary | ICD-10-CM | POA: Diagnosis not present

## 2016-04-09 DIAGNOSIS — R293 Abnormal posture: Secondary | ICD-10-CM | POA: Diagnosis not present

## 2016-04-09 DIAGNOSIS — M5126 Other intervertebral disc displacement, lumbar region: Secondary | ICD-10-CM | POA: Diagnosis not present

## 2016-04-09 DIAGNOSIS — M5127 Other intervertebral disc displacement, lumbosacral region: Secondary | ICD-10-CM | POA: Diagnosis not present

## 2016-04-23 DIAGNOSIS — E559 Vitamin D deficiency, unspecified: Secondary | ICD-10-CM | POA: Diagnosis not present

## 2016-04-23 DIAGNOSIS — Z862 Personal history of diseases of the blood and blood-forming organs and certain disorders involving the immune mechanism: Secondary | ICD-10-CM | POA: Diagnosis not present

## 2016-04-27 DIAGNOSIS — Z0000 Encounter for general adult medical examination without abnormal findings: Secondary | ICD-10-CM | POA: Diagnosis not present

## 2016-04-27 DIAGNOSIS — Z23 Encounter for immunization: Secondary | ICD-10-CM | POA: Diagnosis not present

## 2016-07-02 DIAGNOSIS — M25551 Pain in right hip: Secondary | ICD-10-CM | POA: Diagnosis not present

## 2016-08-02 DIAGNOSIS — J3489 Other specified disorders of nose and nasal sinuses: Secondary | ICD-10-CM | POA: Diagnosis not present

## 2016-08-02 DIAGNOSIS — J343 Hypertrophy of nasal turbinates: Secondary | ICD-10-CM | POA: Diagnosis not present

## 2016-08-28 DIAGNOSIS — R3912 Poor urinary stream: Secondary | ICD-10-CM | POA: Diagnosis not present

## 2016-08-28 DIAGNOSIS — R3914 Feeling of incomplete bladder emptying: Secondary | ICD-10-CM | POA: Diagnosis not present

## 2016-08-28 DIAGNOSIS — R8271 Bacteriuria: Secondary | ICD-10-CM | POA: Diagnosis not present

## 2016-08-31 DIAGNOSIS — J343 Hypertrophy of nasal turbinates: Secondary | ICD-10-CM | POA: Diagnosis not present

## 2016-08-31 DIAGNOSIS — J3489 Other specified disorders of nose and nasal sinuses: Secondary | ICD-10-CM | POA: Diagnosis not present

## 2016-08-31 DIAGNOSIS — J342 Deviated nasal septum: Secondary | ICD-10-CM | POA: Diagnosis not present

## 2016-09-28 DIAGNOSIS — R293 Abnormal posture: Secondary | ICD-10-CM | POA: Diagnosis not present

## 2016-09-28 DIAGNOSIS — M1991 Primary osteoarthritis, unspecified site: Secondary | ICD-10-CM | POA: Diagnosis not present

## 2016-09-28 DIAGNOSIS — R269 Unspecified abnormalities of gait and mobility: Secondary | ICD-10-CM | POA: Diagnosis not present

## 2016-09-28 DIAGNOSIS — M545 Low back pain: Secondary | ICD-10-CM | POA: Diagnosis not present

## 2016-10-01 DIAGNOSIS — R293 Abnormal posture: Secondary | ICD-10-CM | POA: Diagnosis not present

## 2016-10-01 DIAGNOSIS — R269 Unspecified abnormalities of gait and mobility: Secondary | ICD-10-CM | POA: Diagnosis not present

## 2016-10-01 DIAGNOSIS — M1991 Primary osteoarthritis, unspecified site: Secondary | ICD-10-CM | POA: Diagnosis not present

## 2016-10-01 DIAGNOSIS — M545 Low back pain: Secondary | ICD-10-CM | POA: Diagnosis not present

## 2016-10-03 DIAGNOSIS — R293 Abnormal posture: Secondary | ICD-10-CM | POA: Diagnosis not present

## 2016-10-03 DIAGNOSIS — R269 Unspecified abnormalities of gait and mobility: Secondary | ICD-10-CM | POA: Diagnosis not present

## 2016-10-03 DIAGNOSIS — M545 Low back pain: Secondary | ICD-10-CM | POA: Diagnosis not present

## 2016-10-03 DIAGNOSIS — M1991 Primary osteoarthritis, unspecified site: Secondary | ICD-10-CM | POA: Diagnosis not present

## 2016-10-08 DIAGNOSIS — M545 Low back pain: Secondary | ICD-10-CM | POA: Diagnosis not present

## 2016-10-08 DIAGNOSIS — M1991 Primary osteoarthritis, unspecified site: Secondary | ICD-10-CM | POA: Diagnosis not present

## 2016-10-08 DIAGNOSIS — R269 Unspecified abnormalities of gait and mobility: Secondary | ICD-10-CM | POA: Diagnosis not present

## 2016-10-08 DIAGNOSIS — R293 Abnormal posture: Secondary | ICD-10-CM | POA: Diagnosis not present

## 2016-10-10 DIAGNOSIS — M545 Low back pain: Secondary | ICD-10-CM | POA: Diagnosis not present

## 2016-10-10 DIAGNOSIS — R269 Unspecified abnormalities of gait and mobility: Secondary | ICD-10-CM | POA: Diagnosis not present

## 2016-10-10 DIAGNOSIS — M1991 Primary osteoarthritis, unspecified site: Secondary | ICD-10-CM | POA: Diagnosis not present

## 2016-10-10 DIAGNOSIS — R293 Abnormal posture: Secondary | ICD-10-CM | POA: Diagnosis not present

## 2016-10-15 DIAGNOSIS — R269 Unspecified abnormalities of gait and mobility: Secondary | ICD-10-CM | POA: Diagnosis not present

## 2016-10-15 DIAGNOSIS — R293 Abnormal posture: Secondary | ICD-10-CM | POA: Diagnosis not present

## 2016-10-15 DIAGNOSIS — M1991 Primary osteoarthritis, unspecified site: Secondary | ICD-10-CM | POA: Diagnosis not present

## 2016-10-15 DIAGNOSIS — M545 Low back pain: Secondary | ICD-10-CM | POA: Diagnosis not present

## 2016-10-17 DIAGNOSIS — R293 Abnormal posture: Secondary | ICD-10-CM | POA: Diagnosis not present

## 2016-10-17 DIAGNOSIS — M1991 Primary osteoarthritis, unspecified site: Secondary | ICD-10-CM | POA: Diagnosis not present

## 2016-10-17 DIAGNOSIS — R269 Unspecified abnormalities of gait and mobility: Secondary | ICD-10-CM | POA: Diagnosis not present

## 2016-10-17 DIAGNOSIS — M545 Low back pain: Secondary | ICD-10-CM | POA: Diagnosis not present

## 2016-10-22 DIAGNOSIS — R293 Abnormal posture: Secondary | ICD-10-CM | POA: Diagnosis not present

## 2016-10-22 DIAGNOSIS — R269 Unspecified abnormalities of gait and mobility: Secondary | ICD-10-CM | POA: Diagnosis not present

## 2016-10-22 DIAGNOSIS — M545 Low back pain: Secondary | ICD-10-CM | POA: Diagnosis not present

## 2016-10-22 DIAGNOSIS — M1991 Primary osteoarthritis, unspecified site: Secondary | ICD-10-CM | POA: Diagnosis not present

## 2016-10-24 DIAGNOSIS — M545 Low back pain: Secondary | ICD-10-CM | POA: Diagnosis not present

## 2016-10-24 DIAGNOSIS — R293 Abnormal posture: Secondary | ICD-10-CM | POA: Diagnosis not present

## 2016-10-24 DIAGNOSIS — R269 Unspecified abnormalities of gait and mobility: Secondary | ICD-10-CM | POA: Diagnosis not present

## 2016-10-24 DIAGNOSIS — M1991 Primary osteoarthritis, unspecified site: Secondary | ICD-10-CM | POA: Diagnosis not present

## 2016-10-26 DIAGNOSIS — M7061 Trochanteric bursitis, right hip: Secondary | ICD-10-CM | POA: Diagnosis not present

## 2016-10-29 DIAGNOSIS — R269 Unspecified abnormalities of gait and mobility: Secondary | ICD-10-CM | POA: Diagnosis not present

## 2016-10-29 DIAGNOSIS — M545 Low back pain: Secondary | ICD-10-CM | POA: Diagnosis not present

## 2016-10-29 DIAGNOSIS — M1991 Primary osteoarthritis, unspecified site: Secondary | ICD-10-CM | POA: Diagnosis not present

## 2016-10-29 DIAGNOSIS — R293 Abnormal posture: Secondary | ICD-10-CM | POA: Diagnosis not present

## 2016-10-30 DIAGNOSIS — J3489 Other specified disorders of nose and nasal sinuses: Secondary | ICD-10-CM | POA: Diagnosis not present

## 2016-10-31 DIAGNOSIS — M1991 Primary osteoarthritis, unspecified site: Secondary | ICD-10-CM | POA: Diagnosis not present

## 2016-10-31 DIAGNOSIS — R293 Abnormal posture: Secondary | ICD-10-CM | POA: Diagnosis not present

## 2016-10-31 DIAGNOSIS — R269 Unspecified abnormalities of gait and mobility: Secondary | ICD-10-CM | POA: Diagnosis not present

## 2016-10-31 DIAGNOSIS — M545 Low back pain: Secondary | ICD-10-CM | POA: Diagnosis not present

## 2016-11-05 DIAGNOSIS — R269 Unspecified abnormalities of gait and mobility: Secondary | ICD-10-CM | POA: Diagnosis not present

## 2016-11-05 DIAGNOSIS — M1991 Primary osteoarthritis, unspecified site: Secondary | ICD-10-CM | POA: Diagnosis not present

## 2016-11-05 DIAGNOSIS — R293 Abnormal posture: Secondary | ICD-10-CM | POA: Diagnosis not present

## 2016-11-05 DIAGNOSIS — M545 Low back pain: Secondary | ICD-10-CM | POA: Diagnosis not present

## 2016-11-07 DIAGNOSIS — R269 Unspecified abnormalities of gait and mobility: Secondary | ICD-10-CM | POA: Diagnosis not present

## 2016-11-07 DIAGNOSIS — M1991 Primary osteoarthritis, unspecified site: Secondary | ICD-10-CM | POA: Diagnosis not present

## 2016-11-07 DIAGNOSIS — R293 Abnormal posture: Secondary | ICD-10-CM | POA: Diagnosis not present

## 2016-11-07 DIAGNOSIS — M545 Low back pain: Secondary | ICD-10-CM | POA: Diagnosis not present

## 2016-11-19 DIAGNOSIS — R269 Unspecified abnormalities of gait and mobility: Secondary | ICD-10-CM | POA: Diagnosis not present

## 2016-11-19 DIAGNOSIS — R293 Abnormal posture: Secondary | ICD-10-CM | POA: Diagnosis not present

## 2016-11-19 DIAGNOSIS — M1991 Primary osteoarthritis, unspecified site: Secondary | ICD-10-CM | POA: Diagnosis not present

## 2016-11-19 DIAGNOSIS — M545 Low back pain: Secondary | ICD-10-CM | POA: Diagnosis not present

## 2016-11-21 DIAGNOSIS — R293 Abnormal posture: Secondary | ICD-10-CM | POA: Diagnosis not present

## 2016-11-21 DIAGNOSIS — M1991 Primary osteoarthritis, unspecified site: Secondary | ICD-10-CM | POA: Diagnosis not present

## 2016-11-21 DIAGNOSIS — M545 Low back pain: Secondary | ICD-10-CM | POA: Diagnosis not present

## 2016-11-21 DIAGNOSIS — R269 Unspecified abnormalities of gait and mobility: Secondary | ICD-10-CM | POA: Diagnosis not present

## 2016-11-26 DIAGNOSIS — R293 Abnormal posture: Secondary | ICD-10-CM | POA: Diagnosis not present

## 2016-11-26 DIAGNOSIS — R269 Unspecified abnormalities of gait and mobility: Secondary | ICD-10-CM | POA: Diagnosis not present

## 2016-11-26 DIAGNOSIS — M1991 Primary osteoarthritis, unspecified site: Secondary | ICD-10-CM | POA: Diagnosis not present

## 2016-11-26 DIAGNOSIS — M545 Low back pain: Secondary | ICD-10-CM | POA: Diagnosis not present

## 2016-11-28 DIAGNOSIS — R293 Abnormal posture: Secondary | ICD-10-CM | POA: Diagnosis not present

## 2016-11-28 DIAGNOSIS — M1991 Primary osteoarthritis, unspecified site: Secondary | ICD-10-CM | POA: Diagnosis not present

## 2016-11-28 DIAGNOSIS — M545 Low back pain: Secondary | ICD-10-CM | POA: Diagnosis not present

## 2016-11-28 DIAGNOSIS — R269 Unspecified abnormalities of gait and mobility: Secondary | ICD-10-CM | POA: Diagnosis not present

## 2016-12-03 DIAGNOSIS — M1991 Primary osteoarthritis, unspecified site: Secondary | ICD-10-CM | POA: Diagnosis not present

## 2016-12-03 DIAGNOSIS — R293 Abnormal posture: Secondary | ICD-10-CM | POA: Diagnosis not present

## 2016-12-03 DIAGNOSIS — M545 Low back pain: Secondary | ICD-10-CM | POA: Diagnosis not present

## 2016-12-03 DIAGNOSIS — R269 Unspecified abnormalities of gait and mobility: Secondary | ICD-10-CM | POA: Diagnosis not present

## 2016-12-04 DIAGNOSIS — G43009 Migraine without aura, not intractable, without status migrainosus: Secondary | ICD-10-CM | POA: Diagnosis not present

## 2016-12-05 DIAGNOSIS — R269 Unspecified abnormalities of gait and mobility: Secondary | ICD-10-CM | POA: Diagnosis not present

## 2016-12-05 DIAGNOSIS — M545 Low back pain: Secondary | ICD-10-CM | POA: Diagnosis not present

## 2016-12-05 DIAGNOSIS — R293 Abnormal posture: Secondary | ICD-10-CM | POA: Diagnosis not present

## 2016-12-05 DIAGNOSIS — M1991 Primary osteoarthritis, unspecified site: Secondary | ICD-10-CM | POA: Diagnosis not present

## 2016-12-12 DIAGNOSIS — R293 Abnormal posture: Secondary | ICD-10-CM | POA: Diagnosis not present

## 2016-12-12 DIAGNOSIS — M1991 Primary osteoarthritis, unspecified site: Secondary | ICD-10-CM | POA: Diagnosis not present

## 2016-12-12 DIAGNOSIS — M545 Low back pain: Secondary | ICD-10-CM | POA: Diagnosis not present

## 2016-12-12 DIAGNOSIS — R269 Unspecified abnormalities of gait and mobility: Secondary | ICD-10-CM | POA: Diagnosis not present

## 2016-12-17 DIAGNOSIS — M1991 Primary osteoarthritis, unspecified site: Secondary | ICD-10-CM | POA: Diagnosis not present

## 2016-12-17 DIAGNOSIS — R269 Unspecified abnormalities of gait and mobility: Secondary | ICD-10-CM | POA: Diagnosis not present

## 2016-12-17 DIAGNOSIS — R293 Abnormal posture: Secondary | ICD-10-CM | POA: Diagnosis not present

## 2016-12-17 DIAGNOSIS — M545 Low back pain: Secondary | ICD-10-CM | POA: Diagnosis not present

## 2016-12-19 DIAGNOSIS — R293 Abnormal posture: Secondary | ICD-10-CM | POA: Diagnosis not present

## 2016-12-19 DIAGNOSIS — M1991 Primary osteoarthritis, unspecified site: Secondary | ICD-10-CM | POA: Diagnosis not present

## 2016-12-19 DIAGNOSIS — R269 Unspecified abnormalities of gait and mobility: Secondary | ICD-10-CM | POA: Diagnosis not present

## 2016-12-19 DIAGNOSIS — M545 Low back pain: Secondary | ICD-10-CM | POA: Diagnosis not present

## 2016-12-24 DIAGNOSIS — M1991 Primary osteoarthritis, unspecified site: Secondary | ICD-10-CM | POA: Diagnosis not present

## 2016-12-24 DIAGNOSIS — M545 Low back pain: Secondary | ICD-10-CM | POA: Diagnosis not present

## 2016-12-24 DIAGNOSIS — R293 Abnormal posture: Secondary | ICD-10-CM | POA: Diagnosis not present

## 2016-12-24 DIAGNOSIS — R269 Unspecified abnormalities of gait and mobility: Secondary | ICD-10-CM | POA: Diagnosis not present

## 2016-12-26 DIAGNOSIS — R293 Abnormal posture: Secondary | ICD-10-CM | POA: Diagnosis not present

## 2016-12-26 DIAGNOSIS — M1991 Primary osteoarthritis, unspecified site: Secondary | ICD-10-CM | POA: Diagnosis not present

## 2016-12-26 DIAGNOSIS — R269 Unspecified abnormalities of gait and mobility: Secondary | ICD-10-CM | POA: Diagnosis not present

## 2016-12-26 DIAGNOSIS — M545 Low back pain: Secondary | ICD-10-CM | POA: Diagnosis not present

## 2016-12-31 DIAGNOSIS — R293 Abnormal posture: Secondary | ICD-10-CM | POA: Diagnosis not present

## 2016-12-31 DIAGNOSIS — M1991 Primary osteoarthritis, unspecified site: Secondary | ICD-10-CM | POA: Diagnosis not present

## 2016-12-31 DIAGNOSIS — R269 Unspecified abnormalities of gait and mobility: Secondary | ICD-10-CM | POA: Diagnosis not present

## 2016-12-31 DIAGNOSIS — M545 Low back pain: Secondary | ICD-10-CM | POA: Diagnosis not present

## 2017-01-02 DIAGNOSIS — R269 Unspecified abnormalities of gait and mobility: Secondary | ICD-10-CM | POA: Diagnosis not present

## 2017-01-02 DIAGNOSIS — M545 Low back pain: Secondary | ICD-10-CM | POA: Diagnosis not present

## 2017-01-02 DIAGNOSIS — R293 Abnormal posture: Secondary | ICD-10-CM | POA: Diagnosis not present

## 2017-01-02 DIAGNOSIS — M1991 Primary osteoarthritis, unspecified site: Secondary | ICD-10-CM | POA: Diagnosis not present

## 2017-01-07 DIAGNOSIS — R269 Unspecified abnormalities of gait and mobility: Secondary | ICD-10-CM | POA: Diagnosis not present

## 2017-01-07 DIAGNOSIS — R293 Abnormal posture: Secondary | ICD-10-CM | POA: Diagnosis not present

## 2017-01-07 DIAGNOSIS — M545 Low back pain: Secondary | ICD-10-CM | POA: Diagnosis not present

## 2017-01-07 DIAGNOSIS — M1991 Primary osteoarthritis, unspecified site: Secondary | ICD-10-CM | POA: Diagnosis not present

## 2017-01-09 DIAGNOSIS — M1991 Primary osteoarthritis, unspecified site: Secondary | ICD-10-CM | POA: Diagnosis not present

## 2017-01-09 DIAGNOSIS — R293 Abnormal posture: Secondary | ICD-10-CM | POA: Diagnosis not present

## 2017-01-09 DIAGNOSIS — M545 Low back pain: Secondary | ICD-10-CM | POA: Diagnosis not present

## 2017-01-09 DIAGNOSIS — R269 Unspecified abnormalities of gait and mobility: Secondary | ICD-10-CM | POA: Diagnosis not present

## 2017-01-14 DIAGNOSIS — R293 Abnormal posture: Secondary | ICD-10-CM | POA: Diagnosis not present

## 2017-01-14 DIAGNOSIS — M545 Low back pain: Secondary | ICD-10-CM | POA: Diagnosis not present

## 2017-01-14 DIAGNOSIS — R269 Unspecified abnormalities of gait and mobility: Secondary | ICD-10-CM | POA: Diagnosis not present

## 2017-01-14 DIAGNOSIS — M1991 Primary osteoarthritis, unspecified site: Secondary | ICD-10-CM | POA: Diagnosis not present

## 2017-01-21 DIAGNOSIS — R269 Unspecified abnormalities of gait and mobility: Secondary | ICD-10-CM | POA: Diagnosis not present

## 2017-01-21 DIAGNOSIS — M1991 Primary osteoarthritis, unspecified site: Secondary | ICD-10-CM | POA: Diagnosis not present

## 2017-01-21 DIAGNOSIS — M545 Low back pain: Secondary | ICD-10-CM | POA: Diagnosis not present

## 2017-01-21 DIAGNOSIS — R293 Abnormal posture: Secondary | ICD-10-CM | POA: Diagnosis not present

## 2017-01-23 DIAGNOSIS — R293 Abnormal posture: Secondary | ICD-10-CM | POA: Diagnosis not present

## 2017-01-23 DIAGNOSIS — R269 Unspecified abnormalities of gait and mobility: Secondary | ICD-10-CM | POA: Diagnosis not present

## 2017-01-23 DIAGNOSIS — M545 Low back pain: Secondary | ICD-10-CM | POA: Diagnosis not present

## 2017-01-23 DIAGNOSIS — M1991 Primary osteoarthritis, unspecified site: Secondary | ICD-10-CM | POA: Diagnosis not present

## 2017-01-28 DIAGNOSIS — M1991 Primary osteoarthritis, unspecified site: Secondary | ICD-10-CM | POA: Diagnosis not present

## 2017-01-28 DIAGNOSIS — R269 Unspecified abnormalities of gait and mobility: Secondary | ICD-10-CM | POA: Diagnosis not present

## 2017-01-28 DIAGNOSIS — M545 Low back pain: Secondary | ICD-10-CM | POA: Diagnosis not present

## 2017-01-28 DIAGNOSIS — R293 Abnormal posture: Secondary | ICD-10-CM | POA: Diagnosis not present

## 2017-02-04 DIAGNOSIS — R269 Unspecified abnormalities of gait and mobility: Secondary | ICD-10-CM | POA: Diagnosis not present

## 2017-02-04 DIAGNOSIS — M1991 Primary osteoarthritis, unspecified site: Secondary | ICD-10-CM | POA: Diagnosis not present

## 2017-02-04 DIAGNOSIS — R293 Abnormal posture: Secondary | ICD-10-CM | POA: Diagnosis not present

## 2017-02-04 DIAGNOSIS — M545 Low back pain: Secondary | ICD-10-CM | POA: Diagnosis not present

## 2017-02-06 DIAGNOSIS — R293 Abnormal posture: Secondary | ICD-10-CM | POA: Diagnosis not present

## 2017-02-06 DIAGNOSIS — M1991 Primary osteoarthritis, unspecified site: Secondary | ICD-10-CM | POA: Diagnosis not present

## 2017-02-06 DIAGNOSIS — M545 Low back pain: Secondary | ICD-10-CM | POA: Diagnosis not present

## 2017-02-06 DIAGNOSIS — R269 Unspecified abnormalities of gait and mobility: Secondary | ICD-10-CM | POA: Diagnosis not present

## 2017-02-11 DIAGNOSIS — M1991 Primary osteoarthritis, unspecified site: Secondary | ICD-10-CM | POA: Diagnosis not present

## 2017-02-11 DIAGNOSIS — R293 Abnormal posture: Secondary | ICD-10-CM | POA: Diagnosis not present

## 2017-02-11 DIAGNOSIS — R269 Unspecified abnormalities of gait and mobility: Secondary | ICD-10-CM | POA: Diagnosis not present

## 2017-02-11 DIAGNOSIS — M545 Low back pain: Secondary | ICD-10-CM | POA: Diagnosis not present

## 2017-02-13 DIAGNOSIS — M1991 Primary osteoarthritis, unspecified site: Secondary | ICD-10-CM | POA: Diagnosis not present

## 2017-02-13 DIAGNOSIS — R293 Abnormal posture: Secondary | ICD-10-CM | POA: Diagnosis not present

## 2017-02-13 DIAGNOSIS — R269 Unspecified abnormalities of gait and mobility: Secondary | ICD-10-CM | POA: Diagnosis not present

## 2017-02-13 DIAGNOSIS — M545 Low back pain: Secondary | ICD-10-CM | POA: Diagnosis not present

## 2017-02-26 DIAGNOSIS — M545 Low back pain: Secondary | ICD-10-CM | POA: Diagnosis not present

## 2017-02-26 DIAGNOSIS — M549 Dorsalgia, unspecified: Secondary | ICD-10-CM | POA: Diagnosis not present

## 2017-02-26 DIAGNOSIS — M47816 Spondylosis without myelopathy or radiculopathy, lumbar region: Secondary | ICD-10-CM | POA: Diagnosis not present

## 2017-02-26 DIAGNOSIS — M961 Postlaminectomy syndrome, not elsewhere classified: Secondary | ICD-10-CM | POA: Diagnosis not present

## 2017-03-02 DIAGNOSIS — M5126 Other intervertebral disc displacement, lumbar region: Secondary | ICD-10-CM | POA: Diagnosis not present

## 2017-03-02 DIAGNOSIS — M4716 Other spondylosis with myelopathy, lumbar region: Secondary | ICD-10-CM | POA: Diagnosis not present

## 2017-03-02 DIAGNOSIS — M545 Low back pain: Secondary | ICD-10-CM | POA: Diagnosis not present

## 2017-03-04 DIAGNOSIS — R293 Abnormal posture: Secondary | ICD-10-CM | POA: Diagnosis not present

## 2017-03-04 DIAGNOSIS — M1991 Primary osteoarthritis, unspecified site: Secondary | ICD-10-CM | POA: Diagnosis not present

## 2017-03-04 DIAGNOSIS — R269 Unspecified abnormalities of gait and mobility: Secondary | ICD-10-CM | POA: Diagnosis not present

## 2017-03-04 DIAGNOSIS — M545 Low back pain: Secondary | ICD-10-CM | POA: Diagnosis not present

## 2017-03-12 DIAGNOSIS — M545 Low back pain: Secondary | ICD-10-CM | POA: Diagnosis not present

## 2017-03-12 DIAGNOSIS — R269 Unspecified abnormalities of gait and mobility: Secondary | ICD-10-CM | POA: Diagnosis not present

## 2017-03-12 DIAGNOSIS — M1991 Primary osteoarthritis, unspecified site: Secondary | ICD-10-CM | POA: Diagnosis not present

## 2017-03-12 DIAGNOSIS — R293 Abnormal posture: Secondary | ICD-10-CM | POA: Diagnosis not present

## 2017-03-14 DIAGNOSIS — M961 Postlaminectomy syndrome, not elsewhere classified: Secondary | ICD-10-CM | POA: Diagnosis not present

## 2017-03-14 DIAGNOSIS — M47816 Spondylosis without myelopathy or radiculopathy, lumbar region: Secondary | ICD-10-CM | POA: Diagnosis not present

## 2017-03-14 DIAGNOSIS — Z6820 Body mass index (BMI) 20.0-20.9, adult: Secondary | ICD-10-CM | POA: Diagnosis not present

## 2017-03-20 DIAGNOSIS — M1991 Primary osteoarthritis, unspecified site: Secondary | ICD-10-CM | POA: Diagnosis not present

## 2017-03-20 DIAGNOSIS — R293 Abnormal posture: Secondary | ICD-10-CM | POA: Diagnosis not present

## 2017-03-20 DIAGNOSIS — M545 Low back pain: Secondary | ICD-10-CM | POA: Diagnosis not present

## 2017-03-20 DIAGNOSIS — R269 Unspecified abnormalities of gait and mobility: Secondary | ICD-10-CM | POA: Diagnosis not present

## 2017-03-25 DIAGNOSIS — M1991 Primary osteoarthritis, unspecified site: Secondary | ICD-10-CM | POA: Diagnosis not present

## 2017-03-25 DIAGNOSIS — R293 Abnormal posture: Secondary | ICD-10-CM | POA: Diagnosis not present

## 2017-03-25 DIAGNOSIS — R269 Unspecified abnormalities of gait and mobility: Secondary | ICD-10-CM | POA: Diagnosis not present

## 2017-03-25 DIAGNOSIS — M545 Low back pain: Secondary | ICD-10-CM | POA: Diagnosis not present

## 2017-03-29 DIAGNOSIS — M545 Low back pain: Secondary | ICD-10-CM | POA: Diagnosis not present

## 2017-03-29 DIAGNOSIS — M47816 Spondylosis without myelopathy or radiculopathy, lumbar region: Secondary | ICD-10-CM | POA: Diagnosis not present

## 2017-03-29 DIAGNOSIS — M961 Postlaminectomy syndrome, not elsewhere classified: Secondary | ICD-10-CM | POA: Diagnosis not present

## 2017-04-16 DIAGNOSIS — M47816 Spondylosis without myelopathy or radiculopathy, lumbar region: Secondary | ICD-10-CM | POA: Diagnosis not present

## 2017-05-06 DIAGNOSIS — M5136 Other intervertebral disc degeneration, lumbar region: Secondary | ICD-10-CM | POA: Diagnosis not present

## 2017-06-04 DIAGNOSIS — M5136 Other intervertebral disc degeneration, lumbar region: Secondary | ICD-10-CM | POA: Diagnosis not present

## 2017-06-14 DIAGNOSIS — M5136 Other intervertebral disc degeneration, lumbar region: Secondary | ICD-10-CM | POA: Diagnosis not present

## 2017-06-14 DIAGNOSIS — M7918 Myalgia, other site: Secondary | ICD-10-CM | POA: Diagnosis not present

## 2017-06-14 DIAGNOSIS — M47816 Spondylosis without myelopathy or radiculopathy, lumbar region: Secondary | ICD-10-CM | POA: Diagnosis not present

## 2017-06-26 DIAGNOSIS — Z79891 Long term (current) use of opiate analgesic: Secondary | ICD-10-CM | POA: Diagnosis not present

## 2017-06-26 DIAGNOSIS — Z79899 Other long term (current) drug therapy: Secondary | ICD-10-CM | POA: Diagnosis not present

## 2017-06-26 DIAGNOSIS — M47816 Spondylosis without myelopathy or radiculopathy, lumbar region: Secondary | ICD-10-CM | POA: Diagnosis not present

## 2017-06-26 DIAGNOSIS — G894 Chronic pain syndrome: Secondary | ICD-10-CM | POA: Diagnosis not present

## 2017-07-01 IMAGING — CT CT L SPINE W/ CM
1 of 6 series · 6 of 14 positions shown, 8 images · non-contrast
Comparison: MRI lumbar spine 08/21/2014.

CLINICAL DATA: Low back pain. Without sciatica. History of
kyphoplasty.
TECHNIQUE: Contiguous axial images were obtained through the Lumbar spine after
the intrathecal infusion of infusion. Coronal and sagittal
reconstructions were obtained of the axial image sets.

[Series 7: l spine soft · axial · 0.24mm/px · z∈[+837,+990]mm · 6 of 73 slices shown, 8 images]
[im 11/73  soft-tissue]
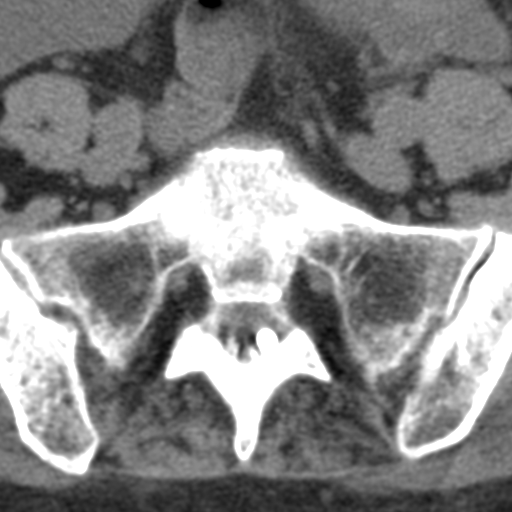
[im 11/73  bone]
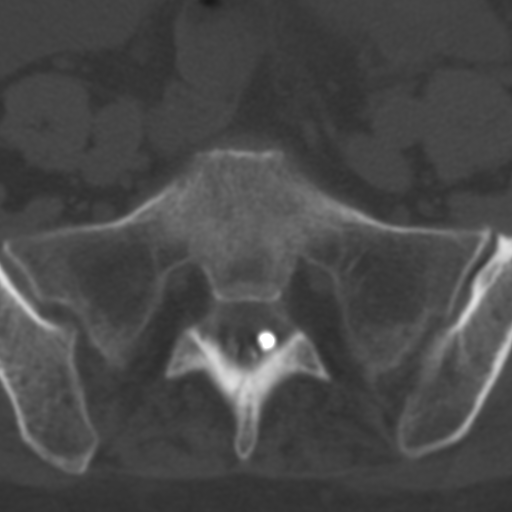
[im 21/73  bone]
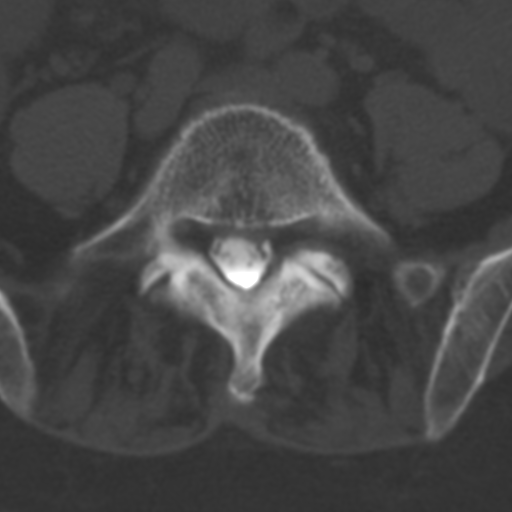
[im 31/73  bone]
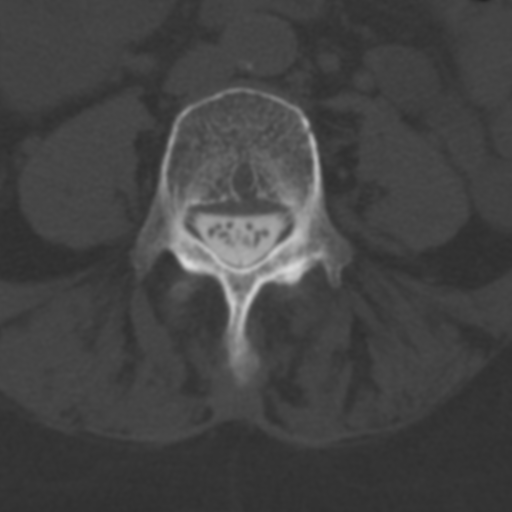
[im 42/73  bone]
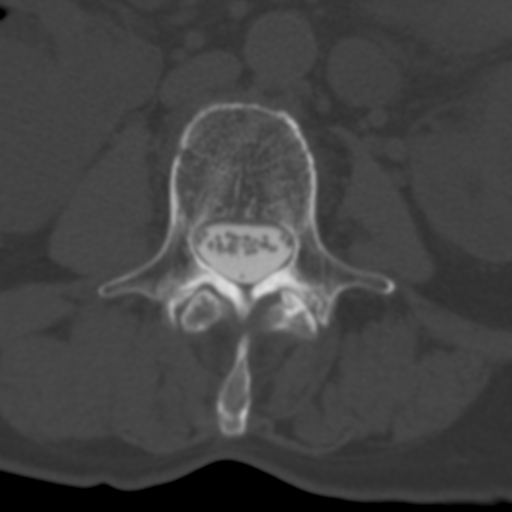
[im 52/73  soft-tissue]
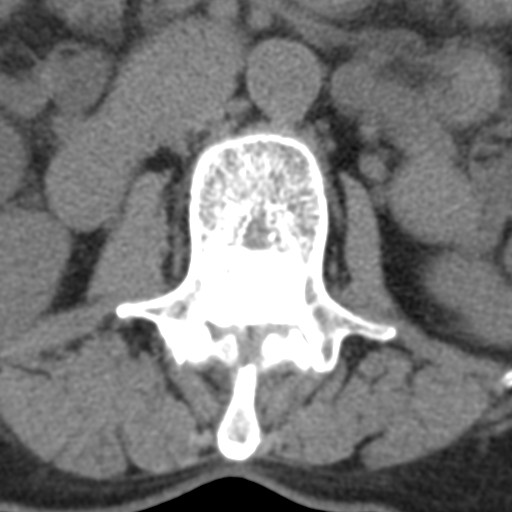
[im 52/73  bone]
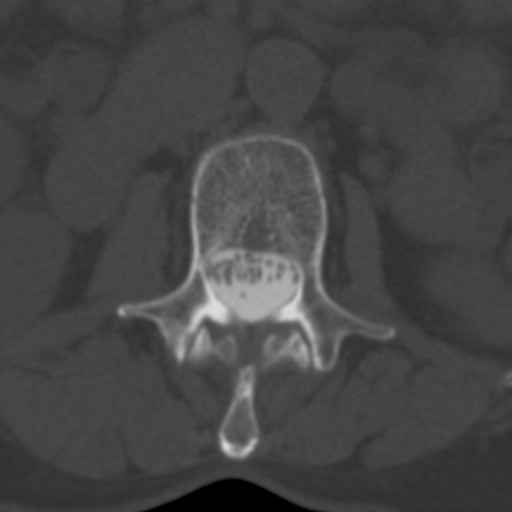
[im 62/73  bone]
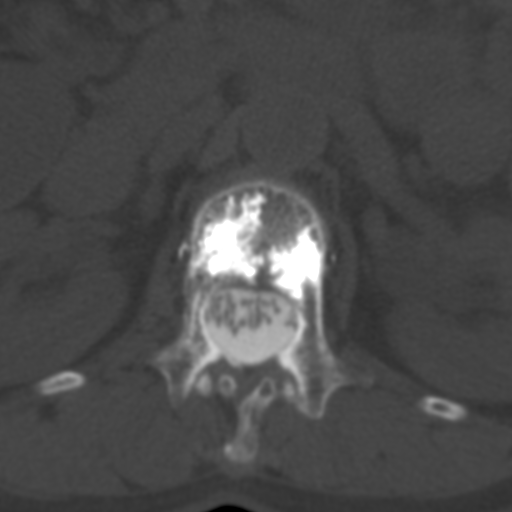

[6 of 14 positions shown; findings below may reference images not displayed]

EXAM:
LUMBAR MYELOGRAM

FLUOROSCOPY TIME:  36 seconds corresponding to a Dose Area Product
of 204 ?Gy*m2

PROCEDURE:
After thorough discussion of risks and benefits of the procedure
including bleeding, infection, injury to nerves, blood vessels,
adjacent structures as well as headache and CSF leak, written and
oral informed consent was obtained. Consent was obtained by Dr. Jae Hyoung
Noda. Time out form was completed.

Patient was positioned prone on the fluoroscopy table. Local
anesthesia was provided with 1% lidocaine without epinephrine after
prepped and draped in the usual sterile fashion. Puncture was
performed at L5-S1 using a 3 1/2 inch 22-gauge spinal needle via
LEFT paramedian approach. Using a single pass through the dura, the
needle was placed within the thecal sac, with return of clear CSF.
15 mL of Isovue M 200 was injected into the thecal sac, with normal
opacification of the nerve roots and cauda equina consistent with
free flow within the subarachnoid space.

I personally performed the lumbar puncture and administered the
intrathecal contrast. I also personally supervised acquisition of
the myelogram images.
FINDINGS: LUMBAR MYELOGRAM FINDINGS:

Good opacification lumbar subarachnoid space. Slight extradural
defect at L4-5 on the LEFT. Shallow ventral defects at L2-3, L3-4,
L4-5, and L5-S1. Previous L1 kyphoplasty appears uncomplicated.
Anatomic alignment without dynamic instability. Mild stenosis at
L4-5 is worse with patient standing, in extension, due to ligamentum
flavum infolding.

CT LUMBAR MYELOGRAM FINDINGS:

Segmentation: Normal.

Alignment:  Normal.

Vertebrae: No worrisome osseous lesion.Healed unremarkable appearing
L1 compression fracture status post kyphoplasty.

Conus medullaris: Normal in size,   and location.

Paraspinal tissues: No evidence for hydronephrosis or paravertebral
mass.

Disc levels:

T12-L1: Shallow protrusion.  No impingement.

L1-L2:  Unremarkable.

L2-L3:  Mild bulge.  Mild facet arthropathy.  No impingement.

L3-L4: Small leftward protrusion with slight caudal down turning.
LEFT L4 nerve root impingement is possible. Mild facet arthropathy
without stenosis.

L4-L5: Shallow protrusion. Mild facet arthropathy and ligamentum
flavum hypertrophy. Mild stenosis. Subarticular zone narrowing, LEFT
greater than RIGHT. Either L5 nerve root could be affected.

L5-S1: Shallow protrusion. Mild facet arthropathy. No impingement.
IMPRESSION: LUMBAR MYELOGRAM IMPRESSION:

Mild stenosis at L4-5 is worse with patient standing in extension.

CT LUMBAR MYELOGRAM IMPRESSION:

Mild stenosis at L4-5 related to central protrusion, and posterior
element hypertrophy. Subarticular zone narrowing is worse on the
LEFT. Either L5 nerve root could be affected.

Small leftward protrusion L3-4. LEFT L4 nerve root impingement is
possible.

Shallow protrusion L5-S1 not clearly compressive.

Unremarkable appearing L1 kyphoplasty.

## 2017-07-01 IMAGING — CR DG MYELOGRAPHY LUMBAR INJ LUMBOSACRAL
4 of 5 series · 14 of 20 positions shown · non-contrast
Comparison: MRI lumbar spine 08/21/2014.

CLINICAL DATA: Low back pain. Without sciatica. History of
kyphoplasty.
TECHNIQUE: Contiguous axial images were obtained through the Lumbar spine after
the intrathecal infusion of infusion. Coronal and sagittal
reconstructions were obtained of the axial image sets.

[Series 1: vasc standard · 11 of 16 slices shown]
[im 1/16]
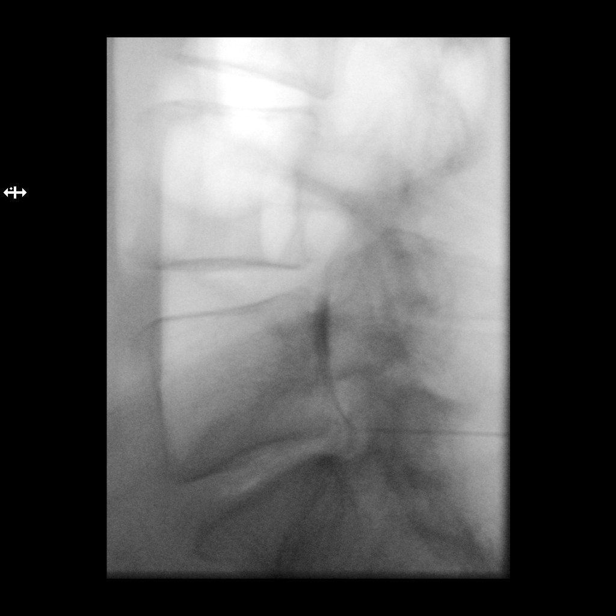
[im 3/16]
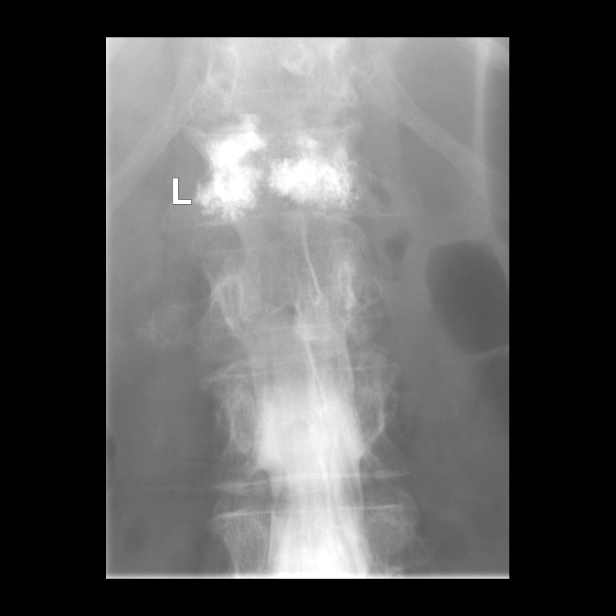
[im 4/16]
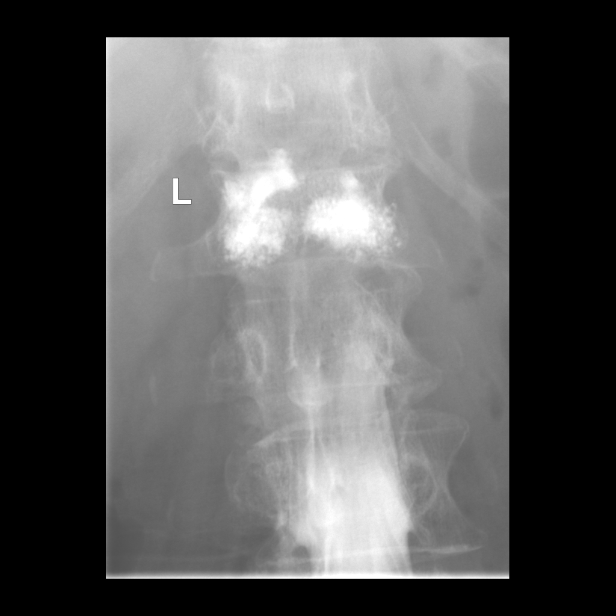
[im 6/16]
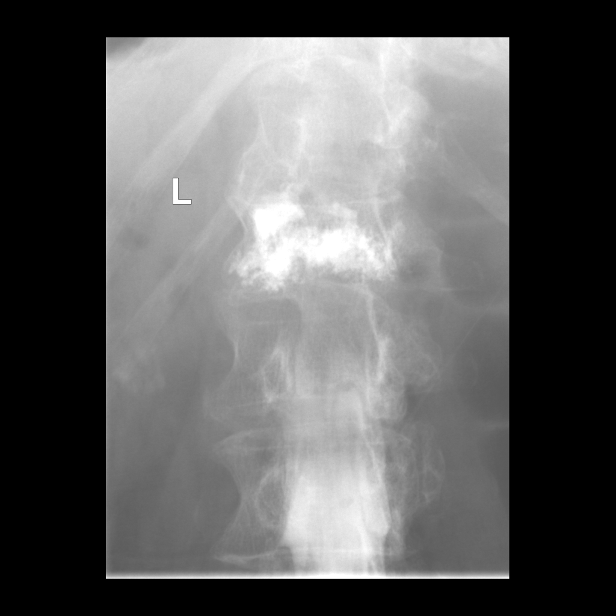
[im 7/16]
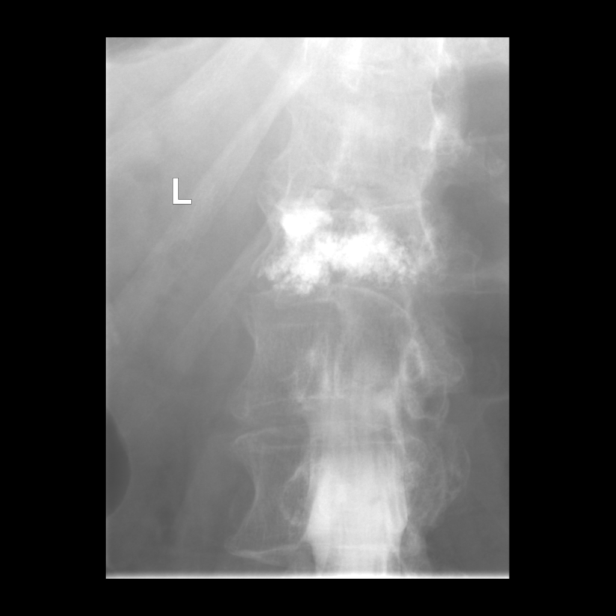
[im 8/16]
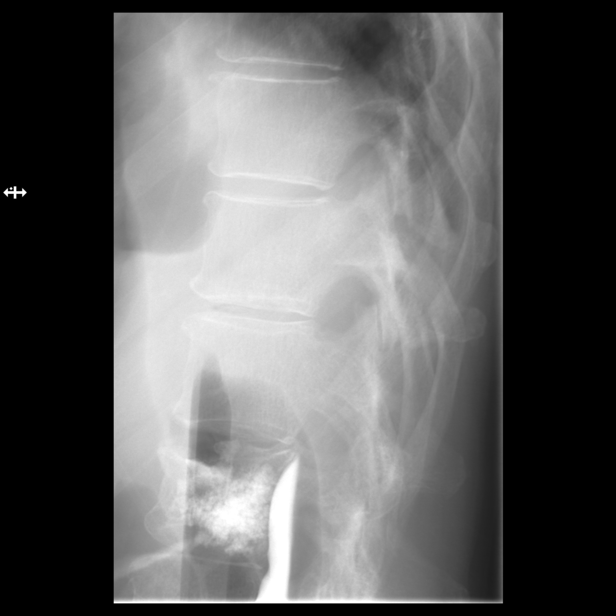
[im 10/16]
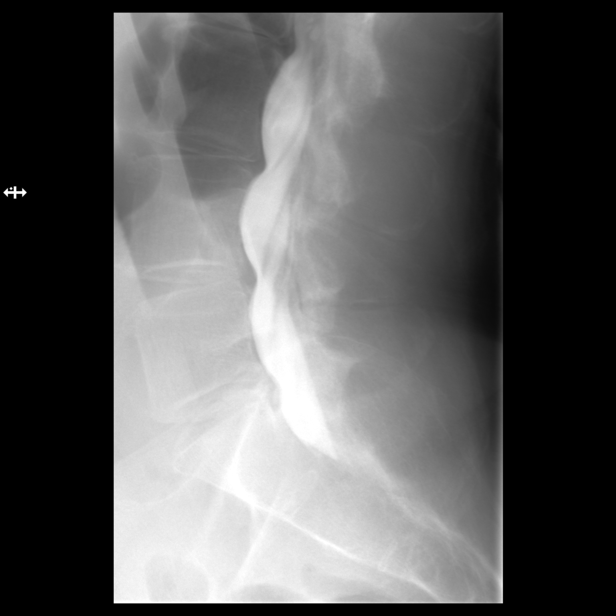
[im 11/16]
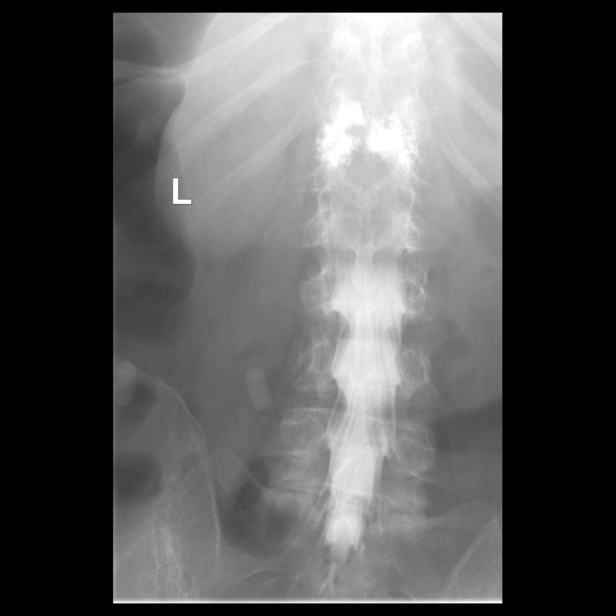
[im 13/16]
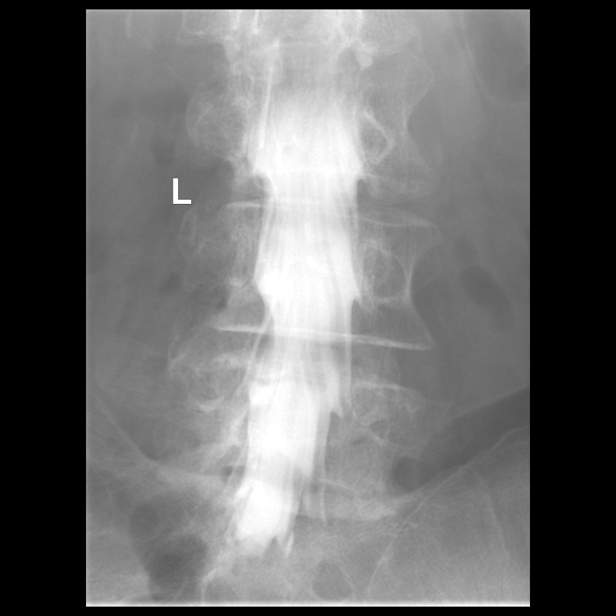
[im 14/16]
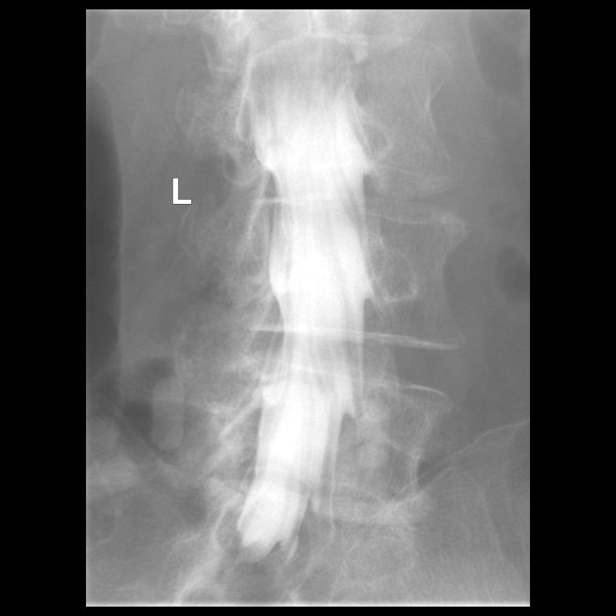
[im 16/16]
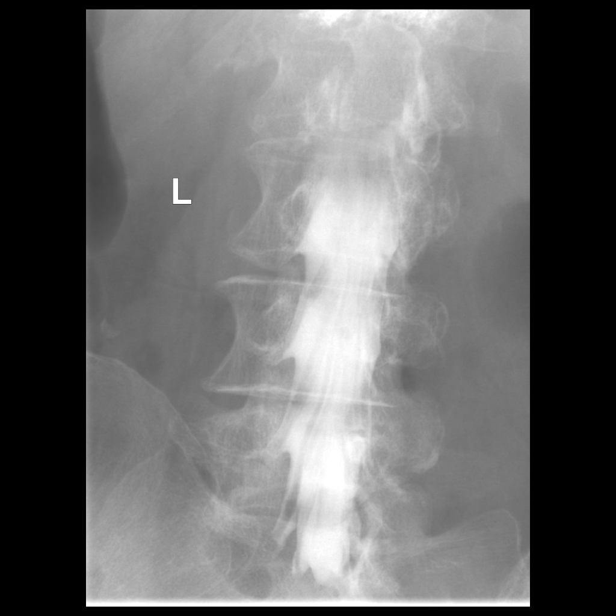

[w lumbar spine ap]
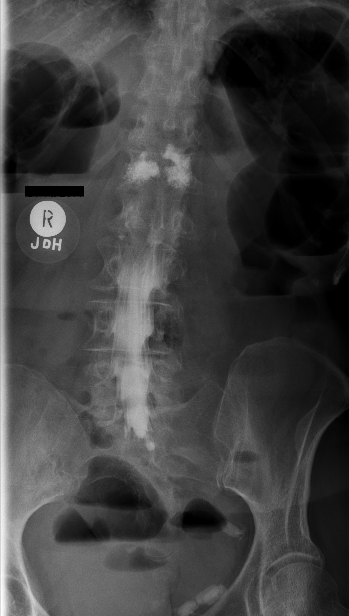

[w lumbar spine lat]
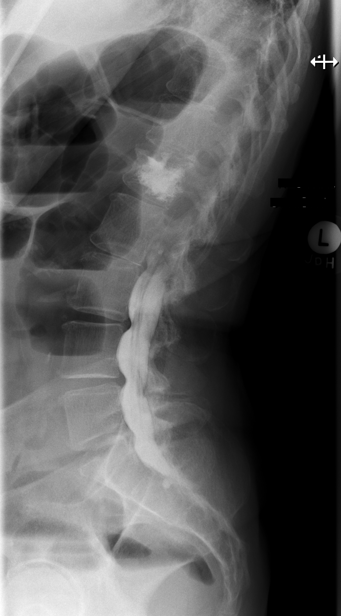

[w lumbar spine extension]
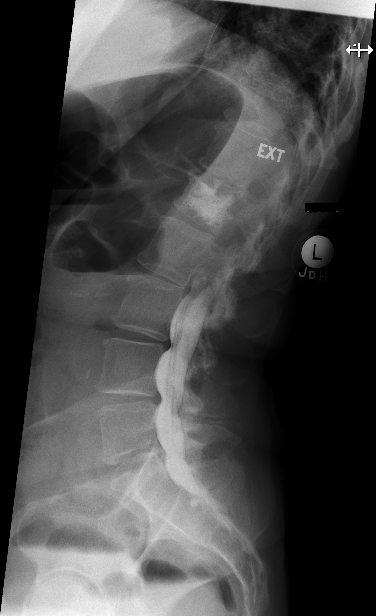

[14 of 20 positions shown; findings below may reference images not displayed]

EXAM:
LUMBAR MYELOGRAM

FLUOROSCOPY TIME:  36 seconds corresponding to a Dose Area Product
of 204 ?Gy*m2

PROCEDURE:
After thorough discussion of risks and benefits of the procedure
including bleeding, infection, injury to nerves, blood vessels,
adjacent structures as well as headache and CSF leak, written and
oral informed consent was obtained. Consent was obtained by Dr. Jae Hyoung
Noda. Time out form was completed.

Patient was positioned prone on the fluoroscopy table. Local
anesthesia was provided with 1% lidocaine without epinephrine after
prepped and draped in the usual sterile fashion. Puncture was
performed at L5-S1 using a 3 1/2 inch 22-gauge spinal needle via
LEFT paramedian approach. Using a single pass through the dura, the
needle was placed within the thecal sac, with return of clear CSF.
15 mL of Isovue M 200 was injected into the thecal sac, with normal
opacification of the nerve roots and cauda equina consistent with
free flow within the subarachnoid space.

I personally performed the lumbar puncture and administered the
intrathecal contrast. I also personally supervised acquisition of
the myelogram images.
FINDINGS: LUMBAR MYELOGRAM FINDINGS:

Good opacification lumbar subarachnoid space. Slight extradural
defect at L4-5 on the LEFT. Shallow ventral defects at L2-3, L3-4,
L4-5, and L5-S1. Previous L1 kyphoplasty appears uncomplicated.
Anatomic alignment without dynamic instability. Mild stenosis at
L4-5 is worse with patient standing, in extension, due to ligamentum
flavum infolding.

CT LUMBAR MYELOGRAM FINDINGS:

Segmentation: Normal.

Alignment:  Normal.

Vertebrae: No worrisome osseous lesion.Healed unremarkable appearing
L1 compression fracture status post kyphoplasty.

Conus medullaris: Normal in size,   and location.

Paraspinal tissues: No evidence for hydronephrosis or paravertebral
mass.

Disc levels:

T12-L1: Shallow protrusion.  No impingement.

L1-L2:  Unremarkable.

L2-L3:  Mild bulge.  Mild facet arthropathy.  No impingement.

L3-L4: Small leftward protrusion with slight caudal down turning.
LEFT L4 nerve root impingement is possible. Mild facet arthropathy
without stenosis.

L4-L5: Shallow protrusion. Mild facet arthropathy and ligamentum
flavum hypertrophy. Mild stenosis. Subarticular zone narrowing, LEFT
greater than RIGHT. Either L5 nerve root could be affected.

L5-S1: Shallow protrusion. Mild facet arthropathy. No impingement.
IMPRESSION: LUMBAR MYELOGRAM IMPRESSION:

Mild stenosis at L4-5 is worse with patient standing in extension.

CT LUMBAR MYELOGRAM IMPRESSION:

Mild stenosis at L4-5 related to central protrusion, and posterior
element hypertrophy. Subarticular zone narrowing is worse on the
LEFT. Either L5 nerve root could be affected.

Small leftward protrusion L3-4. LEFT L4 nerve root impingement is
possible.

Shallow protrusion L5-S1 not clearly compressive.

Unremarkable appearing L1 kyphoplasty.

## 2017-07-26 DIAGNOSIS — F410 Panic disorder [episodic paroxysmal anxiety] without agoraphobia: Secondary | ICD-10-CM | POA: Diagnosis not present

## 2017-07-26 DIAGNOSIS — D509 Iron deficiency anemia, unspecified: Secondary | ICD-10-CM | POA: Diagnosis not present

## 2017-08-06 DIAGNOSIS — J3489 Other specified disorders of nose and nasal sinuses: Secondary | ICD-10-CM | POA: Diagnosis not present

## 2017-08-14 DIAGNOSIS — D696 Thrombocytopenia, unspecified: Secondary | ICD-10-CM | POA: Diagnosis not present

## 2017-08-14 DIAGNOSIS — D649 Anemia, unspecified: Secondary | ICD-10-CM | POA: Diagnosis not present

## 2017-09-02 DIAGNOSIS — D649 Anemia, unspecified: Secondary | ICD-10-CM | POA: Diagnosis not present

## 2017-09-02 DIAGNOSIS — Z23 Encounter for immunization: Secondary | ICD-10-CM | POA: Diagnosis not present

## 2017-09-16 DIAGNOSIS — M47816 Spondylosis without myelopathy or radiculopathy, lumbar region: Secondary | ICD-10-CM | POA: Diagnosis not present

## 2017-10-03 DIAGNOSIS — M47816 Spondylosis without myelopathy or radiculopathy, lumbar region: Secondary | ICD-10-CM | POA: Diagnosis not present

## 2017-10-03 DIAGNOSIS — M791 Myalgia, unspecified site: Secondary | ICD-10-CM | POA: Diagnosis not present

## 2017-10-25 DIAGNOSIS — M7918 Myalgia, other site: Secondary | ICD-10-CM | POA: Diagnosis not present

## 2017-10-25 DIAGNOSIS — M5136 Other intervertebral disc degeneration, lumbar region: Secondary | ICD-10-CM | POA: Diagnosis not present

## 2017-10-25 DIAGNOSIS — M47816 Spondylosis without myelopathy or radiculopathy, lumbar region: Secondary | ICD-10-CM | POA: Diagnosis not present

## 2017-11-05 DIAGNOSIS — M5136 Other intervertebral disc degeneration, lumbar region: Secondary | ICD-10-CM | POA: Diagnosis not present

## 2017-11-05 DIAGNOSIS — M7601 Gluteal tendinitis, right hip: Secondary | ICD-10-CM | POA: Diagnosis not present

## 2017-11-05 DIAGNOSIS — M7062 Trochanteric bursitis, left hip: Secondary | ICD-10-CM | POA: Diagnosis not present

## 2017-11-05 DIAGNOSIS — M47816 Spondylosis without myelopathy or radiculopathy, lumbar region: Secondary | ICD-10-CM | POA: Diagnosis not present

## 2017-11-05 DIAGNOSIS — K5900 Constipation, unspecified: Secondary | ICD-10-CM | POA: Diagnosis not present

## 2017-11-05 DIAGNOSIS — M7061 Trochanteric bursitis, right hip: Secondary | ICD-10-CM | POA: Diagnosis not present

## 2017-11-15 DIAGNOSIS — G5701 Lesion of sciatic nerve, right lower limb: Secondary | ICD-10-CM | POA: Diagnosis not present

## 2017-11-15 DIAGNOSIS — M47816 Spondylosis without myelopathy or radiculopathy, lumbar region: Secondary | ICD-10-CM | POA: Diagnosis not present

## 2017-12-03 DIAGNOSIS — G5701 Lesion of sciatic nerve, right lower limb: Secondary | ICD-10-CM | POA: Diagnosis not present

## 2017-12-24 DIAGNOSIS — M47816 Spondylosis without myelopathy or radiculopathy, lumbar region: Secondary | ICD-10-CM | POA: Diagnosis not present

## 2018-01-03 DIAGNOSIS — G8929 Other chronic pain: Secondary | ICD-10-CM | POA: Diagnosis not present

## 2018-01-03 DIAGNOSIS — M4306 Spondylolysis, lumbar region: Secondary | ICD-10-CM | POA: Diagnosis not present

## 2018-01-03 DIAGNOSIS — M533 Sacrococcygeal disorders, not elsewhere classified: Secondary | ICD-10-CM | POA: Diagnosis not present

## 2018-01-03 DIAGNOSIS — G894 Chronic pain syndrome: Secondary | ICD-10-CM | POA: Diagnosis not present

## 2018-01-08 DIAGNOSIS — M461 Sacroiliitis, not elsewhere classified: Secondary | ICD-10-CM | POA: Diagnosis not present

## 2018-01-13 DIAGNOSIS — M47816 Spondylosis without myelopathy or radiculopathy, lumbar region: Secondary | ICD-10-CM | POA: Diagnosis not present

## 2018-01-22 DIAGNOSIS — M545 Low back pain: Secondary | ICD-10-CM | POA: Diagnosis not present

## 2018-01-22 DIAGNOSIS — M47816 Spondylosis without myelopathy or radiculopathy, lumbar region: Secondary | ICD-10-CM | POA: Diagnosis not present

## 2018-01-22 DIAGNOSIS — M461 Sacroiliitis, not elsewhere classified: Secondary | ICD-10-CM | POA: Diagnosis not present

## 2018-01-23 DIAGNOSIS — Z01818 Encounter for other preprocedural examination: Secondary | ICD-10-CM | POA: Diagnosis not present

## 2018-01-23 DIAGNOSIS — M461 Sacroiliitis, not elsewhere classified: Secondary | ICD-10-CM | POA: Diagnosis not present

## 2018-01-23 DIAGNOSIS — M533 Sacrococcygeal disorders, not elsewhere classified: Secondary | ICD-10-CM | POA: Diagnosis not present

## 2018-01-23 DIAGNOSIS — Z01810 Encounter for preprocedural cardiovascular examination: Secondary | ICD-10-CM | POA: Diagnosis not present

## 2018-01-27 DIAGNOSIS — M47816 Spondylosis without myelopathy or radiculopathy, lumbar region: Secondary | ICD-10-CM | POA: Diagnosis present

## 2018-01-27 DIAGNOSIS — M461 Sacroiliitis, not elsewhere classified: Secondary | ICD-10-CM | POA: Diagnosis not present

## 2018-01-27 DIAGNOSIS — M545 Low back pain: Secondary | ICD-10-CM | POA: Diagnosis not present

## 2018-01-27 DIAGNOSIS — M5431 Sciatica, right side: Secondary | ICD-10-CM | POA: Diagnosis present

## 2018-01-27 DIAGNOSIS — K219 Gastro-esophageal reflux disease without esophagitis: Secondary | ICD-10-CM | POA: Diagnosis present

## 2018-01-27 DIAGNOSIS — Z79899 Other long term (current) drug therapy: Secondary | ICD-10-CM | POA: Diagnosis not present

## 2018-02-24 DIAGNOSIS — M549 Dorsalgia, unspecified: Secondary | ICD-10-CM | POA: Diagnosis not present

## 2018-03-18 DIAGNOSIS — M7918 Myalgia, other site: Secondary | ICD-10-CM | POA: Diagnosis not present

## 2018-04-17 DIAGNOSIS — M47816 Spondylosis without myelopathy or radiculopathy, lumbar region: Secondary | ICD-10-CM | POA: Diagnosis not present

## 2018-04-23 ENCOUNTER — Other Ambulatory Visit: Payer: Self-pay | Admitting: Orthopaedic Surgery

## 2018-04-23 DIAGNOSIS — M545 Low back pain, unspecified: Secondary | ICD-10-CM

## 2018-05-04 ENCOUNTER — Ambulatory Visit
Admission: RE | Admit: 2018-05-04 | Discharge: 2018-05-04 | Disposition: A | Discharge status: Home / Self Care | Payer: Medicare Other | Source: Ambulatory Visit | Attending: Orthopaedic Surgery | Admitting: Orthopaedic Surgery

## 2018-05-04 DIAGNOSIS — M545 Low back pain, unspecified: Secondary | ICD-10-CM

## 2018-05-04 DIAGNOSIS — M48061 Spinal stenosis, lumbar region without neurogenic claudication: Secondary | ICD-10-CM | POA: Diagnosis not present

## 2018-05-15 DIAGNOSIS — M4716 Other spondylosis with myelopathy, lumbar region: Secondary | ICD-10-CM | POA: Diagnosis not present

## 2018-05-15 DIAGNOSIS — Z6820 Body mass index (BMI) 20.0-20.9, adult: Secondary | ICD-10-CM | POA: Diagnosis not present

## 2018-05-15 DIAGNOSIS — M961 Postlaminectomy syndrome, not elsewhere classified: Secondary | ICD-10-CM | POA: Diagnosis not present

## 2018-05-22 DIAGNOSIS — M5136 Other intervertebral disc degeneration, lumbar region: Secondary | ICD-10-CM | POA: Diagnosis not present

## 2018-05-22 DIAGNOSIS — M4716 Other spondylosis with myelopathy, lumbar region: Secondary | ICD-10-CM | POA: Diagnosis not present

## 2018-05-22 DIAGNOSIS — M545 Low back pain: Secondary | ICD-10-CM | POA: Diagnosis not present

## 2018-05-22 DIAGNOSIS — Z6820 Body mass index (BMI) 20.0-20.9, adult: Secondary | ICD-10-CM | POA: Diagnosis not present

## 2018-05-28 DIAGNOSIS — M47819 Spondylosis without myelopathy or radiculopathy, site unspecified: Secondary | ICD-10-CM | POA: Diagnosis not present

## 2018-05-28 DIAGNOSIS — Z01812 Encounter for preprocedural laboratory examination: Secondary | ICD-10-CM | POA: Diagnosis not present

## 2018-06-02 DIAGNOSIS — M961 Postlaminectomy syndrome, not elsewhere classified: Secondary | ICD-10-CM | POA: Diagnosis not present

## 2018-06-02 DIAGNOSIS — M4726 Other spondylosis with radiculopathy, lumbar region: Secondary | ICD-10-CM | POA: Diagnosis not present

## 2018-06-02 DIAGNOSIS — M4807 Spinal stenosis, lumbosacral region: Secondary | ICD-10-CM | POA: Diagnosis not present

## 2018-06-02 DIAGNOSIS — M5136 Other intervertebral disc degeneration, lumbar region: Secondary | ICD-10-CM | POA: Diagnosis not present

## 2018-06-02 DIAGNOSIS — M4716 Other spondylosis with myelopathy, lumbar region: Secondary | ICD-10-CM | POA: Diagnosis not present

## 2018-06-02 DIAGNOSIS — M47896 Other spondylosis, lumbar region: Secondary | ICD-10-CM | POA: Diagnosis not present

## 2018-06-02 DIAGNOSIS — Z23 Encounter for immunization: Secondary | ICD-10-CM | POA: Diagnosis not present

## 2018-06-02 DIAGNOSIS — Z981 Arthrodesis status: Secondary | ICD-10-CM | POA: Diagnosis not present

## 2018-06-03 DIAGNOSIS — M4807 Spinal stenosis, lumbosacral region: Secondary | ICD-10-CM | POA: Diagnosis not present

## 2018-06-03 DIAGNOSIS — Z23 Encounter for immunization: Secondary | ICD-10-CM | POA: Diagnosis not present

## 2018-06-03 DIAGNOSIS — M4726 Other spondylosis with radiculopathy, lumbar region: Secondary | ICD-10-CM | POA: Diagnosis not present

## 2018-06-03 DIAGNOSIS — M961 Postlaminectomy syndrome, not elsewhere classified: Secondary | ICD-10-CM | POA: Diagnosis not present

## 2018-06-03 DIAGNOSIS — M5136 Other intervertebral disc degeneration, lumbar region: Secondary | ICD-10-CM | POA: Diagnosis not present

## 2018-11-19 ENCOUNTER — Other Ambulatory Visit: Payer: Self-pay | Admitting: Orthopaedic Surgery

## 2018-11-19 DIAGNOSIS — M7918 Myalgia, other site: Secondary | ICD-10-CM

## 2018-12-01 ENCOUNTER — Other Ambulatory Visit: Payer: Self-pay

## 2018-12-01 ENCOUNTER — Ambulatory Visit
Admission: RE | Admit: 2018-12-01 | Discharge: 2018-12-01 | Disposition: A | Discharge status: Home / Self Care | Payer: Medicare Other | Source: Ambulatory Visit | Attending: Orthopaedic Surgery | Admitting: Orthopaedic Surgery

## 2018-12-01 DIAGNOSIS — M7918 Myalgia, other site: Secondary | ICD-10-CM

## 2019-01-13 ENCOUNTER — Other Ambulatory Visit: Payer: Self-pay | Admitting: Orthopaedic Surgery

## 2019-01-13 DIAGNOSIS — M461 Sacroiliitis, not elsewhere classified: Secondary | ICD-10-CM

## 2019-01-26 ENCOUNTER — Ambulatory Visit
Admission: RE | Admit: 2019-01-26 | Discharge: 2019-01-26 | Disposition: A | Discharge status: Home / Self Care | Payer: Medicare Other | Source: Ambulatory Visit | Attending: Orthopaedic Surgery | Admitting: Orthopaedic Surgery

## 2019-01-26 DIAGNOSIS — M461 Sacroiliitis, not elsewhere classified: Secondary | ICD-10-CM

## 2019-08-24 ENCOUNTER — Ambulatory Visit: Payer: Medicare Other

## 2019-09-23 ENCOUNTER — Other Ambulatory Visit: Payer: Self-pay | Admitting: Rehabilitation

## 2019-09-23 DIAGNOSIS — M47816 Spondylosis without myelopathy or radiculopathy, lumbar region: Secondary | ICD-10-CM

## 2019-10-06 ENCOUNTER — Ambulatory Visit
Admission: RE | Admit: 2019-10-06 | Discharge: 2019-10-06 | Disposition: A | Discharge status: Home / Self Care | Payer: Medicare Other | Source: Ambulatory Visit | Attending: Rehabilitation | Admitting: Rehabilitation

## 2019-10-06 DIAGNOSIS — M47816 Spondylosis without myelopathy or radiculopathy, lumbar region: Secondary | ICD-10-CM

## 2019-10-15 ENCOUNTER — Other Ambulatory Visit: Payer: Self-pay | Admitting: Orthopaedic Surgery

## 2019-10-15 DIAGNOSIS — R102 Pelvic and perineal pain: Secondary | ICD-10-CM

## 2019-11-12 ENCOUNTER — Ambulatory Visit
Admission: RE | Admit: 2019-11-12 | Discharge: 2019-11-12 | Disposition: A | Discharge status: Home / Self Care | Payer: Medicare Other | Source: Ambulatory Visit | Attending: Orthopaedic Surgery | Admitting: Orthopaedic Surgery

## 2019-11-12 DIAGNOSIS — R102 Pelvic and perineal pain: Secondary | ICD-10-CM

## 2020-07-30 ENCOUNTER — Emergency Department (HOSPITAL_COMMUNITY): Payer: Medicare Other

## 2020-07-30 ENCOUNTER — Encounter (HOSPITAL_COMMUNITY): Payer: Self-pay | Admitting: Emergency Medicine

## 2020-07-30 ENCOUNTER — Other Ambulatory Visit: Payer: Self-pay

## 2020-07-30 ENCOUNTER — Emergency Department (HOSPITAL_COMMUNITY)
Admission: EM | Admit: 2020-07-30 | Discharge: 2020-07-30 | Disposition: A | Discharge status: Home / Self Care | Payer: Medicare Other | Attending: Emergency Medicine | Admitting: Emergency Medicine

## 2020-07-30 DIAGNOSIS — B9689 Other specified bacterial agents as the cause of diseases classified elsewhere: Secondary | ICD-10-CM | POA: Diagnosis not present

## 2020-07-30 DIAGNOSIS — E876 Hypokalemia: Secondary | ICD-10-CM | POA: Diagnosis not present

## 2020-07-30 DIAGNOSIS — Z20822 Contact with and (suspected) exposure to covid-19: Secondary | ICD-10-CM | POA: Diagnosis not present

## 2020-07-30 DIAGNOSIS — N390 Urinary tract infection, site not specified: Secondary | ICD-10-CM | POA: Insufficient documentation

## 2020-07-30 DIAGNOSIS — R296 Repeated falls: Secondary | ICD-10-CM | POA: Diagnosis not present

## 2020-07-30 DIAGNOSIS — R5383 Other fatigue: Secondary | ICD-10-CM

## 2020-07-30 DIAGNOSIS — R0602 Shortness of breath: Secondary | ICD-10-CM | POA: Diagnosis present

## 2020-07-30 LAB — URINALYSIS, ROUTINE W REFLEX MICROSCOPIC
Bilirubin Urine: NEGATIVE
Glucose, UA: NEGATIVE mg/dL
Hgb urine dipstick: NEGATIVE
Ketones, ur: NEGATIVE mg/dL
Nitrite: POSITIVE — AB
Protein, ur: NEGATIVE mg/dL
Specific Gravity, Urine: 1.005 (ref 1.005–1.030)
WBC, UA: >50 WBC/hpf — ABNORMAL HIGH (ref 0–5)
pH: 7 (ref 5.0–8.0)

## 2020-07-30 LAB — RESP PANEL BY RT-PCR (FLU A&B, COVID) ARPGX2
Influenza A by PCR: NEGATIVE
Influenza B by PCR: NEGATIVE
SARS Coronavirus 2 by RT PCR: NEGATIVE

## 2020-07-30 LAB — BASIC METABOLIC PANEL
Anion gap: 11 (ref 5–15)
BUN: 17 mg/dL (ref 8–23)
CO2: 33 mmol/L — ABNORMAL HIGH (ref 22–32)
Calcium: 9 mg/dL (ref 8.9–10.3)
Chloride: 92 mmol/L — ABNORMAL LOW (ref 98–111)
Creatinine, Ser: 2.07 mg/dL — ABNORMAL HIGH (ref 0.44–1.00)
GFR, Estimated: 25 mL/min — ABNORMAL LOW (ref >60)
Glucose, Bld: 108 mg/dL — ABNORMAL HIGH (ref 70–99)
Potassium: 3 mmol/L — ABNORMAL LOW (ref 3.5–5.1)
Sodium: 136 mmol/L (ref 135–145)

## 2020-07-30 LAB — CBC
HCT: 36.8 % (ref 36.0–46.0)
Hemoglobin: 11.7 g/dL — ABNORMAL LOW (ref 12.0–15.0)
MCH: 28.7 pg (ref 26.0–34.0)
MCHC: 31.8 g/dL (ref 30.0–36.0)
MCV: 90.2 fL (ref 80.0–100.0)
Platelets: 155 10*3/uL (ref 150–400)
RBC: 4.08 MIL/uL (ref 3.87–5.11)
RDW: 14.6 % (ref 11.5–15.5)
WBC: 5.4 10*3/uL (ref 4.0–10.5)
nRBC: 0 % (ref 0.0–0.2)

## 2020-07-30 LAB — TROPONIN I (HIGH SENSITIVITY): Troponin I (High Sensitivity): 3 ng/L (ref <18)

## 2020-07-30 MED ORDER — CEPHALEXIN 500 MG PO CAPS: 500.0000 mg | ORAL_CAPSULE | Freq: Two times a day (BID) | ORAL | 0 refills | Status: DC

## 2020-07-30 MED ORDER — SODIUM CHLORIDE 0.9 % IV BOLUS
1000.0000 mL | Freq: Once | INTRAVENOUS | Status: AC
  Administered 2020-07-30: 1000 mL via INTRAVENOUS

## 2020-07-30 MED ORDER — SODIUM CHLORIDE 0.9 % IV SOLN
1.0000 g | Freq: Once | INTRAVENOUS | Status: AC
  Administered 2020-07-30: 1 g via INTRAVENOUS
  Filled 2020-07-30: qty 10

## 2020-07-30 MED ORDER — POTASSIUM CHLORIDE CRYS ER 20 MEQ PO TBCR
20.0000 meq | EXTENDED_RELEASE_TABLET | Freq: Once | ORAL | Status: AC
  Administered 2020-07-30: 20 meq via ORAL
  Filled 2020-07-30: qty 1

## 2020-07-30 NOTE — ED Provider Notes (Signed)
[facility]-EMERGENCY DEPT Provider Note   CSN: [id number] Arrival date & time: 07/30/20  1656     History Chief Complaint  Patient presents with  . Fatigue  . Shortness of Breath    [name] is a 75 y.o. female.  She has a history of chronic pain, reflux, anemia.  She is complaining of 1 week of shortness of breath with exertion, fatigue, feeling weak in her legs, and having some falls.  Denies any cough fever vomiting diarrhea or urinary symptoms.  She feels she is eating and drinking well.  She is COVID vaccinated.  The history is provided by the patient.  Shortness of Breath Severity:  Moderate Onset quality:  Gradual Duration:  1 week Timing:  Intermittent Progression:  Unchanged Chronicity:  New Context: activity   Relieved by:  Rest Worsened by:  Activity Ineffective treatments:  None tried Associated symptoms: no abdominal pain, no chest pain, no cough, no fever, no headaches, no rash, no sore throat, no sputum production and no vomiting        Past Medical History:  Diagnosis Date  . Anxiety   . Chronic pain   . Constipation   . GERD (gastroesophageal reflux disease)   . History of blood transfusion   . Migraine    last one since 05/2015  . Pneumonia 2014ish    Patient Active Problem List   Diagnosis Date Noted  . Altered mental status 09/30/2015  . Fever 09/30/2015  . Insomnia 09/30/2015  . Fever and chills 09/30/2015  . Lumbar stenosis 09/27/2015  . Gastro-esophageal reflux disease without esophagitis 07/15/2015  . Adaptive colitis 07/15/2015  . Incomplete bladder emptying 07/15/2015  . Excessive urination at night 07/15/2015  . Nasal septal perforation 07/15/2015  . Polyneuropathy, postherpetic 07/15/2015  . Compression fracture 09/01/2014  . Pars defect 04/24/2014  . Pneumonia 04/11/2014  . Constipation due to opioid therapy 01/29/2014  . Benzodiazepine overdose, unintentional 05/26/2013  . Polypharmacy 05/24/2013   . Hypokalemia 05/24/2013  . Unstable gait 05/24/2013  . Anxiety 05/24/2013  . Chronic pain 05/24/2013  . Clinical depression 08/08/2012  . Ilioinguinal neuralgia 04/15/2012  . Abdominal pain 02/06/2012  . Continuous opioid dependence (HCC) 02/06/2012    Past Surgical History:  Procedure Laterality Date  . ABDOMINAL WALL MESH  REMOVAL    . APPENDECTOMY    . BLADDER SURGERY     Mesh implanted  . CATARACT EXTRACTION W/ INTRAOCULAR LENS  IMPLANT, BILATERAL Bilateral   . COLONOSCOPY    . KYPHOPLASTY N/A 09/01/2014   Procedure: KYPHOPLASTY;  Surgeon: Emilee Hero, MD;  Location: [facility] OR;  Service: Orthopedics;  Laterality: N/A;  Lumbar 1 kyphoplasty  . LAMINECTOMY WITH POSTERIOR LATERAL ARTHRODESIS LEVEL 1 N/A 09/27/2015   Procedure: L3-4, 4-5 Laminectomy with posterior lateral arthrodesis;  Surgeon: Hilda Lias, MD;  Location: [facility] NEURO ORS;  Service: Neurosurgery;  Laterality: N/A;  L 4-5 Laminectomy with posterior lateral arthrodesis, partial L3 laminectomy  . OOPHORECTOMY Right   . SHOULDER SURGERY Left    X 2  . TONSILLECTOMY       OB History   No obstetric history on file.     Family History  Problem Relation Age of Onset  . Coronary artery disease Father 54  . Coronary artery disease Mother 47    Social History   Tobacco Use  . Smoking status: Never Smoker  Substance Use Topics  . Alcohol use: No  . Drug use: No    Home  Medications Prior to Admission medications   Medication Sig Start Date End Date Taking? Authorizing Provider  ALPRAZolam Prudy Feeler) 1 MG tablet Take 1 mg by mouth 3 (three) times daily as needed for anxiety. Also takes daily prn    [provider]  gabapentin (NEURONTIN) 800 MG tablet Take 800 mg by mouth 4 (four) times daily. 09/16/15   [provider]  omeprazole (PRILOSEC) 20 MG capsule Take 20 mg by mouth daily.     [provider]  polyethylene glycol (MIRALAX / GLYCOLAX) packet Take 17 g by mouth 2 (two) times  daily as needed. Patient taking differently: Take 17 g by mouth daily.  04/13/14   Zannie Cove, MD  QUEtiapine (SEROQUEL) 300 MG tablet Take 600 mg by mouth at bedtime. 09/19/15   [provider]  sennosides-docusate sodium (SENOKOT-S) 8.6-50 MG tablet Take 4 tablets by mouth at bedtime.    [provider]  topiramate (TOPAMAX) 25 MG tablet Take 25 mg by mouth at bedtime. 08/25/15   [provider]  traZODone (DESYREL) 100 MG tablet Take 400 mg by mouth at bedtime.  01/31/13   [provider]  verapamil (CALAN) 120 MG tablet Take 120 mg by mouth at bedtime. 09/05/15   [provider]    Allergies    Sulfa antibiotics  Review of Systems   Review of Systems  Constitutional: Positive for fatigue. Negative for fever.  HENT: Negative for sore throat.   Eyes: Negative for visual disturbance.  Respiratory: Positive for shortness of breath. Negative for cough and sputum production.   Cardiovascular: Negative for chest pain.  Gastrointestinal: Negative for abdominal pain and vomiting.  Genitourinary: Negative for dysuria.  Musculoskeletal: Positive for gait problem.  Skin: Negative for rash.  Neurological: Negative for headaches.    Physical Exam Updated Vital Signs BP 126/78 (BP Location: Left Arm)   Pulse 82   Temp 98.3 F (36.8 C) (Oral)   Resp 16   Ht 5\' 4"  (1.626 m)   Wt 50.8 kg   SpO2 97%   BMI 19.22 kg/m   Physical Exam Vitals and nursing note reviewed.  Constitutional:      General: She is not in acute distress.    Appearance: She is well-developed and well-nourished.  HENT:     Head: Normocephalic and atraumatic.  Eyes:     Conjunctiva/sclera: Conjunctivae normal.  Cardiovascular:     Rate and Rhythm: Normal rate and regular rhythm.     Heart sounds: No murmur heard.   Pulmonary:     Effort: Pulmonary effort is normal. No respiratory distress.     Breath sounds: Normal breath sounds.  Abdominal:     Palpations: Abdomen  is soft.     Tenderness: There is no abdominal tenderness.  Musculoskeletal:        General: No edema. Normal range of motion.     Cervical back: Neck supple.     Right lower leg: No tenderness. No edema.     Left lower leg: No tenderness. No edema.  Skin:    General: Skin is warm and dry.     Capillary Refill: Capillary refill takes less than 2 seconds.  Neurological:     General: No focal deficit present.     Mental Status: She is alert and oriented to person, place, and time.     Cranial Nerves: No cranial nerve deficit.     Sensory: No sensory deficit.     Motor: No weakness.  Gait: Gait normal.  Psychiatric:        Mood and Affect: Mood and affect normal.     ED Results / Procedures / Treatments   Labs (all labs ordered are listed, but only abnormal results are displayed) Labs Reviewed  BASIC METABOLIC PANEL - Abnormal; Notable for the following components:      Result Value   Potassium 3.0 (*)    Chloride 92 (*)    CO2 33 (*)    Glucose, Bld 108 (*)    Creatinine, Ser 2.07 (*)    GFR, Estimated 25 (*)    All other components within normal limits  CBC - Abnormal; Notable for the following components:   Hemoglobin 11.7 (*)    All other components within normal limits  URINALYSIS, ROUTINE W REFLEX MICROSCOPIC - Abnormal; Notable for the following components:   APPearance HAZY (*)    Nitrite POSITIVE (*)    Leukocytes,Ua MODERATE (*)    WBC, UA >50 (*)    Bacteria, UA RARE (*)    All other components within normal limits  RESP PANEL BY RT-PCR (FLU A&B, COVID) ARPGX2  TROPONIN I (HIGH SENSITIVITY)  TROPONIN I (HIGH SENSITIVITY)    EKG EKG Interpretation  Date/Time:  Saturday July 30 2020 21:01:07 EST Ventricular Rate:  68 PR Interval:    QRS Duration: 105 QT Interval:  492 QTC Calculation: 524 R Axis:   49 Text Interpretation: Sinus rhythm Borderline T abnormalities, anterior leads Prolonged QT interval qtc new from prior 3/17 Confirmed by Meridee Score [phone]) on 07/30/2020 9:49:58 PM   Radiology DG Chest 2 View  Result Date: 07/30/2020 CLINICAL DATA:  Shortness of breath. EXAM: CHEST - 2 VIEW COMPARISON:  September 30, 2015. FINDINGS: The heart size and mediastinal contours are within normal limits. Both lungs are clear. No pneumothorax or pleural effusion is noted. The visualized skeletal structures are unremarkable. IMPRESSION: No active cardiopulmonary disease. Electronically Signed   By: Lupita Raider M.D.   On: 07/30/2020 20:25    Procedures Procedures (including critical care time)  Medications Ordered in ED Medications  sodium chloride 0.9 % bolus 1,000 mL (0 mLs Intravenous Stopped 07/30/20 2203)  potassium chloride SA (KLOR-CON) CR tablet 20 mEq (20 mEq Oral Given 07/30/20 2306)  cefTRIAXone (ROCEPHIN) 1 g in sodium chloride 0.9 % 100 mL IVPB (0 g Intravenous Stopped 07/30/20 2337)    ED Course  I have reviewed the triage vital signs and the nursing notes.  Pertinent labs & imaging results that were available during my care of the patient were reviewed by me and considered in my medical decision making (see chart for details).  Clinical Course as of 07/31/20 0919  Sat Jul 30, 2020  2145 EKG is normal sinus rhythm rate of 68 nonspecific T wave flattening prolonged QTC [MB]  2245 Reviewed results with patient.  She is agreeable with plan for dose of IV antibiotics here and discharged home.  We will send her out on oral antibiotics.  Close follow-up with PCP. [MB]    Clinical Course User Index [MB] Terrilee Files, MD   MDM Rules/Calculators/A&P                         [name] was evaluated in Emergency Department on 07/30/2020 for the symptoms described in the history of present illness. She was evaluated in the context of the global COVID-19 pandemic, which necessitated consideration that the patient might be  at risk for infection with the SARS-CoV-2 virus that causes COVID-19. Institutional protocols and  algorithms that pertain to the evaluation of patients at risk for COVID-19 are in a state of rapid change based on information released by regulatory bodies including the CDC and federal and state organizations. These policies and algorithms were followed during the patient's care in the ED.  This patient complains of generalized weakness fatigue shortness of breath falls; this involves an extensive number of treatment Options and is a complaint that carries with it a high risk of complications and Morbidity. The differential includes pneumonia, anemia, CHF, metabolic derangement, COVID, infection  I ordered, reviewed and interpreted labs, which included CBC with normal white count, hemoglobin low but better than baseline, chemistries with low potassium elevated creatinine reflecting some dehydration COVID testing and flu testing negative, urinalysis with signs of infection greater than 50 white blood cells, nitrite positive I ordered medication IV fluids, oral potassium, IV antibiotics I ordered imaging studies which included chest x-ray and I independently    visualized and interpreted imaging which showed no acute infiltrates  Previous records obtained and reviewed in epic, no recent admissions  After the interventions stated above, I reevaluated the patient and found patient to be feeling somewhat improved.  Reviewed results of lab work with her.  She is comfortable plan for discharge on oral antibiotics and close follow-up with PCP.  Return instructions discussed.   Final Clinical Impression(s) / ED Diagnoses Final diagnoses:  Fatigue, unspecified type  Hypokalemia  Urinary tract infection without hematuria, site unspecified    Rx / DC Orders ED Discharge Orders         Ordered    cephALEXin (KEFLEX) 500 MG capsule  2 times daily        07/30/20 2246           Terrilee Files, MD 07/31/20 [phone]

## 2020-07-30 NOTE — Discharge Instructions (Signed)
You were seen in the emergency department for fatigue shortness of breath generalized weakness.  You had blood work EKG chest x-ray and COVID testing.  Your potassium was mildly low and you have signs of a urinary tract infection.  You were given fluids and IV antibiotics.  We sent a prescription for antibiotics to your pharmacy.  Please finish these and follow-up with your primary care doctor.  Return to the emergency department for any worsening or concerning symptoms.  Please try to stay well-hydrated.

## 2020-07-30 NOTE — ED Triage Notes (Signed)
Patient c/o SOB and fatigue x4 days. Denies cough, fever, N/V/D.

## 2020-07-31 LAB — TROPONIN I (HIGH SENSITIVITY): Troponin I (High Sensitivity): 2 ng/L (ref <18)

## 2020-08-15 ENCOUNTER — Emergency Department (HOSPITAL_COMMUNITY): Payer: Medicare Other

## 2020-08-15 ENCOUNTER — Other Ambulatory Visit: Payer: Self-pay

## 2020-08-15 ENCOUNTER — Encounter (HOSPITAL_COMMUNITY): Payer: Self-pay

## 2020-08-15 ENCOUNTER — Emergency Department (HOSPITAL_COMMUNITY)
Admission: EM | Admit: 2020-08-15 | Discharge: 2020-08-15 | Disposition: A | Discharge status: Home / Self Care | Payer: Medicare Other | Attending: Emergency Medicine | Admitting: Emergency Medicine

## 2020-08-15 DIAGNOSIS — K219 Gastro-esophageal reflux disease without esophagitis: Secondary | ICD-10-CM | POA: Diagnosis not present

## 2020-08-15 DIAGNOSIS — U071 COVID-19: Secondary | ICD-10-CM | POA: Diagnosis not present

## 2020-08-15 DIAGNOSIS — R1084 Generalized abdominal pain: Secondary | ICD-10-CM

## 2020-08-15 DIAGNOSIS — R109 Unspecified abdominal pain: Secondary | ICD-10-CM | POA: Diagnosis present

## 2020-08-15 LAB — COMPREHENSIVE METABOLIC PANEL
ALT: 10 U/L (ref 0–44)
AST: 24 U/L (ref 15–41)
Albumin: 3.5 g/dL (ref 3.5–5.0)
Alkaline Phosphatase: 87 U/L (ref 38–126)
Anion gap: 13 (ref 5–15)
BUN: 11 mg/dL (ref 8–23)
CO2: 23 mmol/L (ref 22–32)
Calcium: 9.3 mg/dL (ref 8.9–10.3)
Chloride: 101 mmol/L (ref 98–111)
Creatinine, Ser: 0.83 mg/dL (ref 0.44–1.00)
GFR, Estimated: >60 mL/min (ref >60)
Glucose, Bld: 102 mg/dL — ABNORMAL HIGH (ref 70–99)
Potassium: 3.4 mmol/L — ABNORMAL LOW (ref 3.5–5.1)
Sodium: 137 mmol/L (ref 135–145)
Total Bilirubin: 0.8 mg/dL (ref 0.3–1.2)
Total Protein: 7.2 g/dL (ref 6.5–8.1)

## 2020-08-15 LAB — URINALYSIS, ROUTINE W REFLEX MICROSCOPIC
Bilirubin Urine: NEGATIVE
Glucose, UA: NEGATIVE mg/dL
Hgb urine dipstick: NEGATIVE
Ketones, ur: 5 mg/dL — AB
Leukocytes,Ua: NEGATIVE
Nitrite: NEGATIVE
Protein, ur: NEGATIVE mg/dL
Specific Gravity, Urine: 1.017 (ref 1.005–1.030)
pH: 5 (ref 5.0–8.0)

## 2020-08-15 LAB — CBC
HCT: 34.5 % — ABNORMAL LOW (ref 36.0–46.0)
Hemoglobin: 11.5 g/dL — ABNORMAL LOW (ref 12.0–15.0)
MCH: 28.9 pg (ref 26.0–34.0)
MCHC: 33.3 g/dL (ref 30.0–36.0)
MCV: 86.7 fL (ref 80.0–100.0)
Platelets: 319 10*3/uL (ref 150–400)
RBC: 3.98 MIL/uL (ref 3.87–5.11)
RDW: 14.6 % (ref 11.5–15.5)
WBC: 6.8 10*3/uL (ref 4.0–10.5)
nRBC: 0 % (ref 0.0–0.2)

## 2020-08-15 LAB — LIPASE, BLOOD: Lipase: 25 U/L (ref 11–51)

## 2020-08-15 MED ORDER — IOHEXOL 300 MG/ML  SOLN
100.0000 mL | Freq: Once | INTRAMUSCULAR | Status: AC | PRN
  Administered 2020-08-15: 100 mL via INTRAVENOUS

## 2020-08-15 MED ORDER — ONDANSETRON 4 MG PO TBDP: 4.0000 mg | ORAL_TABLET | Freq: Three times a day (TID) | ORAL | 0 refills | Status: DC | PRN

## 2020-08-15 NOTE — ED Triage Notes (Signed)
Pt presents with c/o abdominal pain that started approx one week ago. Pt reports she began vomiting 2 days.

## 2020-08-15 NOTE — Discharge Instructions (Addendum)
Call your primary care doctor or specialist as discussed in the next 2-3 days.   Return immediately back to the ER if:  Your symptoms worsen within the next 12-24 hours. You develop new symptoms such as new fevers, persistent vomiting, new pain, shortness of breath, or new weakness or numbness, or if you have any other concerns.  

## 2020-08-15 NOTE — ED Provider Notes (Signed)
Patient signout is pending CT imaging which showed no emergent pathology.  On my exam patient has mild periumbilical discomfort on exam.  Vital signs remained stable.  CT imaging of the abdomen pelvis also car parts of the lung which showed concern for groundglass opacities possible viral infection.  Covid test sent, recommending isolation until results return.  Advised me to return if unable to keep down any fluids, has worsening symptoms or if they have any additional concerns to return immediately to the ER, otherwise follow-up with your doctor in 2 or 3 days.     Cheryll Cockayne, MD 08/15/20 [id number]

## 2020-08-15 NOTE — ED Provider Notes (Signed)
[facility]-EMERGENCY DEPT Provider Note   CSN: [id number] Arrival date & time: 08/15/20  1053     History Chief Complaint  Patient presents with  . Abdominal Pain    [name] is a 75 y.o. female.  75 year old female with past medical history below including GERD, anxiety, constipation who presents with abdominal pain and vomiting.  Patient reports 1 week of progressively worsening abdominal pain.  3 days ago, she began having associated vomiting and reports daily vomiting since it began.  She reports relatively normal bowel movements, no diarrhea or constipation.  No blood in stools, dysuria, fevers, or URI symptoms.  She was evaluated in the ED approximately 2 weeks ago for fatigue.  She was diagnosed with UTI and treated with a course of antibiotics, reports symptoms have resolved.  She had an ultrasound of her abdomen back in December but states that this was for other issues, was not related to pain like she is having currently.  The history is provided by the patient.  Abdominal Pain      Past Medical History:  Diagnosis Date  . Anxiety   . Chronic pain   . Constipation   . GERD (gastroesophageal reflux disease)   . History of blood transfusion   . Migraine    last one since 05/2015  . Pneumonia 2014ish    Patient Active Problem List   Diagnosis Date Noted  . Altered mental status 09/30/2015  . Fever 09/30/2015  . Insomnia 09/30/2015  . Fever and chills 09/30/2015  . Lumbar stenosis 09/27/2015  . Gastro-esophageal reflux disease without esophagitis 07/15/2015  . Adaptive colitis 07/15/2015  . Incomplete bladder emptying 07/15/2015  . Excessive urination at night 07/15/2015  . Nasal septal perforation 07/15/2015  . Polyneuropathy, postherpetic 07/15/2015  . Compression fracture 09/01/2014  . Pars defect 04/24/2014  . Pneumonia 04/11/2014  . Constipation due to opioid therapy 01/29/2014  . Benzodiazepine overdose, unintentional  05/26/2013  . Polypharmacy 05/24/2013  . Hypokalemia 05/24/2013  . Unstable gait 05/24/2013  . Anxiety 05/24/2013  . Chronic pain 05/24/2013  . Clinical depression 08/08/2012  . Ilioinguinal neuralgia 04/15/2012  . Abdominal pain 02/06/2012  . Continuous opioid dependence (HCC) 02/06/2012    Past Surgical History:  Procedure Laterality Date  . ABDOMINAL WALL MESH  REMOVAL    . APPENDECTOMY    . BLADDER SURGERY     Mesh implanted  . CATARACT EXTRACTION W/ INTRAOCULAR LENS  IMPLANT, BILATERAL Bilateral   . COLONOSCOPY    . KYPHOPLASTY N/A 09/01/2014   Procedure: KYPHOPLASTY;  Surgeon: Emilee Hero, MD;  Location: [facility] OR;  Service: Orthopedics;  Laterality: N/A;  Lumbar 1 kyphoplasty  . LAMINECTOMY WITH POSTERIOR LATERAL ARTHRODESIS LEVEL 1 N/A 09/27/2015   Procedure: L3-4, 4-5 Laminectomy with posterior lateral arthrodesis;  Surgeon: Hilda Lias, MD;  Location: [facility] NEURO ORS;  Service: Neurosurgery;  Laterality: N/A;  L 4-5 Laminectomy with posterior lateral arthrodesis, partial L3 laminectomy  . OOPHORECTOMY Right   . SHOULDER SURGERY Left    X 2  . TONSILLECTOMY       OB History   No obstetric history on file.     Family History  Problem Relation Age of Onset  . Coronary artery disease Father 16  . Coronary artery disease Mother 51    Social History   Tobacco Use  . Smoking status: Never Smoker  Substance Use Topics  . Alcohol use: No  . Drug use: No    Home Medications  Prior to Admission medications   Medication Sig Start Date End Date Taking? Authorizing Provider  ALPRAZolam Prudy Feeler) 1 MG tablet Take 1 mg by mouth 3 (three) times daily as needed for anxiety. Also takes daily prn    [provider]  cephALEXin (KEFLEX) 500 MG capsule Take 1 capsule (500 mg total) by mouth 2 (two) times daily. 07/30/20   Terrilee Files, MD  gabapentin (NEURONTIN) 800 MG tablet Take 800 mg by mouth 4 (four) times daily. 09/16/15   [provider]   omeprazole (PRILOSEC) 20 MG capsule Take 20 mg by mouth daily.     [provider]  polyethylene glycol (MIRALAX / GLYCOLAX) packet Take 17 g by mouth 2 (two) times daily as needed. Patient taking differently: Take 17 g by mouth daily.  04/13/14   Zannie Cove, MD  QUEtiapine (SEROQUEL) 300 MG tablet Take 600 mg by mouth at bedtime. 09/19/15   [provider]  sennosides-docusate sodium (SENOKOT-S) 8.6-50 MG tablet Take 4 tablets by mouth at bedtime.    [provider]  topiramate (TOPAMAX) 25 MG tablet Take 25 mg by mouth at bedtime. 08/25/15   [provider]  traZODone (DESYREL) 100 MG tablet Take 400 mg by mouth at bedtime.  01/31/13   [provider]  verapamil (CALAN) 120 MG tablet Take 120 mg by mouth at bedtime. 09/05/15   [provider]    Allergies    Sulfa antibiotics  Review of Systems   Review of Systems  Gastrointestinal: Positive for abdominal pain.   All other systems reviewed and are negative except that which was mentioned in HPI  Physical Exam Updated Vital Signs BP 124/84   Pulse 85   Temp 98.3 F (36.8 C) (Oral)   Resp 16   SpO2 98%   Physical Exam Constitutional:      General: She is not in acute distress.    Appearance: Normal appearance.  HENT:     Head: Normocephalic and atraumatic.     Mouth/Throat:     Mouth: Mucous membranes are moist.  Eyes:     Conjunctiva/sclera: Conjunctivae normal.  Cardiovascular:     Rate and Rhythm: Normal rate and regular rhythm.     Heart sounds: Normal heart sounds. No murmur heard.   Pulmonary:     Effort: Pulmonary effort is normal.     Breath sounds: Normal breath sounds.  Abdominal:     General: Abdomen is flat. Bowel sounds are normal. There is no distension.     Palpations: Abdomen is soft.     Tenderness: There is abdominal tenderness in the epigastric area. There is no guarding or rebound.  Musculoskeletal:     Right lower leg: No edema.     Left  lower leg: No edema.  Skin:    General: Skin is warm and dry.  Neurological:     Mental Status: She is alert and oriented to person, place, and time.     Comments: Fluent speech  Psychiatric:        Mood and Affect: Mood normal.        Behavior: Behavior normal.     ED Results / Procedures / Treatments   Labs (all labs ordered are listed, but only abnormal results are displayed) Labs Reviewed  COMPREHENSIVE METABOLIC PANEL - Abnormal; Notable for the following components:      Result Value   Potassium 3.4 (*)    Glucose, Bld 102 (*)    All other components  within normal limits  CBC - Abnormal; Notable for the following components:   Hemoglobin 11.5 (*)    HCT 34.5 (*)    All other components within normal limits  URINALYSIS, ROUTINE W REFLEX MICROSCOPIC - Abnormal; Notable for the following components:   Color, Urine AMBER (*)    Ketones, ur 5 (*)    All other components within normal limits  LIPASE, BLOOD    EKG None  Radiology No results found.  Procedures Procedures   Medications Ordered in ED Medications - No data to display  ED Course  I have reviewed the triage vital signs and the nursing notes.  Pertinent labs & imaging results that were available during my care of the patient were reviewed by me and considered in my medical decision making (see chart for details).    MDM Rules/Calculators/A&P                         Well-appearing on exam, normal vital signs, hyperactive and high-pitched bowel sounds with epigastric tenderness noted.  Differential includes pancreatitis, bowel obstruction, gallbladder pathology.  LAbs show no evidence of UTI, normal CMP and lipase, normal WBC count. I have ordered CT abd/pelvis. Pt signed out to oncoming provider pending imaging results. Final Clinical Impression(s) / ED Diagnoses Final diagnoses:  None    Rx / DC Orders ED Discharge Orders    None       Tensley Wery, Ambrose Finland, MD 08/15/20 1440

## 2020-08-15 NOTE — ED Notes (Signed)
Pt ambulatory to restroom w/out assistance.  

## 2020-08-16 LAB — SARS CORONAVIRUS 2 (TAT 6-24 HRS): SARS Coronavirus 2: POSITIVE — AB

## 2020-08-17 ENCOUNTER — Telehealth: Payer: Self-pay

## 2020-08-17 NOTE — Telephone Encounter (Signed)
Contacted Miss [name]. Tells me she is not feeling nauseous and as such, has not taken the Zofran the ED provider prescribed. Tells me she has not had diarrhea. Does report some continued abdominal discomfort which is overall improving. Tells me is "not having any symptoms at all". Denies shortness of breath, cough, wheeze, fever. Reviewed isolation precautions. She has not yet contacted her PCP and was encouraged to do so. No indication for treatment with IV antiviral, PO antiviral, or MAB at this time.   Alver Sorrow, NP

## 2020-08-17 NOTE — Telephone Encounter (Signed)
Called to discuss with patient about COVID-19 symptoms and the use of one of the available treatments for those with mild to moderate Covid symptoms and at a high risk of hospitalization.  Pt appears to qualify for outpatient treatment due to co-morbid conditions and/or a member of an at-risk group in accordance with the FDA Emergency Use Authorization.    Symptom onset: 08/12/20 Vaccinated: Yes Booster? No Immunocompromised? No Qualifiers: Age Only symptoms are abdominal pain and vomiting.     [name]

## 2020-09-26 ENCOUNTER — Encounter (HOSPITAL_COMMUNITY): Payer: Self-pay

## 2020-09-26 ENCOUNTER — Other Ambulatory Visit: Payer: Self-pay

## 2020-09-26 ENCOUNTER — Inpatient Hospital Stay (HOSPITAL_COMMUNITY)
Admission: EM | Admit: 2020-09-26 | Discharge: 2020-09-28 | DRG: 682 | Disposition: A | Discharge status: Home / Self Care | Payer: Medicare Other | Attending: Internal Medicine | Admitting: Internal Medicine

## 2020-09-26 ENCOUNTER — Emergency Department (HOSPITAL_COMMUNITY): Payer: Medicare Other

## 2020-09-26 DIAGNOSIS — Z8249 Family history of ischemic heart disease and other diseases of the circulatory system: Secondary | ICD-10-CM | POA: Diagnosis not present

## 2020-09-26 DIAGNOSIS — F32A Depression, unspecified: Secondary | ICD-10-CM | POA: Diagnosis present

## 2020-09-26 DIAGNOSIS — J189 Pneumonia, unspecified organism: Secondary | ICD-10-CM | POA: Diagnosis present

## 2020-09-26 DIAGNOSIS — F419 Anxiety disorder, unspecified: Secondary | ICD-10-CM | POA: Diagnosis present

## 2020-09-26 DIAGNOSIS — Z79899 Other long term (current) drug therapy: Secondary | ICD-10-CM | POA: Diagnosis not present

## 2020-09-26 DIAGNOSIS — Z882 Allergy status to sulfonamides status: Secondary | ICD-10-CM | POA: Diagnosis not present

## 2020-09-26 DIAGNOSIS — K529 Noninfective gastroenteritis and colitis, unspecified: Secondary | ICD-10-CM | POA: Diagnosis present

## 2020-09-26 DIAGNOSIS — T39395A Adverse effect of other nonsteroidal anti-inflammatory drugs [NSAID], initial encounter: Secondary | ICD-10-CM | POA: Diagnosis present

## 2020-09-26 DIAGNOSIS — R54 Age-related physical debility: Secondary | ICD-10-CM | POA: Diagnosis present

## 2020-09-26 DIAGNOSIS — N179 Acute kidney failure, unspecified: Secondary | ICD-10-CM | POA: Diagnosis not present

## 2020-09-26 DIAGNOSIS — Z20822 Contact with and (suspected) exposure to covid-19: Secondary | ICD-10-CM | POA: Diagnosis present

## 2020-09-26 DIAGNOSIS — R0902 Hypoxemia: Secondary | ICD-10-CM | POA: Diagnosis not present

## 2020-09-26 DIAGNOSIS — G8929 Other chronic pain: Secondary | ICD-10-CM | POA: Diagnosis present

## 2020-09-26 DIAGNOSIS — Z7983 Long term (current) use of bisphosphonates: Secondary | ICD-10-CM | POA: Diagnosis not present

## 2020-09-26 DIAGNOSIS — E860 Dehydration: Secondary | ICD-10-CM | POA: Diagnosis present

## 2020-09-26 DIAGNOSIS — I959 Hypotension, unspecified: Secondary | ICD-10-CM | POA: Diagnosis present

## 2020-09-26 DIAGNOSIS — K219 Gastro-esophageal reflux disease without esophagitis: Secondary | ICD-10-CM | POA: Diagnosis present

## 2020-09-26 LAB — CBC
HCT: 34.2 % — ABNORMAL LOW (ref 36.0–46.0)
Hemoglobin: 11.1 g/dL — ABNORMAL LOW (ref 12.0–15.0)
MCH: 29.2 pg (ref 26.0–34.0)
MCHC: 32.5 g/dL (ref 30.0–36.0)
MCV: 90 fL (ref 80.0–100.0)
Platelets: 191 10*3/uL (ref 150–400)
RBC: 3.8 MIL/uL — ABNORMAL LOW (ref 3.87–5.11)
RDW: 15 % (ref 11.5–15.5)
WBC: 15.3 10*3/uL — ABNORMAL HIGH (ref 4.0–10.5)
nRBC: 0 % (ref 0.0–0.2)

## 2020-09-26 LAB — URINALYSIS, ROUTINE W REFLEX MICROSCOPIC
Bilirubin Urine: NEGATIVE
Glucose, UA: NEGATIVE mg/dL
Hgb urine dipstick: NEGATIVE
Ketones, ur: NEGATIVE mg/dL
Leukocytes,Ua: NEGATIVE
Nitrite: NEGATIVE
Protein, ur: NEGATIVE mg/dL
Specific Gravity, Urine: 1.011 (ref 1.005–1.030)
pH: 8 (ref 5.0–8.0)

## 2020-09-26 LAB — COMPREHENSIVE METABOLIC PANEL
ALT: 11 U/L (ref 0–44)
AST: 35 U/L (ref 15–41)
Albumin: 3.7 g/dL (ref 3.5–5.0)
Alkaline Phosphatase: 80 U/L (ref 38–126)
Anion gap: 12 (ref 5–15)
BUN: 34 mg/dL — ABNORMAL HIGH (ref 8–23)
CO2: 27 mmol/L (ref 22–32)
Calcium: 9.1 mg/dL (ref 8.9–10.3)
Chloride: 89 mmol/L — ABNORMAL LOW (ref 98–111)
Creatinine, Ser: 2.03 mg/dL — ABNORMAL HIGH (ref 0.44–1.00)
GFR, Estimated: 25 mL/min — ABNORMAL LOW (ref >60)
Glucose, Bld: 100 mg/dL — ABNORMAL HIGH (ref 70–99)
Potassium: 3.9 mmol/L (ref 3.5–5.1)
Sodium: 128 mmol/L — ABNORMAL LOW (ref 135–145)
Total Bilirubin: 0.9 mg/dL (ref 0.3–1.2)
Total Protein: 7.1 g/dL (ref 6.5–8.1)

## 2020-09-26 LAB — LIPASE, BLOOD: Lipase: 21 U/L (ref 11–51)

## 2020-09-26 LAB — LACTIC ACID, PLASMA
Lactic Acid, Venous: 2 mmol/L (ref 0.5–1.9)
Lactic Acid, Venous: 1.7 mmol/L (ref 0.5–1.9)

## 2020-09-26 LAB — RESP PANEL BY RT-PCR (FLU A&B, COVID) ARPGX2
Influenza A by PCR: NEGATIVE
Influenza B by PCR: NEGATIVE
SARS Coronavirus 2 by RT PCR: NEGATIVE

## 2020-09-26 LAB — PROCALCITONIN: Procalcitonin: 34.96 ng/mL

## 2020-09-26 LAB — BRAIN NATRIURETIC PEPTIDE: B Natriuretic Peptide: 155.5 pg/mL — ABNORMAL HIGH (ref 0.0–100.0)

## 2020-09-26 MED ORDER — ALPRAZOLAM 1 MG PO TABS
1.0000 mg | ORAL_TABLET | Freq: Three times a day (TID) | ORAL | Status: DC | PRN
  Administered 2020-09-26 – 2020-09-27 (×2): 1 mg via ORAL
  Filled 2020-09-26 (×2): qty 1

## 2020-09-26 MED ORDER — SODIUM CHLORIDE 0.9 % IV SOLN
500.0000 mg | INTRAVENOUS | Status: DC
  Filled 2020-09-26: qty 500

## 2020-09-26 MED ORDER — ALBUTEROL SULFATE (2.5 MG/3ML) 0.083% IN NEBU: 2.5000 mg | INHALATION_SOLUTION | Freq: Four times a day (QID) | RESPIRATORY_TRACT | Status: DC | PRN

## 2020-09-26 MED ORDER — ONDANSETRON HCL 4 MG PO TABS: 4.0000 mg | ORAL_TABLET | Freq: Four times a day (QID) | ORAL | Status: DC | PRN

## 2020-09-26 MED ORDER — ACETAMINOPHEN 650 MG RE SUPP: 650.0000 mg | Freq: Four times a day (QID) | RECTAL | Status: DC | PRN

## 2020-09-26 MED ORDER — ACETAMINOPHEN 325 MG PO TABS
650.0000 mg | ORAL_TABLET | Freq: Four times a day (QID) | ORAL | Status: DC | PRN
  Administered 2020-09-28: 650 mg via ORAL
  Filled 2020-09-26: qty 2

## 2020-09-26 MED ORDER — SODIUM CHLORIDE 0.9 % IV BOLUS
1000.0000 mL | Freq: Once | INTRAVENOUS | Status: AC
  Administered 2020-09-26: 1000 mL via INTRAVENOUS

## 2020-09-26 MED ORDER — HEPARIN SODIUM (PORCINE) 5000 UNIT/ML IJ SOLN
5000.0000 [IU] | Freq: Three times a day (TID) | INTRAMUSCULAR | Status: DC
  Administered 2020-09-26 – 2020-09-28 (×5): 5000 [IU] via SUBCUTANEOUS
  Filled 2020-09-26 (×5): qty 1

## 2020-09-26 MED ORDER — TRAZODONE HCL 100 MG PO TABS
200.0000 mg | ORAL_TABLET | Freq: Every day | ORAL | Status: DC
  Administered 2020-09-26: 200 mg via ORAL
  Filled 2020-09-26: qty 2

## 2020-09-26 MED ORDER — SODIUM CHLORIDE 0.9 % IV SOLN: INTRAVENOUS | Status: DC

## 2020-09-26 MED ORDER — PANTOPRAZOLE SODIUM 40 MG PO TBEC
40.0000 mg | DELAYED_RELEASE_TABLET | Freq: Every day | ORAL | Status: DC
  Administered 2020-09-27 – 2020-09-28 (×2): 40 mg via ORAL
  Filled 2020-09-26 (×2): qty 1

## 2020-09-26 MED ORDER — ALBUTEROL SULFATE (2.5 MG/3ML) 0.083% IN NEBU
2.5000 mg | INHALATION_SOLUTION | Freq: Four times a day (QID) | RESPIRATORY_TRACT | Status: DC
  Administered 2020-09-26: 2.5 mg via RESPIRATORY_TRACT
  Filled 2020-09-26: qty 3

## 2020-09-26 MED ORDER — ZOLPIDEM TARTRATE 5 MG PO TABS
2.5000 mg | ORAL_TABLET | Freq: Every evening | ORAL | Status: DC | PRN
  Administered 2020-09-26 – 2020-09-27 (×2): 2.5 mg via ORAL
  Filled 2020-09-26 (×2): qty 1

## 2020-09-26 MED ORDER — QUETIAPINE FUMARATE 300 MG PO TABS
600.0000 mg | ORAL_TABLET | Freq: Every day | ORAL | Status: DC
  Administered 2020-09-26: 600 mg via ORAL
  Filled 2020-09-26: qty 2

## 2020-09-26 MED ORDER — HYDROCODONE-ACETAMINOPHEN 10-325 MG PO TABS: 1.0000 | ORAL_TABLET | Freq: Four times a day (QID) | ORAL | Status: DC | PRN

## 2020-09-26 MED ORDER — GUAIFENESIN-DM 100-10 MG/5ML PO SYRP
5.0000 mL | ORAL_SOLUTION | ORAL | Status: DC | PRN
  Administered 2020-09-26 – 2020-09-27 (×2): 5 mL via ORAL
  Filled 2020-09-26 (×2): qty 10

## 2020-09-26 MED ORDER — SODIUM CHLORIDE 0.9 % IV SOLN
1.0000 g | Freq: Once | INTRAVENOUS | Status: DC
  Filled 2020-09-26: qty 10

## 2020-09-26 MED ORDER — SODIUM CHLORIDE 0.9 % IV SOLN
1.0000 g | INTRAVENOUS | Status: DC
  Administered 2020-09-27: 1 g via INTRAVENOUS
  Filled 2020-09-26: qty 0.05
  Filled 2020-09-26: qty 10

## 2020-09-26 MED ORDER — ESCITALOPRAM OXALATE 20 MG PO TABS
20.0000 mg | ORAL_TABLET | Freq: Every day | ORAL | Status: DC
  Administered 2020-09-26 – 2020-09-28 (×3): 20 mg via ORAL
  Filled 2020-09-26 (×3): qty 1

## 2020-09-26 MED ORDER — ONDANSETRON HCL 4 MG/2ML IJ SOLN: 4.0000 mg | Freq: Four times a day (QID) | INTRAMUSCULAR | Status: DC | PRN

## 2020-09-26 MED ORDER — SODIUM CHLORIDE 0.9 % IV SOLN
500.0000 mg | Freq: Once | INTRAVENOUS | Status: AC
  Administered 2020-09-26: 500 mg via INTRAVENOUS
  Filled 2020-09-26: qty 500

## 2020-09-26 MED ORDER — HYDRALAZINE HCL 20 MG/ML IJ SOLN: 10.0000 mg | Freq: Four times a day (QID) | INTRAMUSCULAR | Status: DC | PRN

## 2020-09-26 MED ORDER — SODIUM CHLORIDE 0.9 % IV SOLN
1.0000 g | Freq: Once | INTRAVENOUS | Status: AC
  Administered 2020-09-26: 1 g via INTRAVENOUS
  Filled 2020-09-26: qty 10

## 2020-09-26 MED ORDER — SODIUM CHLORIDE 0.9 % IV SOLN
100.0000 mg | Freq: Two times a day (BID) | INTRAVENOUS | Status: DC
  Administered 2020-09-27 – 2020-09-28 (×2): 100 mg via INTRAVENOUS
  Filled 2020-09-26 (×2): qty 100

## 2020-09-26 NOTE — H&P (Signed)
[facility] History and Physical  [name] WSF:[id number] DOB: Feb 16, 1946 DOA: 09/26/2020 PCP: Laqueta Due., MD  Admitted from: Home Chief Complaint: Nausea, vomiting, diarrhea, shortness of breath  History of Present Illness: [name] is a 75 y.o. female with past medical history significant for migraine, chronic pain, GERD, pneumonia. Patient presented to the ED today from home with complaint of difficulty walking despite help from her husband.  In the ED, she had a low blood pressure down to 89/59 Labs showed sodium low at 128, creatinine significantly elevated to 2.03, lactic acid elevated to 2, hemoglobin low at 11.1 Urinalysis with hazy yellow urine, negative for nitrates Chest x-ray showed bilateral lower lobe infiltrates. Hospitalist service was consulted for inpatient admission and management.  Review of Systems:  All systems were reviewed and were negative unless otherwise mentioned in the HPI  Past medical history: Past Medical History:  Diagnosis Date  . Anxiety   . Chronic pain   . Constipation   . GERD (gastroesophageal reflux disease)   . History of blood transfusion   . Migraine    last one since 05/2015  . Pneumonia 2014ish    Past surgical history: Past Surgical History:  Procedure Laterality Date  . ABDOMINAL WALL MESH  REMOVAL    . APPENDECTOMY    . BLADDER SURGERY     Mesh implanted  . CATARACT EXTRACTION W/ INTRAOCULAR LENS  IMPLANT, BILATERAL Bilateral   . COLONOSCOPY    . KYPHOPLASTY N/A 09/01/2014   Procedure: KYPHOPLASTY;  Surgeon: Emilee Hero, MD;  Location: [facility] OR;  Service: Orthopedics;  Laterality: N/A;  Lumbar 1 kyphoplasty  . LAMINECTOMY WITH POSTERIOR LATERAL ARTHRODESIS LEVEL 1 N/A 09/27/2015   Procedure: L3-4, 4-5 Laminectomy with posterior lateral arthrodesis;  Surgeon: Hilda Lias, MD;  Location: [facility] NEURO ORS;  Service: Neurosurgery;  Laterality: N/A;  L 4-5 Laminectomy with posterior lateral arthrodesis,  partial L3 laminectomy  . OOPHORECTOMY Right   . SHOULDER SURGERY Left    X 2  . TONSILLECTOMY      Social History:  reports that she has never smoked. She has never used smokeless tobacco. She reports that she does not drink alcohol and does not use drugs.  Allergies:  Allergies  Allergen Reactions  . Sulfa Antibiotics Hives    Family history:  Family History  Problem Relation Age of Onset  . Coronary artery disease Father 41  . Coronary artery disease Mother 70     Home Meds: Prior to Admission medications   Medication Sig Start Date End Date Taking? Authorizing Provider  alendronate (FOSAMAX) 70 MG tablet Take 70 mg by mouth once a week. Sunday 07/14/20  Yes [provider]  ALPRAZolam Prudy Feeler) 1 MG tablet Take 1 mg by mouth 3 (three) times daily as needed for anxiety or sleep.   Yes [provider]  ergocalciferol (VITAMIN D2) 1.25 MG (50000 UT) capsule Take 1 capsule by mouth once a week. 11/26/16  Yes [provider]  escitalopram (LEXAPRO) 20 MG tablet Take 20 mg by mouth daily. 06/14/20  Yes [provider]  HYDROcodone-acetaminophen (NORCO) 10-325 MG tablet Take 1 tablet by mouth 3 (three) times daily as needed for pain. 08/12/20  Yes [provider]  meloxicam (MOBIC) 7.5 MG tablet Take 7.5 mg by mouth daily as needed for pain. 09/15/20  Yes [provider]  omeprazole (PRILOSEC) 20 MG capsule Take 20 mg by mouth 2 (two) times daily before a meal.   Yes  [provider]  ondansetron (ZOFRAN ODT) 4 MG disintegrating tablet Take 1 tablet (4 mg total) by mouth every 8 (eight) hours as needed for nausea or vomiting. 08/15/20  Yes Audley Hose, Eustace Moore, MD  QUEtiapine (SEROQUEL) 300 MG tablet Take 600 mg by mouth at bedtime. 09/19/15  Yes [provider]  sennosides-docusate sodium (SENOKOT-S) 8.6-50 MG tablet Take 2 tablets by mouth at bedtime.   Yes [provider]  traZODone (DESYREL) 100 MG tablet Take 200  mg by mouth at bedtime. 01/31/13  Yes [provider]  zolpidem (AMBIEN) 5 MG tablet Take 2.5 mg by mouth at bedtime as needed for sleep. 06/17/20  Yes [provider]  cephALEXin (KEFLEX) 500 MG capsule Take 1 capsule (500 mg total) by mouth 2 (two) times daily. Patient not taking: No sig reported 07/30/20   Terrilee Files, MD  polyethylene glycol [facility] / Ethelene Hal) packet Take 17 g by mouth 2 (two) times daily as needed. Patient not taking: Reported on 09/26/2020 04/13/14   Zannie Cove, MD    Physical Exam: Vitals:   09/26/20 1400 09/26/20 1430 09/26/20 1500 09/26/20 1530  BP: 107/66 128/77 104/85 109/60  Pulse: 81 89 80 86  Resp:   17 18  Temp:      TempSrc:      SpO2: 99% 95% 98% 98%  Weight:      Height:       Wt Readings from Last 3 Encounters:  09/26/20 52.6 kg  07/30/20 50.8 kg  09/30/15 52.9 kg   Body mass index is 19.91 kg/m.  General exam: Pleasant elderly Caucasian female.  Not in distress. Skin: No rashes, lesions or ulcers. HEENT: Atraumatic, normocephalic, no obvious bleeding Lungs: Diminished air entry in both bases, otherwise clear to auscultation bilaterally CVS: Regular rate and rhythm, no murmur GI/Abd soft, nontender, nondistended, bowel sound present  CNS: Alert, awake, oriented x3 Psychiatry: Mood appropriate Extremities: No pedal edema, no calf tenderness     Consult Orders  (From admission, onward)         Start     Ordered   09/26/20 1649  Consult to hospitalist  Once       Provider:  (Not yet assigned)  Question Answer Comment  Place call to: [facility]   Reason for Consult Admit      09/26/20 1648          Labs on Admission:   CBC: Recent Labs  Lab 09/26/20 1110  WBC 15.3*  HGB 11.1*  HCT 34.2*  MCV 90.0  PLT 191    Basic Metabolic Panel: Recent Labs  Lab 09/26/20 1110  NA 128*  K 3.9  CL 89*  CO2 27  GLUCOSE 100*  BUN 34*  CREATININE 2.03*  CALCIUM 9.1    Liver Function  Tests: Recent Labs  Lab 09/26/20 1110  AST 35  ALT 11  ALKPHOS 80  BILITOT 0.9  PROT 7.1  ALBUMIN 3.7   Recent Labs  Lab 09/26/20 1110  LIPASE 21   No results for input(s): AMMONIA in the last 168 hours.  Cardiac Enzymes: No results for input(s): CKTOTAL, CKMB, CKMBINDEX, TROPONINI in the last 168 hours.  BNP (last 3 results) No results for input(s): BNP in the last 8760 hours.  ProBNP (last 3 results) No results for input(s): PROBNP in the last 8760 hours.  CBG: No results for input(s): GLUCAP in the last 168 hours.  Lipase     Component Value Date/Time   LIPASE  21 09/26/2020 1110     Urinalysis    Component Value Date/Time   COLORURINE YELLOW 09/26/2020 1308   APPEARANCEUR HAZY (A) 09/26/2020 1308   LABSPEC 1.011 09/26/2020 1308   PHURINE 8.0 09/26/2020 1308   GLUCOSEU NEGATIVE 09/26/2020 1308   HGBUR NEGATIVE 09/26/2020 1308   BILIRUBINUR NEGATIVE 09/26/2020 1308   KETONESUR NEGATIVE 09/26/2020 1308   PROTEINUR NEGATIVE 09/26/2020 1308   UROBILINOGEN 0.2 08/31/2014 1454   NITRITE NEGATIVE 09/26/2020 1308   LEUKOCYTESUR NEGATIVE 09/26/2020 1308     Drugs of Abuse     Component Value Date/Time   LABOPIA POSITIVE (A) 09/30/2015 1031   COCAINSCRNUR NONE DETECTED 09/30/2015 1031   LABBENZ POSITIVE (A) 09/30/2015 1031   AMPHETMU NONE DETECTED 09/30/2015 1031   THCU NONE DETECTED 09/30/2015 1031   LABBARB NONE DETECTED 09/30/2015 1031      Radiological Exams on Admission: DG Chest Port 1 View  Result Date: 09/26/2020 CLINICAL DATA:  Abdominal pain, nausea and vomiting. EXAM: PORTABLE CHEST 1 VIEW COMPARISON:  07/30/2020 FINDINGS: The cardiac silhouette, mediastinal and hilar contours are within normal limits given the AP projection, portable technique and low lung volumes. Bilateral lower lobe airspace process, most likely pneumonia. No effusions or pneumothorax. The bony thorax is intact. Mildly distended bowel noted in the upper abdomen. IMPRESSION:  Bilateral lower lobe infiltrates. Electronically Signed   By: Rudie Meyer M.D.   On: 09/26/2020 12:34     ------------------------------------------------------------------------------------------------------ Assessment/Plan: Active Problems:   AKI (acute kidney injury) (HCC)  Acute gastroenteritis -Complains of few days of nausea, vomiting and diarrhea which stopped yesterday. -Looks dehydrated clinically.  Start on IV fluid.  IV Zofran as needed  Bilateral lower lobe pneumonia -Probably aspirated with vomiting. -WBC count elevated, lactic acid elevated probably because of dehydration. -Started on IV ceftriaxone and IV azithromycin in the ED.  Continue the same. Recent Labs  Lab 09/26/20 1110 09/26/20 1300 09/26/20 1540  WBC 15.3*  --   --   LATICACIDVEN  --  2.0* 1.7   AKI -Probably due to gastroenteritis and Mobic -Creatinine normal, elevated to 2.02 today.  Expect improvement with IV fluid. Recent Labs    07/30/20 1735 08/15/20 1311 09/26/20 1110  BUN 17 11 34*  CREATININE 2.07* 0.83 2.03*   Chronic pain -Home meds include Norco 10/325 3 times daily as needed, Mobic 7.5 mg daily as needed, -Continue Norco.  Hold Mobic because of AKI  Anxiety/depression -Home meds include Seroquel 600 mg at bedtime, trazodone 200 mg bedtime, Ambien 2.5 mg bedtime as needed, Xanax 1 mg 3 times daily as needed -Resume all  GERD -PPI  Mobility: Encourage ambulation Code Status:   Code Status: Prior full code DVT prophylaxis:  Heparin subcu Antimicrobials:  IV Rocephin, IV azithromycin Fluid: Normal saline at 100 mill per hour  Diet:  Diet Order            Diet clear liquid Room service appropriate? Yes; Fluid consistency: Thin  Diet effective now               Patient wants to try only clear liquid diet for today.  Consultants: None Family Communication:  Not at bedside  Dispo: The patient is from: Home              Anticipated d/c is to: Home               Anticipated d/c date is: 2 days  ------------------------------------------------------------------------------------- Severity of Illness: The appropriate patient status for this  patient is INPATIENT. Inpatient status is judged to be reasonable and necessary in order to provide the required intensity of service to ensure the patient's safety. The patient's presenting symptoms, physical exam findings, and initial radiographic and laboratory data in the context of their chronic comorbidities is felt to place them at high risk for further clinical deterioration. Furthermore, it is not anticipated that the patient will be medically stable for discharge from the hospital within 2 midnights of admission. The following factors support the patient status of inpatient.   " The patient's presenting symptoms include nausea vomiting diarrhea, difficulty breathing. " The worrisome physical exam findings include diminished air entry in both bases. " The initial radiographic and laboratory data are worrisome because of bilateral infiltrates, AKI. " The chronic co-morbidities include anxiety depression.   * I certify that at the point of admission it is my clinical judgment that the patient will require inpatient hospital care spanning beyond 2 midnights from the point of admission due to high intensity of service, high risk for further deterioration and high frequency of surveillance required.*  Signed, Lorin Glass, MD [facility] 09/26/2020

## 2020-09-26 NOTE — ED Provider Notes (Signed)
[facility]-EMERGENCY DEPT Provider Note   CSN: [id number] Arrival date & time: 09/26/20  1036     History Chief Complaint  Patient presents with  . Emesis  . Abdominal Pain    [name] is a 75 y.o. female.  HPI She presents for evaluation of weakness making difficult to walk, despite having her husband help her at home.  She has had some dysuria recently but no urinary frequency.  She denies chest pain, shortness of breath, nausea or vomiting.  She is taking her usual medications.  The weakness is worse with attempts to ambulate.  No other recent illnesses.  She has had COVID vaccines.  There are no other known modifying factors.    Past Medical History:  Diagnosis Date  . Anxiety   . Chronic pain   . Constipation   . GERD (gastroesophageal reflux disease)   . History of blood transfusion   . Migraine    last one since 05/2015  . Pneumonia 2014ish    Patient Active Problem List   Diagnosis Date Noted  . Altered mental status 09/30/2015  . Fever 09/30/2015  . Insomnia 09/30/2015  . Fever and chills 09/30/2015  . Lumbar stenosis 09/27/2015  . Gastro-esophageal reflux disease without esophagitis 07/15/2015  . Adaptive colitis 07/15/2015  . Incomplete bladder emptying 07/15/2015  . Excessive urination at night 07/15/2015  . Nasal septal perforation 07/15/2015  . Polyneuropathy, postherpetic 07/15/2015  . Compression fracture 09/01/2014  . Pars defect 04/24/2014  . Pneumonia 04/11/2014  . Constipation due to opioid therapy 01/29/2014  . Benzodiazepine overdose, unintentional 05/26/2013  . Polypharmacy 05/24/2013  . Hypokalemia 05/24/2013  . Unstable gait 05/24/2013  . Anxiety 05/24/2013  . Chronic pain 05/24/2013  . Clinical depression 08/08/2012  . Ilioinguinal neuralgia 04/15/2012  . Abdominal pain 02/06/2012  . Continuous opioid dependence (HCC) 02/06/2012    Past Surgical History:  Procedure Laterality Date  . ABDOMINAL WALL  MESH  REMOVAL    . APPENDECTOMY    . BLADDER SURGERY     Mesh implanted  . CATARACT EXTRACTION W/ INTRAOCULAR LENS  IMPLANT, BILATERAL Bilateral   . COLONOSCOPY    . KYPHOPLASTY N/A 09/01/2014   Procedure: KYPHOPLASTY;  Surgeon: Emilee Hero, MD;  Location: [facility] OR;  Service: Orthopedics;  Laterality: N/A;  Lumbar 1 kyphoplasty  . LAMINECTOMY WITH POSTERIOR LATERAL ARTHRODESIS LEVEL 1 N/A 09/27/2015   Procedure: L3-4, 4-5 Laminectomy with posterior lateral arthrodesis;  Surgeon: Hilda Lias, MD;  Location: [facility] NEURO ORS;  Service: Neurosurgery;  Laterality: N/A;  L 4-5 Laminectomy with posterior lateral arthrodesis, partial L3 laminectomy  . OOPHORECTOMY Right   . SHOULDER SURGERY Left    X 2  . TONSILLECTOMY       OB History   No obstetric history on file.     Family History  Problem Relation Age of Onset  . Coronary artery disease Father 69  . Coronary artery disease Mother 55    Social History   Tobacco Use  . Smoking status: Never Smoker  . Smokeless tobacco: Never Used  Vaping Use  . Vaping Use: Never used  Substance Use Topics  . Alcohol use: No  . Drug use: No    Home Medications Prior to Admission medications   Medication Sig Start Date End Date Taking? Authorizing Provider  alendronate (FOSAMAX) 70 MG tablet Take 70 mg by mouth once a week. Sunday 07/14/20   [provider]  ALPRAZolam Prudy Feeler) 1 MG tablet Take  1 mg by mouth 3 (three) times daily as needed for anxiety or sleep.    [provider]  cephALEXin (KEFLEX) 500 MG capsule Take 1 capsule (500 mg total) by mouth 2 (two) times daily. Patient not taking: No sig reported 07/30/20   Terrilee Files, MD  dicyclomine (BENTYL) 10 MG capsule Take 10 mg by mouth 3 (three) times daily. 07/19/20   [provider]  escitalopram (LEXAPRO) 20 MG tablet Take 20 mg by mouth daily. 06/14/20   [provider]  HYDROcodone-acetaminophen (NORCO) 10-325 MG tablet Take 1 tablet by  mouth 3 (three) times daily as needed for pain. 08/12/20   [provider]  omeprazole (PRILOSEC) 20 MG capsule Take 20 mg by mouth daily.     [provider]  ondansetron (ZOFRAN ODT) 4 MG disintegrating tablet Take 1 tablet (4 mg total) by mouth every 8 (eight) hours as needed for nausea or vomiting. 08/15/20   Cheryll Cockayne, MD  polyethylene glycol (MIRALAX / GLYCOLAX) packet Take 17 g by mouth 2 (two) times daily as needed. Patient taking differently: Take 17 g by mouth daily. 04/13/14   Zannie Cove, MD  QUEtiapine (SEROQUEL) 300 MG tablet Take 600 mg by mouth at bedtime. 09/19/15   [provider]  sennosides-docusate sodium (SENOKOT-S) 8.6-50 MG tablet Take 2 tablets by mouth at bedtime.    [provider]  traZODone (DESYREL) 100 MG tablet Take 200 mg by mouth at bedtime. 01/31/13   [provider]  zolpidem (AMBIEN) 5 MG tablet Take 5 mg by mouth at bedtime as needed for sleep. 06/17/20   [provider]    Allergies    Sulfa antibiotics  Review of Systems   Review of Systems  All other systems reviewed and are negative.   Physical Exam Updated Vital Signs BP 109/60   Pulse 86   Temp 98.3 F (36.8 C) (Oral)   Resp 18   Ht 5\' 4"  (1.626 m)   Wt 52.6 kg   SpO2 98%   BMI 19.91 kg/m   Physical Exam Vitals and nursing note reviewed.  Constitutional:      General: She is not in acute distress.    Appearance: She is well-developed. She is not ill-appearing, toxic-appearing or diaphoretic.     Comments: Elderly, frail  HENT:     Head: Normocephalic and atraumatic.     Right Ear: External ear normal.     Left Ear: External ear normal.  Eyes:     Conjunctiva/sclera: Conjunctivae normal.     Pupils: Pupils are equal, round, and reactive to light.  Neck:     Trachea: Phonation normal.  Cardiovascular:     Rate and Rhythm: Normal rate and regular rhythm.     Heart sounds: Normal heart sounds.     Comments:  Hypotensive Pulmonary:     Effort: Pulmonary effort is normal.     Breath sounds: Normal breath sounds.  Abdominal:     Palpations: Abdomen is soft.     Tenderness: There is no abdominal tenderness.  Musculoskeletal:        General: No swelling or tenderness. Normal range of motion.     Cervical back: Normal range of motion and neck supple.     Right lower leg: No edema.     Left lower leg: No edema.  Skin:    General: Skin is warm and dry.  Neurological:     Mental Status: She is alert and oriented to  person, place, and time.     Cranial Nerves: No cranial nerve deficit.     Sensory: No sensory deficit.     Motor: No abnormal muscle tone.     Coordination: Coordination normal.  Psychiatric:        Mood and Affect: Mood normal.        Behavior: Behavior normal.        Thought Content: Thought content normal.        Judgment: Judgment normal.     ED Results / Procedures / Treatments   Labs (all labs ordered are listed, but only abnormal results are displayed) Labs Reviewed  COMPREHENSIVE METABOLIC PANEL - Abnormal; Notable for the following components:      Result Value   Sodium 128 (*)    Chloride 89 (*)    Glucose, Bld 100 (*)    BUN 34 (*)    Creatinine, Ser 2.03 (*)    GFR, Estimated 25 (*)    All other components within normal limits  CBC - Abnormal; Notable for the following components:   WBC 15.3 (*)    RBC 3.80 (*)    Hemoglobin 11.1 (*)    HCT 34.2 (*)    All other components within normal limits  URINALYSIS, ROUTINE W REFLEX MICROSCOPIC - Abnormal; Notable for the following components:   APPearance HAZY (*)    All other components within normal limits  LACTIC ACID, PLASMA - Abnormal; Notable for the following components:   Lactic Acid, Venous 2.0 (*)    All other components within normal limits  RESP PANEL BY RT-PCR (FLU A&B, COVID) ARPGX2  CULTURE, BLOOD (ROUTINE X 2)  CULTURE, BLOOD (ROUTINE X 2)  LIPASE, BLOOD  LACTIC ACID, PLASMA     EKG None  Radiology DG Chest Port 1 View  Result Date: 09/26/2020 CLINICAL DATA:  Abdominal pain, nausea and vomiting. EXAM: PORTABLE CHEST 1 VIEW COMPARISON:  07/30/2020 FINDINGS: The cardiac silhouette, mediastinal and hilar contours are within normal limits given the AP projection, portable technique and low lung volumes. Bilateral lower lobe airspace process, most likely pneumonia. No effusions or pneumothorax. The bony thorax is intact. Mildly distended bowel noted in the upper abdomen. IMPRESSION: Bilateral lower lobe infiltrates. Electronically Signed   By: Rudie Meyer M.D.   On: 09/26/2020 12:34    Procedures Procedures   Medications Ordered in ED Medications  cefTRIAXone (ROCEPHIN) 1 g in sodium chloride 0.9 % 100 mL IVPB (1 g Intravenous New Bag/Given 09/26/20 1631)  azithromycin (ZITHROMAX) 500 mg in sodium chloride 0.9 % 250 mL IVPB (500 mg Intravenous New Bag/Given 09/26/20 1632)  sodium chloride 0.9 % bolus 1,000 mL (0 mLs Intravenous Stopped 09/26/20 1543)    ED Course  I have reviewed the triage vital signs and the nursing notes.  Pertinent labs & imaging results that were available during my care of the patient were reviewed by me and considered in my medical decision making (see chart for details).    MDM Rules/Calculators/A&P                           Patient Vitals for the past 24 hrs:  BP Temp Temp src Pulse Resp SpO2 Height Weight  09/26/20 1530 109/60 -- -- 86 18 98 % -- --  09/26/20 1500 104/85 -- -- 80 17 98 % -- --  09/26/20 1430 128/77 -- -- 89 -- 95 % -- --  09/26/20 1400 107/66 -- --  81 -- 99 % -- --  09/26/20 1330 110/63 -- -- 76 -- 99 % -- --  09/26/20 1300 101/62 -- -- 79 16 99 % -- --  09/26/20 1230 98/61 -- -- 72 -- 98 % -- --  09/26/20 1200 (!) 89/59 -- -- 77 17 98 % -- --  09/26/20 1130 (!) 89/66 -- -- 77 -- 95 % -- --  09/26/20 1048 -- -- -- -- -- -- 5\' 4"  (1.626 m) --  09/26/20 1044 91/65 98.3 F (36.8 C) Oral 85 18 (!) 88 % -- --   09/26/20 1043 -- -- -- -- -- -- 5\' 4"  (1.626 m) 52.6 kg    4:49 PM Reevaluation with update and discussion. After initial assessment and treatment, an updated evaluation reveals patient feels better but still states she is weak and unable to walk.09/28/20   Medical Decision Making:  This patient is presenting for evaluation of weakness with hypotension, which does require a range of treatment options, and is a complaint that involves a moderate risk of morbidity and mortality. The differential diagnoses include UTI, pneumonia, Covid infection. I decided to review old records, and in summary Ehly female presenting with nonspecific weakness, requiring comprehensive evaluation.  I obtained additional historical information from husband at bedside.  Clinical Laboratory Tests Ordered, included CBC, Metabolic panel, Urinalysis and Lactate, blood culture. Review indicates sodium low, chloride low, BUN high, creatinine high, white count high, lipase normal, lactate mild elevation, improved after fluids. Radiologic Tests Ordered, included chest x-ray.  I independently Visualized: Radiograph images, which show bilateral lower lobe infiltrate  Cardiac Monitor Tracing which shows sinus rhythm    Critical Interventions-clinical evaluation, laboratory testing, radiography, IV fluids, observation reassessment  After These Interventions, the Patient was reevaluated and was found to require hospitalization for weakness, hypotension, AKI, volume depletion.  CRITICAL CARE-yes Performed by:  Nursing Notes Reviewed/ Care Coordinated Applicable Imaging Reviewed Interpretation of Laboratory Data incorporated into ED treatment  4:53 PM-Consult complete with hospitalist. Patient case explained and discussed.  He agrees to admit patient for further evaluation and treatment. Call ended at 4:59 PM    Final Clinical Impression(s) / ED Diagnoses Final diagnoses:  Community acquired  pneumonia, unspecified laterality  AKI (acute kidney injury) (HCC)  Hypoxia    Rx / DC Orders ED Discharge Orders    None       Mancel Bale, MD 09/26/20 1704

## 2020-09-26 NOTE — ED Notes (Signed)
Pt removed her IV

## 2020-09-26 NOTE — ED Triage Notes (Signed)
Patient c/o abdominal pain and N/V since yesterday.  Sats in triage 88%. Patient placed on O2 2L/min via [place] and sats increased to 93%.

## 2020-09-27 LAB — CBC
HCT: 27.4 % — ABNORMAL LOW (ref 36.0–46.0)
Hemoglobin: 9 g/dL — ABNORMAL LOW (ref 12.0–15.0)
MCH: 29.6 pg (ref 26.0–34.0)
MCHC: 32.8 g/dL (ref 30.0–36.0)
MCV: 90.1 fL (ref 80.0–100.0)
Platelets: 138 10*3/uL — ABNORMAL LOW (ref 150–400)
RBC: 3.04 MIL/uL — ABNORMAL LOW (ref 3.87–5.11)
RDW: 14.8 % (ref 11.5–15.5)
WBC: 9.4 10*3/uL (ref 4.0–10.5)
nRBC: 0 % (ref 0.0–0.2)

## 2020-09-27 LAB — BASIC METABOLIC PANEL
Anion gap: 8 (ref 5–15)
BUN: 28 mg/dL — ABNORMAL HIGH (ref 8–23)
CO2: 24 mmol/L (ref 22–32)
Calcium: 8.1 mg/dL — ABNORMAL LOW (ref 8.9–10.3)
Chloride: 101 mmol/L (ref 98–111)
Creatinine, Ser: 1.39 mg/dL — ABNORMAL HIGH (ref 0.44–1.00)
GFR, Estimated: 39 mL/min — ABNORMAL LOW (ref >60)
Glucose, Bld: 87 mg/dL (ref 70–99)
Potassium: 3.9 mmol/L (ref 3.5–5.1)
Sodium: 133 mmol/L — ABNORMAL LOW (ref 135–145)

## 2020-09-27 LAB — BRAIN NATRIURETIC PEPTIDE: B Natriuretic Peptide: 325.6 pg/mL — ABNORMAL HIGH (ref 0.0–100.0)

## 2020-09-27 LAB — PROCALCITONIN: Procalcitonin: 17.64 ng/mL

## 2020-09-27 MED ORDER — HYDROCODONE-ACETAMINOPHEN 5-325 MG PO TABS
1.0000 | ORAL_TABLET | Freq: Four times a day (QID) | ORAL | Status: DC | PRN
Start: 2020-09-27 — End: 2020-09-28

## 2020-09-27 MED ORDER — QUETIAPINE FUMARATE 50 MG PO TABS
100.0000 mg | ORAL_TABLET | Freq: Every day | ORAL | Status: DC
  Administered 2020-09-27: 100 mg via ORAL
  Filled 2020-09-27: qty 2

## 2020-09-27 MED ORDER — TRAZODONE HCL 100 MG PO TABS
100.0000 mg | ORAL_TABLET | Freq: Every day | ORAL | Status: DC
  Filled 2020-09-27: qty 1

## 2020-09-27 NOTE — Progress Notes (Signed)
PROGRESS NOTE  [name]  DOB: 05-22-1946  PCP: Laqueta Due., MD HXT:[id number]  DOA: 09/26/2020  LOS: 1 day   Chief Complaint  Patient presents with  . Emesis  . Abdominal Pain   Brief narrative: [name] is a 75 y.o. female with PMH significant for migraine, chronic pain, GERD, pneumonia. Patient presented to the ED on 3/14 from home with complaint of difficulty walking despite help from her husband.  In the ED, she had a low blood pressure down to 89/59 Labs showed sodium low at 128, creatinine significantly elevated to 2.03, lactic acid elevated to 2, hemoglobin low at 11.1 Urinalysis with hazy yellow urine, negative for nitrates Chest x-ray showed bilateral lower lobe infiltrates. Patient was admitted to hospitalist service  Subjective: Patient was seen and examined this morning.  Elderly Caucasian female.  Very sleepy.  Open eyes on sternal rub.  Answered few questions and fell right back to sleep.  Per RN, later this morning, she became more awake. Chart reviewed. Afebrile, heart rate in 80s, blood pressure in low normal range. Labs from this morning with improvement in creatinine to 1.39  Assessment/Plan: Acute gastroenteritis -Presented with complaint of few days of nausea, vomiting and diarrhea which stopped yesterday. -Ongoing IV hydration with normal saline at 100 mill per hour.  Bilateral lower lobe pneumonia -Probably aspirated while vomiting. -WBC count elevated, procalcitonin elevated. Lactic acid elevated probably because of dehydration. -Currently on IV Rocephin and IV azithromycin. Recent Labs  Lab 09/26/20 1110 09/26/20 1300 09/26/20 1540 09/27/20 0257  WBC 15.3*  --   --  9.4  LATICACIDVEN  --  2.0* 1.7  --   PROCALCITON 34.96  --   --  17.64   AKI -Probably due to gastroenteritis and Mobic -Creatinine normal, elevated to 2.03 on admission. Improving with IV fluid. Recent Labs    07/30/20 1735 08/15/20 1311 09/26/20 1110  09/27/20 0257  BUN 17 11 34* 28*  CREATININE 2.07* 0.83 2.03* 1.39*   Chronic pain -Home meds include Norco 10/325 three times daily as needed, Mobic 7.5 mg daily as needed, -Continue Norco.    Mobic is on hold  Anxiety/depression -Home meds include multiple mood altering medications including Seroquel 600 mg at bedtime, trazodone 200 mg bedtime, Ambien 2.5 mg bedtime as needed, Xanax 1 mg 3 times daily as needed -Patient seems heavily sedated this morning. -I would minimize the dose of them  GERD -PPI  Mobility: Encourage ambulation Code Status:   Code Status: Full Code  Nutritional status: Body mass index is 19.91 kg/m.     Diet Order            Diet regular Room service appropriate? Yes; Fluid consistency: Thin  Diet effective now                 DVT prophylaxis: heparin injection 5,000 Units Start: 09/26/20 2200   Antimicrobials:  IV Rocephin, IV azithromycin Fluid: Normal saline at 100 mill per hour Consultants: None Family Communication:  None at bedside  Status is: Inpatient  Remains inpatient appropriate because: Continues to need IV fluid because of AKI  Dispo: The patient is from: Home              Anticipated d/c is to: Home              Patient currently is not medically stable to d/c.   Difficult to place patient No       Infusions:  . sodium  chloride 100 mL/hr at 09/27/20 1030  . cefTRIAXone (ROCEPHIN)  IV    . doxycycline (VIBRAMYCIN) IV      Scheduled Meds: . escitalopram  20 mg Oral Daily  . heparin  5,000 Units Subcutaneous Q8H  . pantoprazole  40 mg Oral Daily  . QUEtiapine  100 mg Oral QHS  . traZODone  100 mg Oral QHS    Antimicrobials: Anti-infectives (From admission, onward)   Start     Dose/Rate Route Frequency Ordered Stop   09/27/20 1600  cefTRIAXone (ROCEPHIN) 1 g in sodium chloride 0.9 % 100 mL IVPB        1 g 200 mL/hr over 30 Minutes Intravenous Every 24 hours 09/26/20 2100 10/01/20 1559   09/27/20 1600   doxycycline (VIBRAMYCIN) 100 mg in sodium chloride 0.9 % 250 mL IVPB        100 mg 125 mL/hr over 120 Minutes Intravenous Every 12 hours 09/26/20 2135 10/01/20 1559   09/27/20 0800  cefTRIAXone (ROCEPHIN) 1 g in sodium chloride 0.9 % 100 mL IVPB  Status:  Discontinued        1 g 200 mL/hr over 30 Minutes Intravenous  Once 09/26/20 2005 09/26/20 2132   09/27/20 0800  azithromycin (ZITHROMAX) 500 mg in sodium chloride 0.9 % 250 mL IVPB  Status:  Discontinued        500 mg 250 mL/hr over 60 Minutes Intravenous Every 24 hours 09/26/20 2005 09/26/20 2135   09/26/20 1615  cefTRIAXone (ROCEPHIN) 1 g in sodium chloride 0.9 % 100 mL IVPB        1 g 200 mL/hr over 30 Minutes Intravenous  Once 09/26/20 1610 09/26/20 1722   09/26/20 1615  azithromycin (ZITHROMAX) 500 mg in sodium chloride 0.9 % 250 mL IVPB        500 mg 250 mL/hr over 60 Minutes Intravenous  Once 09/26/20 1610 09/26/20 1931      PRN meds: acetaminophen **OR** acetaminophen, albuterol, ALPRAZolam, guaiFENesin-dextromethorphan, hydrALAZINE, HYDROcodone-acetaminophen, ondansetron **OR** ondansetron (ZOFRAN) IV, zolpidem   Objective: Vitals:   09/27/20 0500 09/27/20 0934  BP: 119/86 117/87  Pulse: 79 80  Resp: 16 18  Temp: 99 F (37.2 C) (!) 97.3 F (36.3 C)  SpO2: 94% 95%    Intake/Output Summary (Last 24 hours) at 09/27/2020 1303 Last data filed at 09/27/2020 1000 Gross per 24 hour  Intake 2385.81 ml  Output 500 ml  Net 1885.81 ml   Filed Weights   09/26/20 1043  Weight: 52.6 kg   Weight change:  Body mass index is 19.91 kg/m.   Physical Exam: General exam: Pleasant, elderly Caucasian female.  Somnolent.  Open eyes on sternal rub. Skin: No rashes, lesions or ulcers. HEENT: Atraumatic, normocephalic, no obvious bleeding Lungs: Clear to auscultation bilaterally CVS: Regular rate and rhythm, normal GI/Abd soft, nontender, nondistended, bowel sound present CNS: Sleeping, opens eyes on sternal rub.  Falls right back  to sleep.  Later this morning patient was more awake Psychiatry: Mood appropriate Extremities: No pedal edema, no calf tenderness  Data Review: I have personally reviewed the laboratory data and studies available.  Recent Labs  Lab 09/26/20 1110 09/27/20 0257  WBC 15.3* 9.4  HGB 11.1* 9.0*  HCT 34.2* 27.4*  MCV 90.0 90.1  PLT 191 138*   Recent Labs  Lab 09/26/20 1110 09/27/20 0257  NA 128* 133*  K 3.9 3.9  CL 89* 101  CO2 27 24  GLUCOSE 100* 87  BUN 34* 28*  CREATININE 2.03* 1.39*  CALCIUM 9.1 8.1*    F/u labs ordered Unresulted Labs (From admission, onward)          Start     Ordered   09/27/20 0500  Basic metabolic panel  Daily,   R      09/26/20 2005   09/27/20 0500  CBC  Daily,   R      09/26/20 2005   09/27/20 0500  Procalcitonin  Daily,   R      09/26/20 1704          Signed, Lorin Glass, MD [facility] 09/27/2020

## 2020-09-28 LAB — CBC
HCT: 28.7 % — ABNORMAL LOW (ref 36.0–46.0)
Hemoglobin: 9.1 g/dL — ABNORMAL LOW (ref 12.0–15.0)
MCH: 29 pg (ref 26.0–34.0)
MCHC: 31.7 g/dL (ref 30.0–36.0)
MCV: 91.4 fL (ref 80.0–100.0)
Platelets: 143 10*3/uL — ABNORMAL LOW (ref 150–400)
RBC: 3.14 MIL/uL — ABNORMAL LOW (ref 3.87–5.11)
RDW: 15 % (ref 11.5–15.5)
WBC: 8.5 10*3/uL (ref 4.0–10.5)
nRBC: 0 % (ref 0.0–0.2)

## 2020-09-28 LAB — BASIC METABOLIC PANEL
Anion gap: 11 (ref 5–15)
BUN: 11 mg/dL (ref 8–23)
CO2: 18 mmol/L — ABNORMAL LOW (ref 22–32)
Calcium: 8.6 mg/dL — ABNORMAL LOW (ref 8.9–10.3)
Chloride: 111 mmol/L (ref 98–111)
Creatinine, Ser: 0.93 mg/dL (ref 0.44–1.00)
GFR, Estimated: >60 mL/min (ref >60)
Glucose, Bld: 88 mg/dL (ref 70–99)
Potassium: 3.4 mmol/L — ABNORMAL LOW (ref 3.5–5.1)
Sodium: 140 mmol/L (ref 135–145)

## 2020-09-28 LAB — PROCALCITONIN: Procalcitonin: 6.9 ng/mL

## 2020-09-28 MED ORDER — GUAIFENESIN-DM 100-10 MG/5ML PO SYRP: 5.0000 mL | ORAL_SOLUTION | ORAL | 0 refills | Status: DC | PRN

## 2020-09-28 MED ORDER — SACCHAROMYCES BOULARDII 250 MG PO CAPS
250.0000 mg | ORAL_CAPSULE | Freq: Two times a day (BID) | ORAL | 0 refills | Status: AC
Start: 2020-09-28 — End: 2020-10-03

## 2020-09-28 MED ORDER — CEFDINIR 300 MG PO CAPS: 300.0000 mg | ORAL_CAPSULE | Freq: Two times a day (BID) | ORAL | 0 refills | Status: AC

## 2020-09-28 MED ORDER — DOXYCYCLINE HYCLATE 100 MG PO TABS
100.0000 mg | ORAL_TABLET | Freq: Two times a day (BID) | ORAL | Status: DC
  Filled 2020-09-28: qty 1

## 2020-09-28 NOTE — Progress Notes (Signed)
PHARMACIST - PHYSICIAN COMMUNICATION   CONCERNING: Antibiotic IV to Oral Route Change Policy   RECOMMENDATION: This patient is receiving doxycycline by the intravenous route.  Based on criteria approved by the Pharmacy and Therapeutics Committee, the antibiotic(s) is/are being converted to the equivalent oral dose form(s).     DESCRIPTION:  These criteria include:  Patient being treated for a respiratory tract infection, urinary tract infection, cellulitis or clostridium difficile associated diarrhea if on metronidazole  The patient is not neutropenic and does not exhibit a GI malabsorption state  The patient is eating (either orally or via tube) and/or has been taking other orally administered medications for a least 24 hours  The patient is improving clinically and has a Tmax < 100.5   If you have questions about this conversion, please contact the Pharmacy Department  []   [phone] )  [phone] []   [phone] )  [facility] []   [phone] )  [facility] []   [phone] )  [facility] [x]   [phone] )  [facility]   ( [phone], PharmD, [place], Infectious Diseases Clinical Pharmacist Phone: [phone] 09/28/2020 8:27 AM

## 2020-09-28 NOTE — Discharge Summary (Signed)
Physician Discharge Summary  [name]:[id number] DOB: 1946-07-08 DOA: 09/26/2020  PCP: Laqueta Due., MD  Admit date: 09/26/2020 Discharge date: 09/28/2020  Admitted From: Home Discharge disposition: Home   Code Status: Full Code  Diet Recommendation: Regular diet  Discharge Diagnosis:   Active Problems:   AKI (acute kidney injury) [facility])   Chief Complaint  Patient presents with  . Emesis  . Abdominal Pain   Brief narrative: [name] is a 75 y.o. female with PMH significant for migraine, chronic pain, GERD, pneumonia. Patient presented to the ED on 3/14 from home with complaint of difficulty walking despite help from her husband.  In the ED, she had a low blood pressure down to 89/59 Labs showed sodium low at 128, creatinine significantly elevated to 2.03, lactic acid elevated to 2, hemoglobin low at 11.1 Urinalysis with hazy yellow urine, negative for nitrates Chest x-ray showed bilateral lower lobe infiltrates. Patient was admitted to hospitalist service  Subjective: Patient was seen and examined this morning.   Lying in bed.  Alert, awake, oriented x3.  Not in distress.  Not in pain.  Feels ready to go home.    Hospital course: Acute gastroenteritis -Presented with complaint of few days of nausea, vomiting and diarrhea improved.  Adequately hydrated.  Currently tolerating oral intake.  Bilateral lower lobe pneumonia -Probably aspirated while vomiting. -WBC count elevated, procalcitonin elevated. Lactic acid elevated probably because of dehydration. -Currently on IV Rocephin and IV doxycycline. -Discharge on oral Omnicef for next 5 days with probiotics.  AKI -Probably due to gastroenteritis and Mobic -Creatinine normal, elevated to 2.03 on admission.  Improved with IV fluid. -Stop Mobic.  Patient already on Norco for pain. Recent Labs    07/30/20 1735 08/15/20 1311 09/26/20 1110 09/27/20 0257 09/28/20 0301  BUN 17 11 34* 28* 11  CREATININE  2.07* 0.83 2.03* 1.39* 0.93   Chronic pain -Home meds include Norco 10/325 three times daily as needed, Mobic 7.5 mg daily as needed, -Continue Norco.  Stop Mobic  Anxiety/depression -Home meds include multiple mood altering medications including Seroquel, trazodone, Ambien and Xanax -Patient states that she gets this prescriptions from her psychiatrist.  Counseled to cut down on medication doses.  GERD -PPI  Stable for discharge home today.  Wound care: Incision (Closed) 09/27/15 Back Other (Comment) (Active)  Date First Assessed/Time First Assessed: 09/27/15 1136   Location: Back  Location Orientation: Other (Comment)    Assessments 09/27/2015 11:57 AM 10/03/2015  9:00 AM  Dressing Type Honeycomb Adhesive strips  Dressing Clean;Dry;Intact Intact  Margins -- Attached edges (approximated)  Closure -- Adhesive strips  Drainage Amount -- None     No Linked orders to display    Discharge Exam:   Vitals:   09/27/20 1326 09/27/20 1804 09/27/20 2100 09/28/20 0415  BP: 134/79 (!) 151/87 140/88 (!) 141/80  Pulse: 76 75 79 71  Resp: 16 16 16  (!) 22  Temp: 97.9 F (36.6 C) 98.7 F (37.1 C) 98.4 F (36.9 C) 98.4 F (36.9 C)  TempSrc:   Oral Oral  SpO2: 97% 98% 93% 94%  Weight:      Height:        Body mass index is 19.91 kg/m.  General exam: Pleasant, elderly Caucasian female.  Not in distress Skin: No rashes, lesions or ulcers. HEENT: Atraumatic, normocephalic, no obvious bleeding Lungs: Clear to auscultation bilaterally CVS: Regular rate and rhythm, no murmur GI/Abd soft, nontender, nondistended, bowel sound present CNS: Alert, awake monitor  x3 Psychiatry: Mood appropriate Extremities: No pedal edema, no calf tenderness  Follow ups:   Discharge Instructions    Diet general   Complete by: As directed    Increase activity slowly   Complete by: As directed       Follow-up Information    Kristen Loader Tor Netters., MD Follow up.   Specialty: Internal Medicine Contact  information: [address] [place] [address] [phone]               Recommendations for Outpatient Follow-Up:   1. Follow-up with PCP as an outpatient  Discharge Instructions:  Follow with Primary MD Laqueta Due., MD in 7 days   Get CBC/BMP checked in next visit within 1 week by PCP or SNF MD ( we routinely change or add medications that can affect your baseline labs and fluid status, therefore we recommend that you get the mentioned basic workup next visit with your PCP, your PCP may decide not to get them or add new tests based on their clinical decision)  On your next visit with your PCP, please Get Medicines reviewed and adjusted.  Please request your PCP  to go over all Hospital Tests and Procedure/Radiological results at the follow up, please get all Hospital records sent to your Prim MD by signing hospital release before you go home.  Activity: As tolerated with Full fall precautions use walker/cane & assistance as needed  For Heart failure patients - Check your Weight same time everyday, if you gain over 2 pounds, or you develop in leg swelling, experience more shortness of breath or chest pain, call your Primary MD immediately. Follow Cardiac Low Salt Diet and 1.5 lit/day fluid restriction.  If you have smoked or chewed Tobacco in the last 2 yrs please stop smoking, stop any regular Alcohol  and or any Recreational drug use.  If you experience worsening of your admission symptoms, develop shortness of breath, life threatening emergency, suicidal or homicidal thoughts you must seek medical attention immediately by calling 911 or calling your MD immediately  if symptoms less severe.  You Must read complete instructions/literature along with all the possible adverse reactions/side effects for all the Medicines you take and that have been prescribed to you. Take any new Medicines after you have completely understood and accpet all the possible adverse  reactions/side effects.   Do not drive, operate heavy machinery, perform activities at heights, swimming or participation in water activities or provide baby sitting services if your were admitted for syncope or siezures until you have seen by Primary MD or a Neurologist and advised to do so again.  Do not drive when taking Pain medications.  Do not take more than prescribed Pain, Sleep and Anxiety Medications  Wear Seat belts while driving.   Please note You were cared for by a hospitalist during your hospital stay. If you have any questions about your discharge medications or the care you received while you were in the hospital after you are discharged, you can call the unit and asked to speak with the hospitalist on call if the hospitalist that took care of you is not available. Once you are discharged, your primary care physician will handle any further medical issues. Please note that NO REFILLS for any discharge medications will be authorized once you are discharged, as it is imperative that you return to your primary care physician (or establish a relationship with a primary care physician if you do not have one) for  your aftercare needs so that they can reassess your need for medications and monitor your lab values.    Allergies as of 09/28/2020      Reactions   Sulfa Antibiotics Hives      Medication List    STOP taking these medications   cephALEXin 500 MG capsule Commonly known as: KEFLEX   meloxicam 7.5 MG tablet Commonly known as: MOBIC   polyethylene glycol 17 g packet Commonly known as: MIRALAX / GLYCOLAX     TAKE these medications   alendronate 70 MG tablet Commonly known as: FOSAMAX Take 70 mg by mouth once a week. Sunday   ALPRAZolam 1 MG tablet Commonly known as: XANAX Take 1 mg by mouth 3 (three) times daily as needed for anxiety or sleep.   cefdinir 300 MG capsule Commonly known as: OMNICEF Take 1 capsule (300 mg total) by mouth 2 (two) times daily for 5  days.   ergocalciferol 1.25 MG (50000 UT) capsule Commonly known as: VITAMIN D2 Take 1 capsule by mouth once a week.   escitalopram 20 MG tablet Commonly known as: LEXAPRO Take 20 mg by mouth daily.   guaiFENesin-dextromethorphan 100-10 MG/5ML syrup Commonly known as: ROBITUSSIN DM Take 5 mLs by mouth every 4 (four) hours as needed for cough.   HYDROcodone-acetaminophen 10-325 MG tablet Commonly known as: NORCO Take 1 tablet by mouth 3 (three) times daily as needed for pain.   omeprazole 20 MG capsule Commonly known as: PRILOSEC Take 20 mg by mouth 2 (two) times daily before a meal.   ondansetron 4 MG disintegrating tablet Commonly known as: Zofran ODT Take 1 tablet (4 mg total) by mouth every 8 (eight) hours as needed for nausea or vomiting.   QUEtiapine 300 MG tablet Commonly known as: SEROQUEL Take 600 mg by mouth at bedtime.   saccharomyces boulardii 250 MG capsule Commonly known as: FLORASTOR Take 1 capsule (250 mg total) by mouth 2 (two) times daily for 5 days.   SALINE MIST SPRAY NA Place 1 spray into the nose daily at 6 (six) AM.   sennosides-docusate sodium 8.6-50 MG tablet Commonly known as: SENOKOT-S Take 2 tablets by mouth at bedtime.   traZODone 100 MG tablet Commonly known as: DESYREL Take 200 mg by mouth at bedtime.   zolpidem 5 MG tablet Commonly known as: AMBIEN Take 2.5 mg by mouth at bedtime as needed for sleep.       Time coordinating discharge: 35 minutes  The results of significant diagnostics from this hospitalization (including imaging, microbiology, ancillary and laboratory) are listed below for reference.    Procedures and Diagnostic Studies:   DG Chest Port 1 View  Result Date: 09/26/2020 CLINICAL DATA:  Abdominal pain, nausea and vomiting. EXAM: PORTABLE CHEST 1 VIEW COMPARISON:  07/30/2020 FINDINGS: The cardiac silhouette, mediastinal and hilar contours are within normal limits given the AP projection, portable technique and low  lung volumes. Bilateral lower lobe airspace process, most likely pneumonia. No effusions or pneumothorax. The bony thorax is intact. Mildly distended bowel noted in the upper abdomen. IMPRESSION: Bilateral lower lobe infiltrates. Electronically Signed   By: Rudie MeyerP.  Gallerani M.D.   On: 09/26/2020 12:34     Labs:   Basic Metabolic Panel: Recent Labs  Lab 09/26/20 1110 09/27/20 0257 09/28/20 0301  NA 128* 133* 140  K 3.9 3.9 3.4*  CL 89* 101 111  CO2 27 24 18*  GLUCOSE 100* 87 88  BUN 34* 28* 11  CREATININE 2.03* 1.39* 0.93  CALCIUM  9.1 8.1* 8.6*   GFR Estimated Creatinine Clearance: 42.7 mL/min (by C-G formula based on SCr of 0.93 mg/dL). Liver Function Tests: Recent Labs  Lab 09/26/20 1110  AST 35  ALT 11  ALKPHOS 80  BILITOT 0.9  PROT 7.1  ALBUMIN 3.7   Recent Labs  Lab 09/26/20 1110  LIPASE 21   No results for input(s): AMMONIA in the last 168 hours. Coagulation profile No results for input(s): INR, PROTIME in the last 168 hours.  CBC: Recent Labs  Lab 09/26/20 1110 09/27/20 0257 09/28/20 0301  WBC 15.3* 9.4 8.5  HGB 11.1* 9.0* 9.1*  HCT 34.2* 27.4* 28.7*  MCV 90.0 90.1 91.4  PLT 191 138* 143*   Cardiac Enzymes: No results for input(s): CKTOTAL, CKMB, CKMBINDEX, TROPONINI in the last 168 hours. BNP: Invalid input(s): POCBNP CBG: No results for input(s): GLUCAP in the last 168 hours. D-Dimer No results for input(s): DDIMER in the last 72 hours. Hgb A1c No results for input(s): HGBA1C in the last 72 hours. Lipid Profile No results for input(s): CHOL, HDL, LDLCALC, TRIG, CHOLHDL, LDLDIRECT in the last 72 hours. Thyroid function studies No results for input(s): TSH, T4TOTAL, T3FREE, THYROIDAB in the last 72 hours.  Invalid input(s): FREET3 Anemia work up No results for input(s): VITAMINB12, FOLATE, FERRITIN, TIBC, IRON, RETICCTPCT in the last 72 hours. Microbiology Recent Results (from the past 240 hour(s))  Resp Panel by RT-PCR (Flu A&B, Covid)  Nasopharyngeal Swab     Status: None   Collection Time: 09/26/20 10:58 AM   Specimen: Nasopharyngeal Swab; Nasopharyngeal(NP) swabs in vial transport medium  Result Value Ref Range Status   SARS Coronavirus 2 by RT PCR NEGATIVE NEGATIVE Final    Comment: (NOTE) SARS-CoV-2 target nucleic acids are NOT DETECTED.  The SARS-CoV-2 RNA is generally detectable in upper respiratory specimens during the acute phase of infection. The lowest concentration of SARS-CoV-2 viral copies this assay can detect is 138 copies/mL. A negative result does not preclude SARS-Cov-2 infection and should not be used as the sole basis for treatment or other patient management decisions. A negative result may occur with  improper specimen collection/handling, submission of specimen other than nasopharyngeal swab, presence of viral mutation(s) within the areas targeted by this assay, and inadequate number of viral copies(<138 copies/mL). A negative result must be combined with clinical observations, patient history, and epidemiological information. The expected result is Negative.  Fact Sheet for Patients:  BloggerCourse.com  Fact Sheet for Healthcare Providers:  SeriousBroker.it  This test is no t yet approved or cleared by the [place] FDA and  has been authorized for detection and/or diagnosis of SARS-CoV-2 by FDA under an Emergency Use Authorization (EUA). This EUA will remain  in effect (meaning this test can be used) for the duration of the COVID-19 declaration under Section 564(b)(1) of the Act, 21 U.S.C.section 360bbb-3(b)(1), unless the authorization is terminated  or revoked sooner.       Influenza A by PCR NEGATIVE NEGATIVE Final   Influenza B by PCR NEGATIVE NEGATIVE Final    Comment: (NOTE) The Xpert Xpress SARS-CoV-2/FLU/RSV plus assay is intended as an aid in the diagnosis of influenza from Nasopharyngeal swab specimens and should not be  used as a sole basis for treatment. Nasal washings and aspirates are unacceptable for Xpert Xpress SARS-CoV-2/FLU/RSV testing.  Fact Sheet for Patients: BloggerCourse.com  Fact Sheet for Healthcare Providers: SeriousBroker.it  This test is not yet approved or cleared by the [place] and has been authorized for  detection and/or diagnosis of SARS-CoV-2 by FDA under an Emergency Use Authorization (EUA). This EUA will remain in effect (meaning this test can be used) for the duration of the COVID-19 declaration under Section 564(b)(1) of the Act, 21 U.S.C. section 360bbb-3(b)(1), unless the authorization is terminated or revoked.  Performed at [facility], [address], [place], [place] [address]   Culture, blood (routine x 2)     Status: None (Preliminary result)   Collection Time: 09/26/20 12:50 PM   Specimen: BLOOD RIGHT HAND  Result Value Ref Range Status   Specimen Description   Final    BLOOD RIGHT HAND Performed at [facility], [address], [place], [place] [address]    Special Requests   Final    BOTTLES DRAWN AEROBIC AND ANAEROBIC Blood Culture results may not be optimal due to an inadequate volume of blood received in culture bottles Performed at [facility], [address], [place], [place] [address]    Culture   Final    NO GROWTH 2 DAYS Performed at [facility], [address] [place], [place] [address]    Report Status PENDING  Incomplete  Culture, blood (routine x 2)     Status: None (Preliminary result)   Collection Time: 09/26/20  1:00 PM   Specimen: BLOOD LEFT FOREARM  Result Value Ref Range Status   Specimen Description   Final    BLOOD LEFT FOREARM Performed at [facility], [address] [place], [place] [address]    Special Requests   Final    BOTTLES DRAWN AEROBIC AND ANAEROBIC Blood Culture results may  not be optimal due to an inadequate volume of blood received in culture bottles Performed at [facility], [address] [place], [place] [address]    Culture   Final    NO GROWTH 2 DAYS Performed at [facility], [address] [place], [place] [address]    Report Status PENDING  Incomplete     Signed: Melina Schools Graylee Arutyunyan  [facility] 09/28/2020, 10:37 AM

## 2020-10-01 LAB — CULTURE, BLOOD (ROUTINE X 2)
Culture: NO GROWTH
Culture: NO GROWTH

## 2020-12-21 ENCOUNTER — Emergency Department (HOSPITAL_COMMUNITY): Payer: Medicare Other

## 2020-12-21 ENCOUNTER — Other Ambulatory Visit: Payer: Self-pay

## 2020-12-21 ENCOUNTER — Encounter (HOSPITAL_COMMUNITY): Payer: Self-pay | Admitting: *Deleted

## 2020-12-21 ENCOUNTER — Inpatient Hospital Stay (HOSPITAL_COMMUNITY)
Admission: EM | Admit: 2020-12-21 | Discharge: 2020-12-23 | DRG: 690 | Disposition: A | Discharge status: Home Health | Payer: Medicare Other | Attending: Family Medicine | Admitting: Family Medicine

## 2020-12-21 DIAGNOSIS — I10 Essential (primary) hypertension: Secondary | ICD-10-CM | POA: Diagnosis present

## 2020-12-21 DIAGNOSIS — Z8249 Family history of ischemic heart disease and other diseases of the circulatory system: Secondary | ICD-10-CM | POA: Diagnosis not present

## 2020-12-21 DIAGNOSIS — G8929 Other chronic pain: Secondary | ICD-10-CM | POA: Diagnosis present

## 2020-12-21 DIAGNOSIS — E876 Hypokalemia: Secondary | ICD-10-CM | POA: Diagnosis present

## 2020-12-21 DIAGNOSIS — Z7983 Long term (current) use of bisphosphonates: Secondary | ICD-10-CM | POA: Diagnosis not present

## 2020-12-21 DIAGNOSIS — Z981 Arthrodesis status: Secondary | ICD-10-CM | POA: Diagnosis not present

## 2020-12-21 DIAGNOSIS — Z20822 Contact with and (suspected) exposure to covid-19: Secondary | ICD-10-CM | POA: Diagnosis present

## 2020-12-21 DIAGNOSIS — F1120 Opioid dependence, uncomplicated: Secondary | ICD-10-CM | POA: Diagnosis present

## 2020-12-21 DIAGNOSIS — F32A Depression, unspecified: Secondary | ICD-10-CM | POA: Diagnosis present

## 2020-12-21 DIAGNOSIS — G43909 Migraine, unspecified, not intractable, without status migrainosus: Secondary | ICD-10-CM | POA: Diagnosis present

## 2020-12-21 DIAGNOSIS — B961 Klebsiella pneumoniae [K. pneumoniae] as the cause of diseases classified elsewhere: Secondary | ICD-10-CM | POA: Diagnosis present

## 2020-12-21 DIAGNOSIS — F419 Anxiety disorder, unspecified: Secondary | ICD-10-CM | POA: Diagnosis present

## 2020-12-21 DIAGNOSIS — E871 Hypo-osmolality and hyponatremia: Secondary | ICD-10-CM | POA: Diagnosis not present

## 2020-12-21 DIAGNOSIS — N390 Urinary tract infection, site not specified: Secondary | ICD-10-CM | POA: Diagnosis not present

## 2020-12-21 DIAGNOSIS — N3000 Acute cystitis without hematuria: Secondary | ICD-10-CM

## 2020-12-21 DIAGNOSIS — G4700 Insomnia, unspecified: Secondary | ICD-10-CM | POA: Diagnosis present

## 2020-12-21 DIAGNOSIS — Z882 Allergy status to sulfonamides status: Secondary | ICD-10-CM | POA: Diagnosis not present

## 2020-12-21 DIAGNOSIS — Z79899 Other long term (current) drug therapy: Secondary | ICD-10-CM

## 2020-12-21 DIAGNOSIS — G894 Chronic pain syndrome: Secondary | ICD-10-CM | POA: Diagnosis present

## 2020-12-21 DIAGNOSIS — K219 Gastro-esophageal reflux disease without esophagitis: Secondary | ICD-10-CM | POA: Diagnosis present

## 2020-12-21 DIAGNOSIS — E860 Dehydration: Secondary | ICD-10-CM | POA: Diagnosis present

## 2020-12-21 DIAGNOSIS — R531 Weakness: Secondary | ICD-10-CM

## 2020-12-21 LAB — BASIC METABOLIC PANEL
Anion gap: 9 (ref 5–15)
Anion gap: 9 (ref 5–15)
BUN: 11 mg/dL (ref 8–23)
BUN: 10 mg/dL (ref 8–23)
CO2: 28 mmol/L (ref 22–32)
CO2: 26 mmol/L (ref 22–32)
Calcium: 9 mg/dL (ref 8.9–10.3)
Calcium: 9.5 mg/dL (ref 8.9–10.3)
Chloride: 86 mmol/L — ABNORMAL LOW (ref 98–111)
Chloride: 96 mmol/L — ABNORMAL LOW (ref 98–111)
Creatinine, Ser: 0.78 mg/dL (ref 0.44–1.00)
Creatinine, Ser: 0.72 mg/dL (ref 0.44–1.00)
GFR, Estimated: >60 mL/min (ref >60)
GFR, Estimated: >60 mL/min (ref >60)
Glucose, Bld: 121 mg/dL — ABNORMAL HIGH (ref 70–99)
Glucose, Bld: 95 mg/dL (ref 70–99)
Potassium: 3.2 mmol/L — ABNORMAL LOW (ref 3.5–5.1)
Potassium: 3.8 mmol/L (ref 3.5–5.1)
Sodium: 123 mmol/L — ABNORMAL LOW (ref 135–145)
Sodium: 131 mmol/L — ABNORMAL LOW (ref 135–145)

## 2020-12-21 LAB — CBC WITH DIFFERENTIAL/PLATELET
Abs Immature Granulocytes: 0.03 10*3/uL (ref 0.00–0.07)
Basophils Absolute: 0 10*3/uL (ref 0.0–0.1)
Basophils Relative: 0 %
Eosinophils Absolute: 0.1 10*3/uL (ref 0.0–0.5)
Eosinophils Relative: 1 %
HCT: 31.8 % — ABNORMAL LOW (ref 36.0–46.0)
Hemoglobin: 10.8 g/dL — ABNORMAL LOW (ref 12.0–15.0)
Immature Granulocytes: 0 %
Lymphocytes Relative: 16 %
Lymphs Abs: 1.1 10*3/uL (ref 0.7–4.0)
MCH: 29.2 pg (ref 26.0–34.0)
MCHC: 34 g/dL (ref 30.0–36.0)
MCV: 85.9 fL (ref 80.0–100.0)
Monocytes Absolute: 0.6 10*3/uL (ref 0.1–1.0)
Monocytes Relative: 8 %
Neutro Abs: 5.2 10*3/uL (ref 1.7–7.7)
Neutrophils Relative %: 75 %
Platelets: 170 10*3/uL (ref 150–400)
RBC: 3.7 MIL/uL — ABNORMAL LOW (ref 3.87–5.11)
RDW: 13.7 % (ref 11.5–15.5)
WBC: 7 10*3/uL (ref 4.0–10.5)
nRBC: 0 % (ref 0.0–0.2)

## 2020-12-21 LAB — URINALYSIS, ROUTINE W REFLEX MICROSCOPIC
Bilirubin Urine: NEGATIVE
Glucose, UA: NEGATIVE mg/dL
Hgb urine dipstick: NEGATIVE
Ketones, ur: NEGATIVE mg/dL
Nitrite: NEGATIVE
Protein, ur: NEGATIVE mg/dL
Specific Gravity, Urine: 1.003 — ABNORMAL LOW (ref 1.005–1.030)
WBC, UA: >50 WBC/hpf — ABNORMAL HIGH (ref 0–5)
pH: 8 (ref 5.0–8.0)

## 2020-12-21 LAB — MAGNESIUM: Magnesium: 2.2 mg/dL (ref 1.7–2.4)

## 2020-12-21 LAB — RESP PANEL BY RT-PCR (FLU A&B, COVID) ARPGX2
Influenza A by PCR: NEGATIVE
Influenza B by PCR: NEGATIVE
SARS Coronavirus 2 by RT PCR: NEGATIVE

## 2020-12-21 LAB — OSMOLALITY: Osmolality: 264 mOsm/kg — ABNORMAL LOW (ref 275–295)

## 2020-12-21 LAB — OSMOLALITY, URINE: Osmolality, Ur: 122 mOsm/kg — ABNORMAL LOW (ref 300–900)

## 2020-12-21 MED ORDER — SENNOSIDES-DOCUSATE SODIUM 8.6-50 MG PO TABS
2.0000 | ORAL_TABLET | Freq: Every day | ORAL | Status: DC
  Administered 2020-12-21 – 2020-12-22 (×2): 2 via ORAL
  Filled 2020-12-21 (×2): qty 2

## 2020-12-21 MED ORDER — ONDANSETRON HCL 4 MG PO TABS: 4.0000 mg | ORAL_TABLET | Freq: Four times a day (QID) | ORAL | Status: DC | PRN

## 2020-12-21 MED ORDER — POTASSIUM CHLORIDE CRYS ER 20 MEQ PO TBCR
40.0000 meq | EXTENDED_RELEASE_TABLET | Freq: Once | ORAL | Status: AC
  Administered 2020-12-21: 40 meq via ORAL
  Filled 2020-12-21: qty 2

## 2020-12-21 MED ORDER — PANTOPRAZOLE SODIUM 40 MG PO TBEC
40.0000 mg | DELAYED_RELEASE_TABLET | Freq: Every day | ORAL | Status: DC
  Administered 2020-12-21 – 2020-12-23 (×3): 40 mg via ORAL
  Filled 2020-12-21 (×3): qty 1

## 2020-12-21 MED ORDER — SODIUM CHLORIDE 0.9 % IV SOLN
1.0000 g | INTRAVENOUS | Status: DC
  Administered 2020-12-21 – 2020-12-23 (×3): 1 g via INTRAVENOUS
  Filled 2020-12-21 (×4): qty 10

## 2020-12-21 MED ORDER — POTASSIUM CHLORIDE 20 MEQ PO PACK: 40.0000 meq | PACK | Freq: Once | ORAL | Status: DC

## 2020-12-21 MED ORDER — SODIUM CHLORIDE 0.9 % IV BOLUS (SEPSIS)
500.0000 mL | Freq: Once | INTRAVENOUS | Status: AC
  Administered 2020-12-21: 500 mL via INTRAVENOUS

## 2020-12-21 MED ORDER — ACETAMINOPHEN 325 MG PO TABS: 650.0000 mg | ORAL_TABLET | Freq: Four times a day (QID) | ORAL | Status: DC | PRN

## 2020-12-21 MED ORDER — TRAZODONE HCL 100 MG PO TABS
200.0000 mg | ORAL_TABLET | Freq: Every day | ORAL | Status: DC
  Administered 2020-12-21 – 2020-12-22 (×2): 200 mg via ORAL
  Filled 2020-12-21 (×2): qty 2

## 2020-12-21 MED ORDER — ENOXAPARIN SODIUM 40 MG/0.4ML IJ SOSY
40.0000 mg | PREFILLED_SYRINGE | INTRAMUSCULAR | Status: DC
  Administered 2020-12-21 – 2020-12-22 (×2): 40 mg via SUBCUTANEOUS
  Filled 2020-12-21 (×2): qty 0.4

## 2020-12-21 MED ORDER — ZOLPIDEM TARTRATE 5 MG PO TABS
2.5000 mg | ORAL_TABLET | Freq: Every day | ORAL | Status: DC
  Administered 2020-12-21 – 2020-12-22 (×2): 2.5 mg via ORAL
  Filled 2020-12-21 (×2): qty 1

## 2020-12-21 MED ORDER — SODIUM CHLORIDE 0.9 % IV SOLN
1000.0000 mL | INTRAVENOUS | Status: DC
  Administered 2020-12-21: 1000 mL via INTRAVENOUS

## 2020-12-21 MED ORDER — CEPHALEXIN 250 MG PO CAPS
250.0000 mg | ORAL_CAPSULE | Freq: Once | ORAL | Status: DC
  Filled 2020-12-21: qty 1

## 2020-12-21 MED ORDER — ONDANSETRON HCL 4 MG/2ML IJ SOLN: 4.0000 mg | Freq: Four times a day (QID) | INTRAMUSCULAR | Status: DC | PRN

## 2020-12-21 MED ORDER — ESCITALOPRAM OXALATE 20 MG PO TABS
20.0000 mg | ORAL_TABLET | Freq: Every day | ORAL | Status: DC
  Administered 2020-12-21 – 2020-12-22 (×2): 20 mg via ORAL
  Filled 2020-12-21 (×2): qty 1

## 2020-12-21 MED ORDER — ACETAMINOPHEN 650 MG RE SUPP: 650.0000 mg | Freq: Four times a day (QID) | RECTAL | Status: DC | PRN

## 2020-12-21 MED ORDER — ALPRAZOLAM 1 MG PO TABS
1.0000 mg | ORAL_TABLET | Freq: Every day | ORAL | Status: DC
  Administered 2020-12-21 – 2020-12-22 (×2): 1 mg via ORAL
  Filled 2020-12-21 (×2): qty 1

## 2020-12-21 MED ORDER — HYDROCODONE-ACETAMINOPHEN 10-325 MG PO TABS
1.0000 | ORAL_TABLET | Freq: Four times a day (QID) | ORAL | Status: DC | PRN
  Administered 2020-12-21 – 2020-12-23 (×7): 1 via ORAL
  Filled 2020-12-21 (×7): qty 1

## 2020-12-21 NOTE — ED Provider Notes (Signed)
Emergency Medicine Provider Triage Evaluation Note  [name] , a 75 y.o. female  was evaluated in triage.  Pt complains of shakiness in her extremities that is causing her to have difficulty walking x4 days.  She states this happened in the past when her potassium was low.  She also endorses chronic back pain, unchanged today.  Denies any new medications, drug or alcohol use.  Review of Systems  Positive: Tremor  Negative: Fever, chest pain, visual changes  Physical Exam  BP 107/61 (BP Location: Left Arm)   Pulse 91   Temp 98.1 F (36.7 C) (Oral)   Resp 15   SpO2 95%  Gen:   Awake, no distress   Resp:  Normal effort  MSK:   Moves extremities without difficulty. Tremor noted in bilateral hands when holding them out straight Other:  Strong and equal grip strength in all extremities, no facial droop, clear speech.  Medical Decision Making  Medically screening exam initiated at 12:26 PM.  Appropriate orders placed.  AVAREE GILBERTI was informed that the remainder of the evaluation will be completed by another provider, this initial triage assessment does not replace that evaluation, and the importance of remaining in the ED until their evaluation is complete.  Basic labs ordered.   Portions of this note were generated with Scientist, clinical (histocompatibility and immunogenetics). Dictation errors may occur despite best attempts at proofreading.    Shanon Ace, PA-C 12/21/20 1229    Wynetta Fines, MD 12/21/20 [phone]

## 2020-12-21 NOTE — ED Provider Notes (Signed)
[facility]-EMERGENCY DEPT Provider Note   CSN: [id number] Arrival date & time: 12/21/20  1155     History Chief Complaint  Patient presents with  . Tremors  . Difficulty Walking    [name] is a 75 y.o. female.  HPI   Patient presents to the ED for evaluation of tremulousness and difficulty walking.  Patient states the symptoms have been ongoing for about 4 days.  They have been progressing and today it was severe enough to make her come in for evaluation.  Patient states she has been able to walk but she has to hold onto things because of her unsteadiness.  She feels very shaky throughout not just in her legs.  She does have back pain that is chronic and not any different than usual.  She denies any headache.  No trouble with her speech.  She denies any fevers or chills.  No vomiting or diarrhea.  No recent medication changes  Past Medical History:  Diagnosis Date  . Anxiety   . Chronic pain   . Constipation   . GERD (gastroesophageal reflux disease)   . History of blood transfusion   . Migraine    last one since 05/2015  . Pneumonia 2014ish    Patient Active Problem List   Diagnosis Date Noted  . AKI (acute kidney injury) (HCC) 09/26/2020  . Altered mental status 09/30/2015  . Fever 09/30/2015  . Insomnia 09/30/2015  . Fever and chills 09/30/2015  . Lumbar stenosis 09/27/2015  . Gastro-esophageal reflux disease without esophagitis 07/15/2015  . Adaptive colitis 07/15/2015  . Incomplete bladder emptying 07/15/2015  . Excessive urination at night 07/15/2015  . Nasal septal perforation 07/15/2015  . Polyneuropathy, postherpetic 07/15/2015  . Compression fracture 09/01/2014  . Pars defect 04/24/2014  . Pneumonia 04/11/2014  . Constipation due to opioid therapy 01/29/2014  . Benzodiazepine overdose, unintentional 05/26/2013  . Polypharmacy 05/24/2013  . Hypokalemia 05/24/2013  . Unstable gait 05/24/2013  . Anxiety 05/24/2013  . Chronic  pain 05/24/2013  . Clinical depression 08/08/2012  . Ilioinguinal neuralgia 04/15/2012  . Abdominal pain 02/06/2012  . Continuous opioid dependence (HCC) 02/06/2012    Past Surgical History:  Procedure Laterality Date  . ABDOMINAL WALL MESH  REMOVAL    . APPENDECTOMY    . BLADDER SURGERY     Mesh implanted  . CATARACT EXTRACTION W/ INTRAOCULAR LENS  IMPLANT, BILATERAL Bilateral   . COLONOSCOPY    . KYPHOPLASTY N/A 09/01/2014   Procedure: KYPHOPLASTY;  Surgeon: Emilee Hero, MD;  Location: [facility] OR;  Service: Orthopedics;  Laterality: N/A;  Lumbar 1 kyphoplasty  . LAMINECTOMY WITH POSTERIOR LATERAL ARTHRODESIS LEVEL 1 N/A 09/27/2015   Procedure: L3-4, 4-5 Laminectomy with posterior lateral arthrodesis;  Surgeon: Hilda Lias, MD;  Location: [facility] NEURO ORS;  Service: Neurosurgery;  Laterality: N/A;  L 4-5 Laminectomy with posterior lateral arthrodesis, partial L3 laminectomy  . OOPHORECTOMY Right   . SHOULDER SURGERY Left    X 2  . TONSILLECTOMY       OB History   No obstetric history on file.     Family History  Problem Relation Age of Onset  . Coronary artery disease Father 63  . Coronary artery disease Mother 17    Social History   Tobacco Use  . Smoking status: Never Smoker  . Smokeless tobacco: Never Used  Vaping Use  . Vaping Use: Never used  Substance Use Topics  . Alcohol use: No  . Drug use:  No    Home Medications Prior to Admission medications   Medication Sig Start Date End Date Taking? Authorizing Provider  alendronate (FOSAMAX) 70 MG tablet Take 70 mg by mouth once a week. Sunday 07/14/20  Yes [provider]  ALPRAZolam Prudy Feeler) 1 MG tablet Take 1 mg by mouth at bedtime.   Yes [provider]  ergocalciferol (VITAMIN D2) 1.25 MG (50000 UT) capsule Take 1 capsule by mouth once a week. 11/26/16  Yes [provider]  escitalopram (LEXAPRO) 20 MG tablet Take 20 mg by mouth at bedtime. 06/14/20  Yes [provider]   HYDROcodone-acetaminophen (NORCO) 10-325 MG tablet Take 1 tablet by mouth 3 (three) times daily as needed for pain. 08/12/20  Yes [provider]  omeprazole (PRILOSEC) 20 MG capsule Take 20 mg by mouth 2 (two) times daily before a meal.   Yes [provider]  QUEtiapine (SEROQUEL) 300 MG tablet Take 600 mg by mouth at bedtime. 09/19/15  Yes [provider]  SALINE MIST SPRAY NA Place 1 spray into the nose daily at 6 (six) AM.   Yes [provider]  sennosides-docusate sodium (SENOKOT-S) 8.6-50 MG tablet Take 2 tablets by mouth at bedtime.   Yes [provider]  traZODone (DESYREL) 100 MG tablet Take 200 mg by mouth at bedtime. 01/31/13  Yes [provider]  zolpidem (AMBIEN) 5 MG tablet Take 2.5 mg by mouth at bedtime. 06/17/20  Yes [provider]  guaiFENesin-dextromethorphan (ROBITUSSIN DM) 100-10 MG/5ML syrup Take 5 mLs by mouth every 4 (four) hours as needed for cough. Patient not taking: No sig reported 09/28/20   Lorin Glass, MD  ondansetron (ZOFRAN ODT) 4 MG disintegrating tablet Take 1 tablet (4 mg total) by mouth every 8 (eight) hours as needed for nausea or vomiting. Patient not taking: No sig reported 08/15/20   Cheryll Cockayne, MD    Allergies    Sulfa antibiotics  Review of Systems   Review of Systems  All other systems reviewed and are negative.   Physical Exam Updated Vital Signs BP 135/83   Pulse 76   Temp 98.1 F (36.7 C) (Oral)   Resp 16   SpO2 97%   Physical Exam Vitals and nursing note reviewed.  Constitutional:      General: She is not in acute distress.    Appearance: She is well-developed.  HENT:     Head: Normocephalic and atraumatic.     Right Ear: External ear normal.     Left Ear: External ear normal.  Eyes:     General: No scleral icterus.       Right eye: No discharge.        Left eye: No discharge.     Conjunctiva/sclera: Conjunctivae normal.  Neck:     Trachea: No tracheal  deviation.  Cardiovascular:     Rate and Rhythm: Normal rate and regular rhythm.  Pulmonary:     Effort: Pulmonary effort is normal. No respiratory distress.     Breath sounds: Normal breath sounds. No stridor. No wheezing or rales.  Abdominal:     General: Bowel sounds are normal. There is no distension.     Palpations: Abdomen is soft.     Tenderness: There is no abdominal tenderness. There is no guarding or rebound.  Musculoskeletal:        General: No tenderness.     Cervical back: Neck supple.  Skin:    General: Skin is warm and dry.  Findings: No rash.  Neurological:     Mental Status: She is alert and oriented to person, place, and time.     Cranial Nerves: No cranial nerve deficit (no facial droop, extraocular movements intact, no slurred speech).     Sensory: No sensory deficit.     Motor: Weakness ( Generalized weakness, more difficulty lifting both legs off the bed than her arms) and tremor present. No abnormal muscle tone or seizure activity.     Coordination: Coordination normal.     Comments: No pronator drift bilateral upper extrem, able to hold both legs off bed for 5 seconds, sensation intact in all extremities, no visual field cuts, no left or right sided neglect, normal finger-nose exam bilaterally, no nystagmus noted      ED Results / Procedures / Treatments   Labs (all labs ordered are listed, but only abnormal results are displayed) Labs Reviewed  CBC WITH DIFFERENTIAL/PLATELET - Abnormal; Notable for the following components:      Result Value   RBC 3.70 (*)    Hemoglobin 10.8 (*)    HCT 31.8 (*)    All other components within normal limits  BASIC METABOLIC PANEL - Abnormal; Notable for the following components:   Sodium 123 (*)    Potassium 3.2 (*)    Chloride 86 (*)    Glucose, Bld 121 (*)    All other components within normal limits  URINALYSIS, ROUTINE W REFLEX MICROSCOPIC - Abnormal; Notable for the following components:   APPearance HAZY (*)     Specific Gravity, Urine 1.003 (*)    Leukocytes,Ua LARGE (*)    WBC, UA >50 (*)    Bacteria, UA RARE (*)    Non Squamous Epithelial 0-5 (*)    All other components within normal limits  URINE CULTURE  MAGNESIUM  OSMOLALITY, URINE  OSMOLALITY  SODIUM, URINE, RANDOM    EKG None  Radiology CT Head Wo Contrast  Result Date: 12/21/2020 CLINICAL DATA:  Shakiness Difficulty walking without assistance for 3-4 days. EXAM: CT HEAD WITHOUT CONTRAST TECHNIQUE: Contiguous axial images were obtained from the base of the skull through the vertex without intravenous contrast. COMPARISON:  09/30/2015 FINDINGS: Brain: No evidence of acute infarction, hemorrhage, hydrocephalus, extra-axial collection or mass lesion/mass effect. Vascular: No hyperdense vessel or unexpected calcification. Skull: Normal. Negative for fracture or focal lesion. Sinuses/Orbits: Partial opacification right mastoid air cells and sphenoid sinuses. Other: None. IMPRESSION: No acute intracranial abnormality. Electronically Signed   By: Acquanetta Belling M.D.   On: 12/21/2020 14:49    Procedures Procedures   Medications Ordered in ED Medications  sodium chloride 0.9 % bolus 500 mL (500 mLs Intravenous New Bag/Given 12/21/20 1431)    Followed by  0.9 %  sodium chloride infusion (1,000 mLs Intravenous New Bag/Given 12/21/20 1432)  potassium chloride SA (KLOR-CON) CR tablet 40 mEq (40 mEq Oral Given 12/21/20 1433)    ED Course  I have reviewed the triage vital signs and the nursing notes.  Pertinent labs & imaging results that were available during my care of the patient were reviewed by me and considered in my medical decision making (see chart for details).  Clinical Course as of 12/21/20 1559  Wed Dec 21, 2020  1406 CBC shows a stable hemoglobin.  Magnesium is normal. [JK]  1406 Sodium potassium and chloride are significantly decreased compared to previous.  Urinalysis is suggestive of a urinary tract infection [JK]  1459 Head CT  without acute findings [JK]  Clinical Course User Index [JK] Linwood Dibbles, MD   MDM Rules/Calculators/A&P                          Patient presented to the ED for evaluation of shakiness and weakness.  Patient states she has had difficulty walking and has to hold onto things.  Normally she does not have that issue.  Patient's laboratory tests are notable for hyponatremia.  Her sodium was 123.  2 months ago her sodium levels at 140.  Patient also has evidence of urinary tract infection.  CT scan does not show any acute issues.  Patient is not on any diuretic medications.  I will send off serum and urine osmolality.  I will consult with the medical service for further evaluation. Final Clinical Impression(s) / ED Diagnoses Final diagnoses:  Hyponatremia  Weakness      Linwood Dibbles, MD 12/21/20 [phone]

## 2020-12-21 NOTE — ED Triage Notes (Signed)
Pt reports shakiness, difficulty walking without assistance 3-4 days ago. She reports similar symptoms in the past but does not recall what was wrong.

## 2020-12-21 NOTE — H&P (Addendum)
History and Physical    [name] OAC:[id number] DOB: Nov 15, 1945 DOA: 12/21/2020  PCP: Laqueta Due., MD   Patient coming from:  Home  I have personally briefly reviewed patient's old medical records in [facility]  Chief Complaint:  Generalized weakness / Tremors  HPI: [name] is a 75 y.o. female with medical history significant of major depression, chronic anxiety disorder, chronic pain syndrome, migraine presents in the ED with generalized weakness and shakiness for last 4 days.  Patient reports symptoms are getting worse and are progressive. She denies any fall, trauma, headache, nausea, vomiting, abdominal pain, cough, recent travel, sick contacts.  She reports she was able to walk prior to the symptoms 4 days ago. She recalls similar symptoms in the past and was admitted and found to have low potassium.  Patient reported chronic back pain and takes chronic pain medications for long time.  She denies any worsening of back pain.  She also reports increased urinary frequency associated with pain but denies any burning or blood in the urine.   ED Course: She was hemodynamically stable except hypertension. Temp 98.1, HR 92,RR 18,BP 165/87, SPO2 97% on room air. Labs includes sodium 123, potassium 3.2, chloride 86, bicarb 28, glucose 121, BUN 11, creatinine 0.78, calcium 9.2, magnesium 2.2 WBC 7.0 hemoglobin 10.8 hematocrit 31.8 MCV 89.5 platelet 170, UA large leukocyte Estrace nitrites negative.  Urine culture sent.  CT head no acute intracranial abnormality.  Review of Systems: Review of Systems  Constitutional: Positive for malaise/fatigue.  HENT: Negative.   Eyes: Negative.   Respiratory: Negative.   Cardiovascular: Negative.   Gastrointestinal: Negative.   Genitourinary: Positive for dysuria and frequency.  Musculoskeletal: Positive for back pain.  Skin: Negative.   Neurological: Positive for tremors and weakness.  Endo/Heme/Allergies: Negative.    Psychiatric/Behavioral: Positive for depression. The patient has insomnia.      Past Medical History:  Diagnosis Date  . Anxiety   . Chronic pain   . Constipation   . GERD (gastroesophageal reflux disease)   . History of blood transfusion   . Migraine    last one since 05/2015  . Pneumonia 2014ish    Past Surgical History:  Procedure Laterality Date  . ABDOMINAL WALL MESH  REMOVAL    . APPENDECTOMY    . BLADDER SURGERY     Mesh implanted  . CATARACT EXTRACTION W/ INTRAOCULAR LENS  IMPLANT, BILATERAL Bilateral   . COLONOSCOPY    . KYPHOPLASTY N/A 09/01/2014   Procedure: KYPHOPLASTY;  Surgeon: Emilee Hero, MD;  Location: [facility] OR;  Service: Orthopedics;  Laterality: N/A;  Lumbar 1 kyphoplasty  . LAMINECTOMY WITH POSTERIOR LATERAL ARTHRODESIS LEVEL 1 N/A 09/27/2015   Procedure: L3-4, 4-5 Laminectomy with posterior lateral arthrodesis;  Surgeon: Hilda Lias, MD;  Location: [facility] NEURO ORS;  Service: Neurosurgery;  Laterality: N/A;  L 4-5 Laminectomy with posterior lateral arthrodesis, partial L3 laminectomy  . OOPHORECTOMY Right   . SHOULDER SURGERY Left    X 2  . TONSILLECTOMY       reports that she has never smoked. She has never used smokeless tobacco. She reports that she does not drink alcohol and does not use drugs.  Allergies  Allergen Reactions  . Sulfa Antibiotics Hives    Family History  Problem Relation Age of Onset  . Coronary artery disease Father 60  . Coronary artery disease Mother 12   Family history reviewed and not pertinent .  Prior to Admission medications  Medication Sig Start Date End Date Taking? Authorizing Provider  alendronate (FOSAMAX) 70 MG tablet Take 70 mg by mouth once a week. Sunday 07/14/20  Yes [provider]  ALPRAZolam Prudy Feeler) 1 MG tablet Take 1 mg by mouth at bedtime.   Yes [provider]  ergocalciferol (VITAMIN D2) 1.25 MG (50000 UT) capsule Take 1 capsule by mouth once a week. 11/26/16  Yes [provider]  escitalopram (LEXAPRO) 20 MG tablet Take 20 mg by mouth at bedtime. 06/14/20  Yes [provider]  HYDROcodone-acetaminophen (NORCO) 10-325 MG tablet Take 1 tablet by mouth 3 (three) times daily as needed for pain. 08/12/20  Yes [provider]  omeprazole (PRILOSEC) 20 MG capsule Take 20 mg by mouth 2 (two) times daily before a meal.   Yes [provider]  QUEtiapine (SEROQUEL) 300 MG tablet Take 600 mg by mouth at bedtime. 09/19/15  Yes [provider]  SALINE MIST SPRAY NA Place 1 spray into the nose daily at 6 (six) AM.   Yes [provider]  sennosides-docusate sodium (SENOKOT-S) 8.6-50 MG tablet Take 2 tablets by mouth at bedtime.   Yes [provider]  traZODone (DESYREL) 100 MG tablet Take 200 mg by mouth at bedtime. 01/31/13  Yes [provider]  zolpidem (AMBIEN) 5 MG tablet Take 2.5 mg by mouth at bedtime. 06/17/20  Yes [provider]  guaiFENesin-dextromethorphan (ROBITUSSIN DM) 100-10 MG/5ML syrup Take 5 mLs by mouth every 4 (four) hours as needed for cough. Patient not taking: No sig reported 09/28/20   Lorin Glass, MD  ondansetron (ZOFRAN ODT) 4 MG disintegrating tablet Take 1 tablet (4 mg total) by mouth every 8 (eight) hours as needed for nausea or vomiting. Patient not taking: No sig reported 08/15/20   Cheryll Cockayne, MD    Physical Exam: Vitals:   12/21/20 1341 12/21/20 1430 12/21/20 1530 12/21/20 1650  BP: 116/77 123/75 135/83 (!) 165/87  Pulse: 82 78 76 92  Resp: 18 18 16 18   Temp:      TempSrc:      SpO2: 97% 96% 97% 97%    Constitutional: NAD, calm, comfortable Vitals:   12/21/20 1341 12/21/20 1430 12/21/20 1530 12/21/20 1650  BP: 116/77 123/75 135/83 (!) 165/87  Pulse: 82 78 76 92  Resp: 18 18 16 18   Temp:      TempSrc:      SpO2: 97% 96% 97% 97%   Eyes: PERRL, lids and conjunctivae normal. ENMT: Mucous membranes are moist. Posterior pharynx clear of any exudate or  lesions.Normal dentition.  Neck: normal, supple, no masses, no thyromegaly Respiratory: clear to auscultation bilaterally, no wheezing, no crackles. Normal respiratory effort. No accessory muscle use.  Cardiovascular: Regular rate and rhythm, no murmurs / rubs / gallops. No extremity edema. 2+ pedal pulses. No carotid bruits.  Abdomen: no tenderness, no masses palpated. No hepatosplenomegaly. Bowel sounds positive.  Musculoskeletal: no clubbing / cyanosis. No joint deformity upper and lower extremities. Good ROM, no contractures. Normal muscle tone.  Skin: no rashes, lesions, ulcers. No induration Neurologic: CN 2-12 grossly intact. Sensation intact, DTR normal. Strength 5/5 in all 4.  Psychiatric: Normal judgment and insight. Alert and oriented x 3. Normal mood.    Labs on Admission: I have personally reviewed following labs and imaging studies  CBC: Recent Labs  Lab 12/21/20 1240  WBC 7.0  NEUTROABS 5.2  HGB 10.8*  HCT 31.8*  MCV 85.9  PLT 170   Basic Metabolic Panel:  Recent Labs  Lab 12/21/20 1240  NA 123*  K 3.2*  CL 86*  CO2 28  GLUCOSE 121*  BUN 11  CREATININE 0.78  CALCIUM 9.0  MG 2.2   GFR: CrCl cannot be calculated (Unknown ideal weight.). Liver Function Tests: No results for input(s): AST, ALT, ALKPHOS, BILITOT, PROT, ALBUMIN in the last 168 hours. No results for input(s): LIPASE, AMYLASE in the last 168 hours. No results for input(s): AMMONIA in the last 168 hours. Coagulation Profile: No results for input(s): INR, PROTIME in the last 168 hours. Cardiac Enzymes: No results for input(s): CKTOTAL, CKMB, CKMBINDEX, TROPONINI in the last 168 hours. BNP (last 3 results) No results for input(s): PROBNP in the last 8760 hours. HbA1C: No results for input(s): HGBA1C in the last 72 hours. CBG: No results for input(s): GLUCAP in the last 168 hours. Lipid Profile: No results for input(s): CHOL, HDL, LDLCALC, TRIG, CHOLHDL, LDLDIRECT in the last 72  hours. Thyroid Function Tests: No results for input(s): TSH, T4TOTAL, FREET4, T3FREE, THYROIDAB in the last 72 hours. Anemia Panel: No results for input(s): VITAMINB12, FOLATE, FERRITIN, TIBC, IRON, RETICCTPCT in the last 72 hours. Urine analysis:    Component Value Date/Time   COLORURINE YELLOW 12/21/2020 1316   APPEARANCEUR HAZY (A) 12/21/2020 1316   LABSPEC 1.003 (L) 12/21/2020 1316   PHURINE 8.0 12/21/2020 1316   GLUCOSEU NEGATIVE 12/21/2020 1316   HGBUR NEGATIVE 12/21/2020 1316   BILIRUBINUR NEGATIVE 12/21/2020 1316   KETONESUR NEGATIVE 12/21/2020 1316   PROTEINUR NEGATIVE 12/21/2020 1316   UROBILINOGEN 0.2 08/31/2014 1454   NITRITE NEGATIVE 12/21/2020 1316   LEUKOCYTESUR LARGE (A) 12/21/2020 1316    Radiological Exams on Admission: CT Head Wo Contrast  Result Date: 12/21/2020 CLINICAL DATA:  Shakiness Difficulty walking without assistance for 3-4 days. EXAM: CT HEAD WITHOUT CONTRAST TECHNIQUE: Contiguous axial images were obtained from the base of the skull through the vertex without intravenous contrast. COMPARISON:  09/30/2015 FINDINGS: Brain: No evidence of acute infarction, hemorrhage, hydrocephalus, extra-axial collection or mass lesion/mass effect. Vascular: No hyperdense vessel or unexpected calcification. Skull: Normal. Negative for fracture or focal lesion. Sinuses/Orbits: Partial opacification right mastoid air cells and sphenoid sinuses. Other: None. IMPRESSION: No acute intracranial abnormality. Electronically Signed   By: Acquanetta Belling M.D.   On: 12/21/2020 14:49    EKG: Ordered.   Assessment/Plan Principal Problem:   Hyponatremia Active Problems:   Hypokalemia   Anxiety   Chronic pain   Continuous opioid dependence (HCC)   Clinical depression   Insomnia   Hyponatremia : Patient presented with generalized weakness and shakiness. She is found to have sodium of 123.  Her baseline sodium 140. No cause found so far, suspect dehydration. CT head negative for  any acute abnormality. Patient denies any fall, recent medication change, trauma, URI. Continue normal saline @100  cc/h. Monitor sodium every 6 hours. Obtain serum and urine osmolality.  Hypokalemia : Replacement completed, continue to monitor.  UTI: Patient reports increased urinary frequency and burning. UA shows leukocytes +, nitrite - Continue ceftriaxone, follow-up urine cultures.  Generalized weakness : Patient reports generalized weakness, neurological exam nonfocal. PT and OT evaluation. Continue management for UTI and hyponatremia.  Anxiety /depression : Continue Lexapro, Xanax, Seroquel She denies any suicidal/ homicidal ideations.  Chronic pain syndrome: Continue home hydrocodone  Insomnia : Continue trazodone, Ambien.  Covid test > Pending. Continue airborne isolation.    DVT prophylaxis: Lovenox. Code Status: Full code. Family Communication: No family at bed side. Disposition Plan:  Status is: Inpatient  Remains inpatient appropriate because:Inpatient level of care appropriate due to severity of illness   Dispo: The patient is from: Home              Anticipated d/c is to: Home              Patient currently is not medically stable to d/c.   Difficult to place patient No  Consults called: None Admission status:  Inpatient   Cipriano Bunker MD [facility]   If 7PM-7AM, please contact night-coverage [email or web address]   12/21/2020, 5:19 PM

## 2020-12-22 LAB — COMPREHENSIVE METABOLIC PANEL
ALT: 12 U/L (ref 0–44)
AST: 26 U/L (ref 15–41)
Albumin: 3.9 g/dL (ref 3.5–5.0)
Alkaline Phosphatase: 84 U/L (ref 38–126)
Anion gap: 6 (ref 5–15)
BUN: 8 mg/dL (ref 8–23)
CO2: 23 mmol/L (ref 22–32)
Calcium: 9.3 mg/dL (ref 8.9–10.3)
Chloride: 106 mmol/L (ref 98–111)
Creatinine, Ser: 0.69 mg/dL (ref 0.44–1.00)
GFR, Estimated: >60 mL/min (ref >60)
Glucose, Bld: 97 mg/dL (ref 70–99)
Potassium: 4.4 mmol/L (ref 3.5–5.1)
Sodium: 135 mmol/L (ref 135–145)
Total Bilirubin: 0.4 mg/dL (ref 0.3–1.2)
Total Protein: 7 g/dL (ref 6.5–8.1)

## 2020-12-22 LAB — CBC
HCT: 34.5 % — ABNORMAL LOW (ref 36.0–46.0)
Hemoglobin: 11.3 g/dL — ABNORMAL LOW (ref 12.0–15.0)
MCH: 28.9 pg (ref 26.0–34.0)
MCHC: 32.8 g/dL (ref 30.0–36.0)
MCV: 88.2 fL (ref 80.0–100.0)
Platelets: 188 10*3/uL (ref 150–400)
RBC: 3.91 MIL/uL (ref 3.87–5.11)
RDW: 13.9 % (ref 11.5–15.5)
WBC: 6.1 10*3/uL (ref 4.0–10.5)
nRBC: 0 % (ref 0.0–0.2)

## 2020-12-22 NOTE — Evaluation (Signed)
Physical Therapy Evaluation Patient Details Name: [name] MRN: [id number] DOB: May 01, 1946 Today's Date: 12/22/2020   History of Present Illness  75 y.o. female  presented to the ED with generalized weakness and shakiness for 4 days. Dx of hyponatremia, hypokalemia. Pt  with medical history significant of major depression, chronic anxiety disorder, chronic pain syndrome, migraine.  Clinical Impression  Pt admitted with above diagnosis. Pt ambulated 53' with RW with mild unsteadiness, loss of balance x 1 which pt was able to self correct. Good progress expected as medical issues resolve. Pt currently with functional limitations due to the deficits listed below (see PT Problem List). Pt will benefit from skilled PT to increase their independence and safety with mobility to allow discharge to the venue listed below.       Follow Up Recommendations Home health PT    Equipment Recommendations  None recommended by PT    Recommendations for Other Services       Precautions / Restrictions Precautions Precautions: Fall Precaution Comments: pt denies falls in past 1 year, does report dizziness during PT eval Restrictions Weight Bearing Restrictions: No      Mobility  Bed Mobility Overal bed mobility: Needs Assistance Bed Mobility: Supine to Sit     Supine to sit: Supervision     General bed mobility comments: pt reported some dizziness with supine to sit    Transfers Overall transfer level: Needs assistance Equipment used: Rolling walker (2 wheeled) Transfers: Sit to/from Stand Sit to Stand: Min guard         General transfer comment: VCs hand placement  Ambulation/Gait Ambulation/Gait assistance: Min guard Gait Distance (Feet): 90 Feet Assistive device: Rolling walker (2 wheeled) Gait Pattern/deviations: Step-through pattern;Decreased stride length Gait velocity: decr   General Gait Details: mildly unsteady, LOB x 1 which pt was able to self correct  Stairs             Wheelchair Mobility    Modified Rankin (Stroke Patients Only)       Balance Overall balance assessment: Needs assistance Sitting-balance support: No upper extremity supported;Feet supported Sitting balance-Leahy Scale: Good     Standing balance support: Single extremity supported Standing balance-Leahy Scale: Fair                               Pertinent Vitals/Pain Pain Assessment: No/denies pain    Home Living Family/patient expects to be discharged to:: Private residence Living Arrangements: Spouse/significant other Available Help at Discharge: Family;Available PRN/intermittently Type of Home: House Home Access: Stairs to enter   Entrance Stairs-Number of Steps: 1 Home Layout: One level Home Equipment: Walker - 2 wheels Additional Comments: Husband works 4 days a week.    Prior Function Level of Independence: Independent         Comments: Independent at baseline. Activity fairly sedentary due to chronic back pain.     Hand Dominance   Dominant Hand: Right    Extremity/Trunk Assessment   Upper Extremity Assessment Upper Extremity Assessment: Defer to OT evaluation    Lower Extremity Assessment Lower Extremity Assessment: Overall WFL for tasks assessed    Cervical / Trunk Assessment Cervical / Trunk Assessment: Normal  Communication   Communication: No difficulties  Cognition Arousal/Alertness: Awake/alert Behavior During Therapy: WFL for tasks assessed/performed Overall Cognitive Status: Within Functional Limits for tasks assessed  General Comments      Exercises     Assessment/Plan    PT Assessment Patient needs continued PT services  PT Problem List Decreased activity tolerance;Decreased mobility;Decreased balance       PT Treatment Interventions Gait training;Therapeutic exercise;Balance training;Functional mobility training;Patient/family education     PT Goals (Current goals can be found in the Care Plan section)  Acute Rehab PT Goals Patient Stated Goal: to walk to bathroom independenetly PT Goal Formulation: With patient Time For Goal Achievement: 01/05/21 Potential to Achieve Goals: Good    Frequency Min 3X/week   Barriers to discharge        Co-evaluation               AM-PAC PT "6 Clicks" Mobility  Outcome Measure Help needed turning from your back to your side while in a flat bed without using bedrails?: A Little Help needed moving from lying on your back to sitting on the side of a flat bed without using bedrails?: A Little Help needed moving to and from a bed to a chair (including a wheelchair)?: A Little Help needed standing up from a chair using your arms (e.g., wheelchair or bedside chair)?: A Little Help needed to walk in hospital room?: A Little Help needed climbing 3-5 steps with a railing? : A Little 6 Click Score: 18    End of Session Equipment Utilized During Treatment: Gait belt Activity Tolerance: Patient tolerated treatment well Patient left: in bed;with call bell/phone within reach;with bed alarm set Nurse Communication: Mobility status PT Visit Diagnosis: Difficulty in walking, not elsewhere classified (R26.2)    Time: 6144-3154 PT Time Calculation (min) (ACUTE ONLY): 12 min   Charges:   PT Evaluation $PT Eval Moderate Complexity: 1 Mod         Tamala Ser PT 12/22/2020  Acute Rehabilitation Services Pager [phone] Office [phone]

## 2020-12-22 NOTE — Evaluation (Signed)
Occupational Therapy Evaluation Patient Details Name: [name] MRN: [id number] DOB: [birth year] Today's Date: 12/22/2020    History of Present Illness Patient admitted with hyponatremia. [name] is a 75 y.o. female with medical history significant of major depression, chronic anxiety disorder, chronic pain syndrome, migraine presents in the ED with generalized weakness and shakiness for last 4 days.   Clinical Impression   [name] is a 75 year old woman who presents with generalized weakness, decreased activity tolerance and impaired balance. On evaluation she needs min guard for ADLs and min guard to min assist for ambulation due to unsteadiness. Patient able to perform toileting and stand at the sink and push IV pole in room to ambulate. Patient had one mild LOB that therapist assisted to correct. Patient reports mild dizziness when she moves her head while seated in recliner. Patient will benefit from skilled OT services while in hospital to improve deficits and learn compensatory strategies as needed in order to return home at discharge. Expect patient to improve while in hospital and will not need OT services at discharge. Did discuss use of walker at home and the use of a shower chair for safety. Patient verbalized understanding.    Follow Up Recommendations  No OT follow up    Equipment Recommendations  Tub/shower seat    Recommendations for Other Services       Precautions / Restrictions Precautions Precautions: Fall Restrictions Weight Bearing Restrictions: No      Mobility Bed Mobility Overal bed mobility: Needs Assistance Bed Mobility: Supine to Sit     Supine to sit: Supervision          Transfers Overall transfer level: Needs assistance Equipment used: None Transfers: Sit to/from Stand;Stand Pivot Transfers Sit to Stand: Min assist Stand pivot transfers: Min assist       General transfer comment: OVerall min assist and use of hand  holds to ambulate to bathroom and back. Patient unsteady with one LOB that therapist assisted with correcting. Patient pushing IV pole.    Balance Overall balance assessment: Needs assistance Sitting-balance support: No upper extremity supported Sitting balance-Leahy Scale: Fair     Standing balance support: Single extremity supported Standing balance-Leahy Scale: Fair                             ADL either performed or assessed with clinical judgement   ADL Overall ADL's : Needs assistance/impaired Eating/Feeding: Independent   Grooming: Wash/dry hands;Standing;Min guard   Upper Body Bathing: Independent   Lower Body Bathing: Sit to/from stand;Min guard   Upper Body Dressing : Independent   Lower Body Dressing: Min guard   Toilet Transfer: Min guard;Regular Toilet;Grab bars Toilet Transfer Details (indicate cue type and reason): pushed IV pole, use of grab bars Toileting- Clothing Manipulation and Hygiene: Min guard;Sit to/from stand       Functional mobility during ADLs: Min guard       Vision Baseline Vision/History: Wears glasses Wears Glasses: Reading only       Perception     Praxis      Pertinent Vitals/Pain Pain Assessment: No/denies pain     Hand Dominance Right   Extremity/Trunk Assessment Upper Extremity Assessment Upper Extremity Assessment: Overall WFL for tasks assessed   Lower Extremity Assessment Lower Extremity Assessment: Defer to PT evaluation   Cervical / Trunk Assessment Cervical / Trunk Assessment: Normal   Communication Communication Communication: No difficulties   Cognition  Arousal/Alertness: Awake/alert Behavior During Therapy: WFL for tasks assessed/performed Overall Cognitive Status: Within Functional Limits for tasks assessed                                     General Comments       Exercises     Shoulder Instructions      Home Living Family/patient expects to be discharged to::  Private residence Living Arrangements: Spouse/significant other Available Help at Discharge: Family;Available PRN/intermittently Type of Home: House Home Access: Stairs to enter [employer] of Steps: 1   Home Layout: One level     Bathroom Shower/Tub: Producer, television/film/video: Handicapped height     Home Equipment: Environmental consultant - 2 wheels   Additional Comments: Husband works 4 days a week.      Prior Functioning/Environment Level of Independence: Independent        Comments: Independent at baseline. Activity fairly sedentary due to chronic back pain.        OT Problem List: Impaired balance (sitting and/or standing);Decreased cognition;Decreased knowledge of use of DME or AE      OT Treatment/Interventions: Self-care/ADL training;DME and/or AE instruction;Therapeutic activities;Balance training;Patient/family education;Therapeutic exercise    OT Goals(Current goals can be found in the care plan section) Acute Rehab OT Goals Patient Stated Goal: to walk to bathroom independenetly OT Goal Formulation: With patient Time For Goal Achievement: 01/05/21 Potential to Achieve Goals: Good  OT Frequency: Min 2X/week   Barriers to D/C:            Co-evaluation              AM-PAC OT "6 Clicks" Daily Activity     Outcome Measure Help from another person eating meals?: None Help from another person taking care of personal grooming?: A Little Help from another person toileting, which includes using toliet, bedpan, or urinal?: A Little Help from another person bathing (including washing, rinsing, drying)?: A Little Help from another person to put on and taking off regular upper body clothing?: A Little Help from another person to put on and taking off regular lower body clothing?: A Little 6 Click Score: 19   End of Session Equipment Utilized During Treatment: Gait belt Nurse Communication:  (okay to see)  Activity Tolerance: Patient tolerated  treatment well Patient left: in chair;with call bell/phone within reach;with chair alarm set  OT Visit Diagnosis: Unsteadiness on feet (R26.81);Muscle weakness (generalized) (M62.81)                Time: 6237-6283 OT Time Calculation (min): 26 min Charges:  OT General Charges $OT Visit: 1 Visit OT Evaluation $OT Eval Low Complexity: 1 Low OT Treatments $Self Care/Home Management : 8-22 mins  Giani Betzold, OTR/L Acute Care Rehab Services  Office [phone] Pager: [phone]   Kelli Churn 12/22/2020, 9:36 AM

## 2020-12-22 NOTE — Progress Notes (Signed)
PROGRESS NOTE    [name]  IRC:[id number] DOB: 1946-01-27 DOA: 12/21/2020 PCP: Laqueta Due., MD   Brief Narrative:  This 75 years old female with PMH significant for major depression, chronic anxiety disorder, chronic pain syndrome, migraine presents in the ED with generalized weakness and shakiness for last few days.  She is found to have hyponatremia, hypokalemia and UTI.  She is on IV antibiotics,  sodium and potassium has improved.  Urine culture grows Klebsiella pneumonia, sensitivity pending.  Assessment & Plan:   Principal Problem:   Hyponatremia Active Problems:   Hypokalemia   Anxiety   Chronic pain   Continuous opioid dependence (HCC)   Clinical depression   Insomnia  Hyponatremia : > Resolved. Patient presented with generalized weakness and shakiness. She is found to have sodium of 123.  Her baseline sodium 140. No cause found so far, suspect dehydration. CT head negative for any acute abnormality. Patient denies any fall, recent medication change, trauma, URI. Continue normal saline @100  cc/h. discontinue IV hydration. Serum sodium has improved.    Hypokalemia : >Resolved Replacement completed, continue to monitor.   UTI: Patient reports increased urinary frequency and burning. UA shows leukocytes +, nitrite - Continue ceftriaxone, f Urine culture grew Klebsiella pneumonia sensitivity pending.   Generalized weakness : Patient reports generalized weakness, neurological exam nonfocal. PT and OT recommended home with home services. Continue management for UTI and hyponatremia.   Anxiety /depression : Continue Lexapro, Xanax, Seroquel She denies any suicidal/ homicidal ideations.   Chronic pain syndrome: Continue home hydrocodone   Insomnia : Continue trazodone, Ambien.   Covid test > negative.  discontinue airborne isolation.      DVT prophylaxis:  Lovenox. Code Status:  Full code. Family Communication: No family at bed side. Disposition  Plan:    Status is: Inpatient  Remains inpatient appropriate because:Inpatient level of care appropriate due to severity of illness  Dispo: The patient is from: Home              Anticipated d/c is to: Home              Patient currently is not medically stable to d/c.   Difficult to place patient No         Consultants:  None  Procedures:None  Antimicrobials:   Anti-infectives (From admission, onward)    Start     Dose/Rate Route Frequency Ordered Stop   12/21/20 1730  cefTRIAXone (ROCEPHIN) 1 g in sodium chloride 0.9 % 100 mL IVPB        1 g 200 mL/hr over 30 Minutes Intravenous Every 24 hours 12/21/20 1717     12/21/20 1615  cephALEXin (KEFLEX) capsule 250 mg  Status:  Discontinued        250 mg Oral  Once 12/21/20 1600 12/21/20 1721       Subjective: Patient was seen and examined at bedside.  Overnight events noted.  Patient reports she is much better still has some shakiness but feels much stronger.  Denies any dizziness and shortness of breath.  Objective: Vitals:   12/21/20 2011 12/22/20 0447 12/22/20 0825 12/22/20 1241  BP: 139/60 132/78 (!) 141/82 139/68  Pulse: 82 78 79 92  Resp: 18 18 18 18   Temp: 98.2 F (36.8 C) 98 F (36.7 C) 98.3 F (36.8 C) 98.2 F (36.8 C)  TempSrc: Oral Oral Oral Oral  SpO2: 97% 96% 99% 100%    Intake/Output Summary (Last 24 hours) at 12/22/2020 1526 Last data  filed at 12/22/2020 1055 Gross per 24 hour  Intake 2508.07 ml  Output 3525 ml  Net -1016.93 ml   There were no vitals filed for this visit.  Examination:  General exam: Appears calm and comfortable , not in any acute distress. Respiratory system: Clear to auscultation. Respiratory effort normal. Cardiovascular system: S1 & S2 heard, RRR. No JVD, murmurs, rubs, gallops or clicks. No pedal edema. Gastrointestinal system: Abdomen is nondistended, soft and nontender. No organomegaly or masses felt. Normal bowel sounds heard. Central nervous system: Alert and  oriented. No focal neurological deficits. Extremities: Symmetric 5 x 5 power.  No edema, no cyanosis, no clubbing. Skin: No rashes, lesions or ulcers Psychiatry: Judgement and insight appear normal. Mood & affect appropriate.     Data Reviewed: I have personally reviewed following labs and imaging studies  CBC: Recent Labs  Lab 12/21/20 1240 12/22/20 0031  WBC 7.0 6.1  NEUTROABS 5.2  --   HGB 10.8* 11.3*  HCT 31.8* 34.5*  MCV 85.9 88.2  PLT 170 188   Basic Metabolic Panel: Recent Labs  Lab 12/21/20 1240 12/21/20 1917 12/22/20 0031  NA 123* 131* 135  K 3.2* 3.8 4.4  CL 86* 96* 106  CO2 28 26 23   GLUCOSE 121* 95 97  BUN 11 10 8   CREATININE 0.78 0.72 0.69  CALCIUM 9.0 9.5 9.3  MG 2.2  --   --    GFR: CrCl cannot be calculated (Unknown ideal weight.). Liver Function Tests: Recent Labs  Lab 12/22/20 0031  AST 26  ALT 12  ALKPHOS 84  BILITOT 0.4  PROT 7.0  ALBUMIN 3.9   No results for input(s): LIPASE, AMYLASE in the last 168 hours. No results for input(s): AMMONIA in the last 168 hours. Coagulation Profile: No results for input(s): INR, PROTIME in the last 168 hours. Cardiac Enzymes: No results for input(s): CKTOTAL, CKMB, CKMBINDEX, TROPONINI in the last 168 hours. BNP (last 3 results) No results for input(s): PROBNP in the last 8760 hours. HbA1C: No results for input(s): HGBA1C in the last 72 hours. CBG: No results for input(s): GLUCAP in the last 168 hours. Lipid Profile: No results for input(s): CHOL, HDL, LDLCALC, TRIG, CHOLHDL, LDLDIRECT in the last 72 hours. Thyroid Function Tests: No results for input(s): TSH, T4TOTAL, FREET4, T3FREE, THYROIDAB in the last 72 hours. Anemia Panel: No results for input(s): VITAMINB12, FOLATE, FERRITIN, TIBC, IRON, RETICCTPCT in the last 72 hours. Sepsis Labs: No results for input(s): PROCALCITON, LATICACIDVEN in the last 168 hours.  Recent Results (from the past 240 hour(s))  Urine culture     Status: Abnormal  (Preliminary result)   Collection Time: 12/21/20  1:16 PM   Specimen: Urine, Random  Result Value Ref Range Status   Specimen Description   Final    URINE, RANDOM Performed at [facility], [address] [place], [place]    Special Requests   Final    NONE Performed at [facility], [address] [place], [place]    Culture >=100,000 COLONIES/mL KLEBSIELLA PNEUMONIAE (A)  Final   Report Status PENDING  Incomplete  Resp Panel by RT-PCR (Flu A&B, Covid) Nasopharyngeal Swab     Status: None   Collection Time: 12/21/20  5:45 PM   Specimen: Nasopharyngeal Swab; Nasopharyngeal(NP) swabs in vial transport medium  Result Value Ref Range Status   SARS Coronavirus 2 by RT PCR NEGATIVE NEGATIVE Final    Comment: (NOTE) SARS-CoV-2 target nucleic acids are NOT  DETECTED.  The SARS-CoV-2 RNA is generally detectable in upper respiratory specimens during the acute phase of infection. The lowest concentration of SARS-CoV-2 viral copies this assay can detect is 138 copies/mL. A negative result does not preclude SARS-Cov-2 infection and should not be used as the sole basis for treatment or other patient management decisions. A negative result may occur with  improper specimen collection/handling, submission of specimen other than nasopharyngeal swab, presence of viral mutation(s) within the areas targeted by this assay, and inadequate number of viral copies(<138 copies/mL). A negative result must be combined with clinical observations, patient history, and epidemiological information. The expected result is Negative.  Fact Sheet for Patients:  BloggerCourse.com  Fact Sheet for Healthcare Providers:  SeriousBroker.it  This test is no t yet approved or cleared by the [place] FDA and  has been authorized for detection and/or diagnosis of SARS-CoV-2 by FDA under an Emergency Use  Authorization (EUA). This EUA will remain  in effect (meaning this test can be used) for the duration of the COVID-19 declaration under Section 564(b)(1) of the Act, 21 U.S.C.section 360bbb-3(b)(1), unless the authorization is terminated  or revoked sooner.       Influenza A by PCR NEGATIVE NEGATIVE Final   Influenza B by PCR NEGATIVE NEGATIVE Final    Comment: (NOTE) The Xpert Xpress SARS-CoV-2/FLU/RSV plus assay is intended as an aid in the diagnosis of influenza from Nasopharyngeal swab specimens and should not be used as a sole basis for treatment. Nasal washings and aspirates are unacceptable for Xpert Xpress SARS-CoV-2/FLU/RSV testing.  Fact Sheet for Patients: BloggerCourse.com  Fact Sheet for Healthcare Providers: SeriousBroker.it  This test is not yet approved or cleared by the [place] FDA and has been authorized for detection and/or diagnosis of SARS-CoV-2 by FDA under an Emergency Use Authorization (EUA). This EUA will remain in effect (meaning this test can be used) for the duration of the COVID-19 declaration under Section 564(b)(1) of the Act, 21 U.S.C. section 360bbb-3(b)(1), unless the authorization is terminated or revoked.  Performed at [facility], [address], [place], [place] [address]     Radiology Studies: CT Head Wo Contrast  Result Date: 12/21/2020 CLINICAL DATA:  Shakiness Difficulty walking without assistance for 3-4 days. EXAM: CT HEAD WITHOUT CONTRAST TECHNIQUE: Contiguous axial images were obtained from the base of the skull through the vertex without intravenous contrast. COMPARISON:  09/30/2015 FINDINGS: Brain: No evidence of acute infarction, hemorrhage, hydrocephalus, extra-axial collection or mass lesion/mass effect. Vascular: No hyperdense vessel or unexpected calcification. Skull: Normal. Negative for fracture or focal lesion. Sinuses/Orbits: Partial opacification  right mastoid air cells and sphenoid sinuses. Other: None. IMPRESSION: No acute intracranial abnormality. Electronically Signed   By: Acquanetta Belling M.D.   On: 12/21/2020 14:49     Scheduled Meds:  ALPRAZolam  1 mg Oral QHS   enoxaparin (LOVENOX) injection  40 mg Subcutaneous Q24H   escitalopram  20 mg Oral QHS   pantoprazole  40 mg Oral Daily   potassium chloride  40 mEq Oral Once   senna-docusate  2 tablet Oral QHS   traZODone  200 mg Oral QHS   zolpidem  2.5 mg Oral QHS   Continuous Infusions:  sodium chloride 125 mL/hr at 12/21/20 2030   cefTRIAXone (ROCEPHIN)  IV Stopped (12/22/20 0308)     LOS: 1 day    Time spent:25 mins    Cipriano Bunker, MD [facility]   If 7PM-7AM, please contact night-coverage

## 2020-12-23 LAB — BASIC METABOLIC PANEL
Anion gap: 9 (ref 5–15)
BUN: 6 mg/dL — ABNORMAL LOW (ref 8–23)
CO2: 21 mmol/L — ABNORMAL LOW (ref 22–32)
Calcium: 9.2 mg/dL (ref 8.9–10.3)
Chloride: 106 mmol/L (ref 98–111)
Creatinine, Ser: 0.69 mg/dL (ref 0.44–1.00)
GFR, Estimated: >60 mL/min (ref >60)
Glucose, Bld: 93 mg/dL (ref 70–99)
Potassium: 3.9 mmol/L (ref 3.5–5.1)
Sodium: 136 mmol/L (ref 135–145)

## 2020-12-23 LAB — URINE CULTURE: Culture: >=100000 — AB

## 2020-12-23 MED ORDER — CEPHALEXIN 500 MG PO CAPS: 500.0000 mg | ORAL_CAPSULE | Freq: Two times a day (BID) | ORAL | 0 refills | Status: AC

## 2020-12-23 MED ORDER — CEPHALEXIN 500 MG PO CAPS: 500.0000 mg | ORAL_CAPSULE | Freq: Two times a day (BID) | ORAL | 0 refills | Status: DC

## 2020-12-23 NOTE — Discharge Instructions (Signed)
Advised to follow-up with primary care physician in 1 week. Advised to take Keflex 500 mg twice daily for 2 more days to complete 5-day treatment for uncomplicated UTI.

## 2020-12-23 NOTE — Progress Notes (Signed)
Occupational Therapy Treatment Patient Details Name: [name] MRN: [id number] DOB: 1946-04-14 Today's Date: 12/23/2020    History of present illness 75 y.o. female  presented to the ED with generalized weakness and shakiness for 4 days. Dx of hyponatremia, hypokalemia. Pt  with medical history significant of major depression, chronic anxiety disorder, chronic pain syndrome, migraine.   OT comments  Patient reports already performed grooming/hygiene tasks and used bathroom this AM. Agreeable to functional ambulation to maximize activity tolerance in order to participate in self care routine at home. Patient min G assist without adaptive device, appears somewhat anxious "I can't walk by myself." Patient ambulate ~20 ft with no loss of balance then asking to turn around and supervision to transfer into recliner. Patient reports hopefully D/C home today with spouse.     Follow Up Recommendations  No OT follow up    Equipment Recommendations  Tub/shower seat       Precautions / Restrictions Precautions Precautions: Fall Precaution Comments: patient denies falls in past year       Mobility Bed Mobility Overal bed mobility: Modified Independent                  Transfers Overall transfer level: Needs assistance Equipment used: None Transfers: Sit to/from Stand Sit to Stand: Min guard         General transfer comment: for safety as patient is mildly tremulous    Balance Overall balance assessment: Needs assistance Sitting-balance support: No upper extremity supported;Feet supported Sitting balance-Leahy Scale: Good     Standing balance support: No upper extremity supported Standing balance-Leahy Scale: Fair                             ADL either performed or assessed with clinical judgement   ADL Overall ADL's : Needs assistance/impaired                         Toilet Transfer: Min guard;Ambulation Toilet Transfer Details (indicate  cue type and reason): min G as patient is mildly tremulous and patient stating "I can't walk by myself"         Functional mobility during ADLs: Min guard General ADL Comments: encourage patient to participate in functional ambulation as she is likely D/C home today with spouse, patient self limiting wanting to turn around after ~20 ft      Cognition Arousal/Alertness: Awake/alert Behavior During Therapy: WFL for tasks assessed/performed Overall Cognitive Status: No family/caregiver present to determine baseline cognitive functioning                                 General Comments: patient asking how she will get back to bed without OT there, instructed to call nursing staff, also appears a little self limiting wanting to turn around walking after ~22ft appears a little anxious                   Pertinent Vitals/ Pain       Pain Assessment: No/denies pain         Frequency  Min 2X/week        Progress Toward Goals  OT Goals(current goals can now be found in the care plan section)  Progress towards OT goals: Progressing toward goals  Acute Rehab OT Goals Patient Stated Goal: to walk to bathroom independenetly OT Goal  Formulation: With patient Time For Goal Achievement: 01/05/21 Potential to Achieve Goals: Good ADL Goals Pt Will Perform Lower Body Bathing: Independently Pt Will Perform Lower Body Dressing: with modified independence;sit to/from stand Pt Will Transfer to Toilet: with modified independence Pt Will Perform Toileting - Clothing Manipulation and hygiene: with modified independence;sit to/from stand Additional ADL Goal #1: Patient will stand at sink x 3 minutes with supervision only to perform grooming task as evidence of improving activity tolerance  and balance  Plan Discharge plan remains appropriate       AM-PAC OT "6 Clicks" Daily Activity     Outcome Measure   Help from another person eating meals?: None Help from another  person taking care of personal grooming?: A Little Help from another person toileting, which includes using toliet, bedpan, or urinal?: A Little Help from another person bathing (including washing, rinsing, drying)?: A Little Help from another person to put on and taking off regular upper body clothing?: A Little Help from another person to put on and taking off regular lower body clothing?: A Little 6 Click Score: 19    End of Session    OT Visit Diagnosis: Unsteadiness on feet (R26.81);Muscle weakness (generalized) (M62.81)   Activity Tolerance Patient tolerated treatment well   Patient Left in chair;with call bell/phone within reach;with chair alarm set   Nurse Communication Mobility status        Time: 0100-7121 OT Time Calculation (min): 14 min  Charges: OT General Charges $OT Visit: 1 Visit OT Treatments $Self Care/Home Management : 8-22 mins  Marlyce Huge OT OT pager: [phone]   Carmelia Roller 12/23/2020, 12:36 PM

## 2020-12-23 NOTE — Discharge Summary (Addendum)
Physician Discharge Summary  BRIANY AYE DDU:[id number] DOB: 07-20-1945 DOA: 12/21/2020  PCP: Laqueta Due., MD  Admit date: 12/21/2020  Discharge date: 12/23/2020  Admitted From: Home.  Disposition:  Home health services.  Recommendations for Outpatient Follow-up:  Follow up with PCP in 1-2 weeks. Please obtain BMP/CBC in one week. Advised to take Keflex 500 mg twice daily for 2 more days to complete 5-day treatment for uncomplicated UTI.  Home Health:Yes Home PT. Equipment/Devices:None  Discharge Condition: Stable CODE STATUS:Full code Diet recommendation: Heart Healthy   Brief Summary / Hospital course: This 75 years old female with PMH significant for major depression, chronic anxiety disorder, chronic pain syndrome, migraine presents in the ED with generalized weakness and shakiness for last few days.  She denies any fall, trauma, injury, any new medications.  She is found to have hyponatremia ( Na 123 ) , hypokalemia ( K 2.3)  and UTI.  She was started on IV antibiotics, IV hydration.  Sodium and potassium has improved.  Urine culture grew Klebsiella pneumonia, sensitive to ceftriaxone.  Patient was managed with IV fluids and IV antibiotics and she has significantly improved.  Physical therapy evaluation completed , recommended home with home physical therapy.  Patient feels improved and feeling better,  sodium has improved.  Patient wants to be discharged.  Patient is being discharged home on Keflex 500 mg twice daily for 2 more days to complete treatment for uncomplicated UTI.  She was managed for below problems during hospitalization.   Discharge Diagnoses:  Principal Problem:   Hyponatremia Active Problems:   Hypokalemia   Anxiety   Chronic pain   Continuous opioid dependence (HCC)   Clinical depression   Insomnia  Hyponatremia : > Resolved. Patient presented with generalized weakness and shakiness. She is found to have sodium of 123.  Her baseline sodium 140. No  cause found so far, suspect dehydration. CT head negative for any acute abnormality. Patient denies any fall, recent medication change, trauma, URI. Continue normal saline @100  cc/h. discontinue IV hydration. Serum sodium has improved.     Hypokalemia : >Resolved Replacement completed, continue to monitor.   UTI: Patient reports increased urinary frequency and burning. UA shows leukocytes +, nitrite - Continue ceftriaxone, f Urine culture grew Klebsiella pneumonia  She is being discharged home on Keflex 500 mg twice daily for next 2 days to complete 5-day treatment for uncomplicated UTI.   Generalized weakness : Patient reports generalized weakness, neurological exam nonfocal. PT and OT recommended home with home services. Continue management for UTI and hyponatremia.   Anxiety /depression : Continue Lexapro, Xanax, Seroquel She denies any suicidal/ homicidal ideations.   Chronic pain syndrome: Continue home hydrocodone   Insomnia : Continue trazodone, Ambien.   Covid test > negative.  discontinue airborne isolation.    Discharge Instructions  Discharge Instructions     Call MD for:  difficulty breathing, headache or visual disturbances   Complete by: As directed    Call MD for:  persistant dizziness or light-headedness   Complete by: As directed    Call MD for:  persistant nausea and vomiting   Complete by: As directed    Diet - low sodium heart healthy   Complete by: As directed    Diet Carb Modified   Complete by: As directed    Discharge instructions   Complete by: As directed    Advised to follow-up with primary care physician in 1 week. Advised to take Keflex 500 mg twice daily for  2 more days to complete 5-day treatment for uncomplicated UTI.   Increase activity slowly   Complete by: As directed       Allergies as of 12/23/2020       Reactions   Sulfa Antibiotics Hives        Medication List     STOP taking these medications     guaiFENesin-dextromethorphan 100-10 MG/5ML syrup Commonly known as: ROBITUSSIN DM   ondansetron 4 MG disintegrating tablet Commonly known as: Zofran ODT       TAKE these medications    alendronate 70 MG tablet Commonly known as: FOSAMAX Take 70 mg by mouth once a week. Sunday   ALPRAZolam 1 MG tablet Commonly known as: XANAX Take 1 mg by mouth at bedtime.   cephALEXin 500 MG capsule Commonly known as: KEFLEX Take 1 capsule (500 mg total) by mouth 2 (two) times daily for 2 days.   ergocalciferol 1.25 MG (50000 UT) capsule Commonly known as: VITAMIN D2 Take 1 capsule by mouth once a week.   escitalopram 20 MG tablet Commonly known as: LEXAPRO Take 20 mg by mouth at bedtime.   HYDROcodone-acetaminophen 10-325 MG tablet Commonly known as: NORCO Take 1 tablet by mouth 3 (three) times daily as needed for pain.   omeprazole 20 MG capsule Commonly known as: PRILOSEC Take 20 mg by mouth 2 (two) times daily before a meal.   QUEtiapine 300 MG tablet Commonly known as: SEROQUEL Take 600 mg by mouth at bedtime.   SALINE MIST SPRAY NA Place 1 spray into the nose daily at 6 (six) AM.   sennosides-docusate sodium 8.6-50 MG tablet Commonly known as: SENOKOT-S Take 2 tablets by mouth at bedtime.   traZODone 100 MG tablet Commonly known as: DESYREL Take 200 mg by mouth at bedtime.   zolpidem 5 MG tablet Commonly known as: AMBIEN Take 2.5 mg by mouth at bedtime.        Follow-up Information     [facility] Follow up.   Specialty: Home Health Services Why: This is the home health agency that will be providing physical therapy in the home Contact information: [address] [place] [address] [phone]         Roger Kill, PA-C. Go on 12/28/2020.   Specialty: Physician Assistant Why: Wednesday at 3:50 for your hospital follow up appointment.  Call to reschedule if this is inconvenient for you. Contact information: [address] [place] [address] [phone]                Allergies  Allergen Reactions   Sulfa Antibiotics Hives    Consultations: None   Procedures/Studies: CT Head Wo Contrast  Result Date: 12/21/2020 CLINICAL DATA:  Shakiness Difficulty walking without assistance for 3-4 days. EXAM: CT HEAD WITHOUT CONTRAST TECHNIQUE: Contiguous axial images were obtained from the base of the skull through the vertex without intravenous contrast. COMPARISON:  09/30/2015 FINDINGS: Brain: No evidence of acute infarction, hemorrhage, hydrocephalus, extra-axial collection or mass lesion/mass effect. Vascular: No hyperdense vessel or unexpected calcification. Skull: Normal. Negative for fracture or focal lesion. Sinuses/Orbits: Partial opacification right mastoid air cells and sphenoid sinuses. Other: None. IMPRESSION: No acute intracranial abnormality. Electronically Signed   By: Acquanetta Belling M.D.   On: 12/21/2020 14:49      Subjective: Patient was seen and examined at bedside.  Overnight events noted.  Patient reports feeling much improved.   She has ambulated with support.  She wants  to be discharged home.  home health services been arranged.  Discharge Exam: Vitals:   12/22/20 2012 12/23/20 0335  BP: (!) 144/86 (!) 160/83  Pulse: 79 84  Resp: 18 15  Temp: 98.4 F (36.9 C) 98.6 F (37 C)  SpO2: 99% 97%   Vitals:   12/22/20 0825 12/22/20 1241 12/22/20 2012 12/23/20 0335  BP: (!) 141/82 139/68 (!) 144/86 (!) 160/83  Pulse: 79 92 79 84  Resp: 18 18 18 15   Temp: 98.3 F (36.8 C) 98.2 F (36.8 C) 98.4 F (36.9 C) 98.6 F (37 C)  TempSrc: Oral Oral Oral Oral  SpO2: 99% 100% 99% 97%    General: Pt is alert, awake, not in acute distress Cardiovascular: RRR, S1/S2 +, no rubs, no gallops Respiratory: CTA bilaterally, no wheezing, no rhonchi Abdominal: Soft, NT, ND, bowel sounds + Extremities: no edema, no cyanosis    The results of significant diagnostics from this  hospitalization (including imaging, microbiology, ancillary and laboratory) are listed below for reference.     Microbiology: Recent Results (from the past 240 hour(s))  Urine culture     Status: Abnormal   Collection Time: 12/21/20  1:16 PM   Specimen: Urine, Random  Result Value Ref Range Status   Specimen Description   Final    URINE, RANDOM Performed at [facility], [address] [place], [place]    Special Requests   Final    NONE Performed at [facility], [address] [place], [place]    Culture >=100,000 COLONIES/mL KLEBSIELLA PNEUMONIAE (A)  Final   Report Status 12/23/2020 FINAL  Final   Organism ID, Bacteria KLEBSIELLA PNEUMONIAE (A)  Final      Susceptibility   Klebsiella pneumoniae - MIC*    AMPICILLIN >=32 RESISTANT Resistant     CEFAZOLIN <=4 SENSITIVE Sensitive     CEFEPIME <=0.12 SENSITIVE Sensitive     CEFTRIAXONE <=0.25 SENSITIVE Sensitive     CIPROFLOXACIN <=0.25 SENSITIVE Sensitive     GENTAMICIN <=1 SENSITIVE Sensitive     IMIPENEM 0.5 SENSITIVE Sensitive     NITROFURANTOIN 64 INTERMEDIATE Intermediate     TRIMETH/SULFA <=20 SENSITIVE Sensitive     AMPICILLIN/SULBACTAM >=32 RESISTANT Resistant     PIP/TAZO <=4 SENSITIVE Sensitive     * >=100,000 COLONIES/mL KLEBSIELLA PNEUMONIAE  Resp Panel by RT-PCR (Flu A&B, Covid) Nasopharyngeal Swab     Status: None   Collection Time: 12/21/20  5:45 PM   Specimen: Nasopharyngeal Swab; Nasopharyngeal(NP) swabs in vial transport medium  Result Value Ref Range Status   SARS Coronavirus 2 by RT PCR NEGATIVE NEGATIVE Final    Comment: (NOTE) SARS-CoV-2 target nucleic acids are NOT DETECTED.  The SARS-CoV-2 RNA is generally detectable in upper respiratory specimens during the acute phase of infection. The lowest concentration of SARS-CoV-2 viral copies this assay can detect is 138 copies/mL. A negative result does not preclude SARS-Cov-2 infection and  should not be used as the sole basis for treatment or other patient management decisions. A negative result may occur with  improper specimen collection/handling, submission of specimen other than nasopharyngeal swab, presence of viral mutation(s) within the areas targeted by this assay, and inadequate number of viral copies(<138 copies/mL). A negative result must be combined with clinical observations, patient history, and epidemiological information. The expected result is Negative.  Fact Sheet for Patients:  02/20/21  Fact Sheet for Healthcare Providers:  BloggerCourse.com  This test is no t yet approved or cleared  by the [place] and  has been authorized for detection and/or diagnosis of SARS-CoV-2 by FDA under an Emergency Use Authorization (EUA). This EUA will remain  in effect (meaning this test can be used) for the duration of the COVID-19 declaration under Section 564(b)(1) of the Act, 21 U.S.C.section 360bbb-3(b)(1), unless the authorization is terminated  or revoked sooner.       Influenza A by PCR NEGATIVE NEGATIVE Final   Influenza B by PCR NEGATIVE NEGATIVE Final    Comment: (NOTE) The Xpert Xpress SARS-CoV-2/FLU/RSV plus assay is intended as an aid in the diagnosis of influenza from Nasopharyngeal swab specimens and should not be used as a sole basis for treatment. Nasal washings and aspirates are unacceptable for Xpert Xpress SARS-CoV-2/FLU/RSV testing.  Fact Sheet for Patients: BloggerCourse.com  Fact Sheet for Healthcare Providers: SeriousBroker.it  This test is not yet approved or cleared by the [place] FDA and has been authorized for detection and/or diagnosis of SARS-CoV-2 by FDA under an Emergency Use Authorization (EUA). This EUA will remain in effect (meaning this test can be used) for the duration of the COVID-19 declaration  under Section 564(b)(1) of the Act, 21 U.S.C. section 360bbb-3(b)(1), unless the authorization is terminated or revoked.  Performed at [facility], [address] [place], [place] [address]      Labs: BNP (last 3 results) Recent Labs    09/26/20 1110 09/27/20 0257  BNP 155.5* 325.6*   Basic Metabolic Panel: Recent Labs  Lab 12/21/20 1240 12/21/20 1917 12/22/20 0031 12/23/20 0524  NA 123* 131* 135 136  K 3.2* 3.8 4.4 3.9  CL 86* 96* 106 106  CO2 28 26 23  21*  GLUCOSE 121* 95 97 93  BUN 11 10 8  6*  CREATININE 0.78 0.72 0.69 0.69  CALCIUM 9.0 9.5 9.3 9.2  MG 2.2  --   --   --    Liver Function Tests: Recent Labs  Lab 12/22/20 0031  AST 26  ALT 12  ALKPHOS 84  BILITOT 0.4  PROT 7.0  ALBUMIN 3.9   No results for input(s): LIPASE, AMYLASE in the last 168 hours. No results for input(s): AMMONIA in the last 168 hours. CBC: Recent Labs  Lab 12/21/20 1240 12/22/20 0031  WBC 7.0 6.1  NEUTROABS 5.2  --   HGB 10.8* 11.3*  HCT 31.8* 34.5*  MCV 85.9 88.2  PLT 170 188   Cardiac Enzymes: No results for input(s): CKTOTAL, CKMB, CKMBINDEX, TROPONINI in the last 168 hours. BNP: Invalid input(s): POCBNP CBG: No results for input(s): GLUCAP in the last 168 hours. D-Dimer No results for input(s): DDIMER in the last 72 hours. Hgb A1c No results for input(s): HGBA1C in the last 72 hours. Lipid Profile No results for input(s): CHOL, HDL, LDLCALC, TRIG, CHOLHDL, LDLDIRECT in the last 72 hours. Thyroid function studies No results for input(s): TSH, T4TOTAL, T3FREE, THYROIDAB in the last 72 hours.  Invalid input(s): FREET3 Anemia work up No results for input(s): VITAMINB12, FOLATE, FERRITIN, TIBC, IRON, RETICCTPCT in the last 72 hours. Urinalysis    Component Value Date/Time   COLORURINE YELLOW 12/21/2020 1316   APPEARANCEUR HAZY (A) 12/21/2020 1316   LABSPEC 1.003 (L) 12/21/2020 1316   PHURINE 8.0 12/21/2020 1316   GLUCOSEU NEGATIVE  12/21/2020 1316   HGBUR NEGATIVE 12/21/2020 1316   BILIRUBINUR NEGATIVE 12/21/2020 1316   KETONESUR NEGATIVE 12/21/2020 1316   PROTEINUR NEGATIVE 12/21/2020 1316   UROBILINOGEN 0.2 08/31/2014 1454   NITRITE NEGATIVE 12/21/2020 1316  LEUKOCYTESUR LARGE (A) 12/21/2020 1316   Sepsis Labs Invalid input(s): PROCALCITONIN,  WBC,  LACTICIDVEN Microbiology Recent Results (from the past 240 hour(s))  Urine culture     Status: Abnormal   Collection Time: 12/21/20  1:16 PM   Specimen: Urine, Random  Result Value Ref Range Status   Specimen Description   Final    URINE, RANDOM Performed at [facility], [address], [place], [place] [address]    Special Requests   Final    NONE Performed at [facility], [address], [place], [place] [address]    Culture >=100,000 COLONIES/mL KLEBSIELLA PNEUMONIAE (A)  Final   Report Status 12/23/2020 FINAL  Final   Organism ID, Bacteria KLEBSIELLA PNEUMONIAE (A)  Final      Susceptibility   Klebsiella pneumoniae - MIC*    AMPICILLIN >=32 RESISTANT Resistant     CEFAZOLIN <=4 SENSITIVE Sensitive     CEFEPIME <=0.12 SENSITIVE Sensitive     CEFTRIAXONE <=0.25 SENSITIVE Sensitive     CIPROFLOXACIN <=0.25 SENSITIVE Sensitive     GENTAMICIN <=1 SENSITIVE Sensitive     IMIPENEM 0.5 SENSITIVE Sensitive     NITROFURANTOIN 64 INTERMEDIATE Intermediate     TRIMETH/SULFA <=20 SENSITIVE Sensitive     AMPICILLIN/SULBACTAM >=32 RESISTANT Resistant     PIP/TAZO <=4 SENSITIVE Sensitive     * >=100,000 COLONIES/mL KLEBSIELLA PNEUMONIAE  Resp Panel by RT-PCR (Flu A&B, Covid) Nasopharyngeal Swab     Status: None   Collection Time: 12/21/20  5:45 PM   Specimen: Nasopharyngeal Swab; Nasopharyngeal(NP) swabs in vial transport medium  Result Value Ref Range Status   SARS Coronavirus 2 by RT PCR NEGATIVE NEGATIVE Final    Comment: (NOTE) SARS-CoV-2 target nucleic acids are NOT DETECTED.  The SARS-CoV-2 RNA is generally  detectable in upper respiratory specimens during the acute phase of infection. The lowest concentration of SARS-CoV-2 viral copies this assay can detect is 138 copies/mL. A negative result does not preclude SARS-Cov-2 infection and should not be used as the sole basis for treatment or other patient management decisions. A negative result may occur with  improper specimen collection/handling, submission of specimen other than nasopharyngeal swab, presence of viral mutation(s) within the areas targeted by this assay, and inadequate number of viral copies(<138 copies/mL). A negative result must be combined with clinical observations, patient history, and epidemiological information. The expected result is Negative.  Fact Sheet for Patients:  BloggerCourse.com  Fact Sheet for Healthcare Providers:  SeriousBroker.it  This test is no t yet approved or cleared by the [place] FDA and  has been authorized for detection and/or diagnosis of SARS-CoV-2 by FDA under an Emergency Use Authorization (EUA). This EUA will remain  in effect (meaning this test can be used) for the duration of the COVID-19 declaration under Section 564(b)(1) of the Act, 21 U.S.C.section 360bbb-3(b)(1), unless the authorization is terminated  or revoked sooner.       Influenza A by PCR NEGATIVE NEGATIVE Final   Influenza B by PCR NEGATIVE NEGATIVE Final    Comment: (NOTE) The Xpert Xpress SARS-CoV-2/FLU/RSV plus assay is intended as an aid in the diagnosis of influenza from Nasopharyngeal swab specimens and should not be used as a sole basis for treatment. Nasal washings and aspirates are unacceptable for Xpert Xpress SARS-CoV-2/FLU/RSV testing.  Fact Sheet for Patients: BloggerCourse.com  Fact Sheet for Healthcare Providers: SeriousBroker.it  This test is not yet approved or cleared by the [place] FDA  and has been  authorized for detection and/or diagnosis of SARS-CoV-2 by FDA under an Emergency Use Authorization (EUA). This EUA will remain in effect (meaning this test can be used) for the duration of the COVID-19 declaration under Section 564(b)(1) of the Act, 21 U.S.C. section 360bbb-3(b)(1), unless the authorization is terminated or revoked.  Performed at [facility], [address] [place], [place] [id number]      Time coordinating discharge: Over 30 minutes  SIGNED:   Cipriano BunkerPARDEEP Natalio Salois, MD  [facility] 12/23/2020, 1:02 PM Pager   If 7PM-7AM, please contact night-coverage [email or web address]

## 2020-12-23 NOTE — TOC Transition Note (Addendum)
Transition of Care [facility]) - CM/SW Discharge Note   Patient Details  Name: LYNEE ROSENBACH MRN: [id number] Date of Birth: 17-Jun-1946  [facility] CM/SW Contact:  Ida Rogue, LCSW Phone Number: 12/23/2020, 10:21 AM   Clinical Narrative:   Patient seen in follow up to PT recommendation of HH PT.  Found Ms Lux in bed eating breakfast, husband in the room.  She is excited about the prospect of returning home today, and is open to working with [facility] PT.  No agency preference.  She has a rolling walker at home, is in need of a shower chair but her husband is planning on going on an errand to find one.  He plans to be off of work for awhile to help out at home, make sure she is doing OK.  Cindie with [facility] confirmed that they will be able to service patient for PT.  No further needs identified. TOC sign off.  Addendum:  Schedule a hospital follow up appointment with PCP    Final next level of care: Home w Home Health Services Barriers to Discharge: No Barriers Identified   Patient Goals and CMS Choice        Discharge Placement                       Discharge Plan and Services                                     Social Determinants of Health (SDOH) Interventions     Readmission Risk Interventions No flowsheet data found.

## 2021-07-13 ENCOUNTER — Other Ambulatory Visit: Payer: Self-pay

## 2021-07-13 ENCOUNTER — Encounter (HOSPITAL_COMMUNITY): Payer: Self-pay | Admitting: Emergency Medicine

## 2021-07-13 ENCOUNTER — Inpatient Hospital Stay (HOSPITAL_COMMUNITY)
Admission: EM | Admit: 2021-07-13 | Discharge: 2021-08-16 | DRG: 917 | Disposition: E | Payer: Medicare Other | Attending: Critical Care Medicine | Admitting: Critical Care Medicine

## 2021-07-13 DIAGNOSIS — F419 Anxiety disorder, unspecified: Secondary | ICD-10-CM | POA: Diagnosis present

## 2021-07-13 DIAGNOSIS — E871 Hypo-osmolality and hyponatremia: Secondary | ICD-10-CM | POA: Diagnosis present

## 2021-07-13 DIAGNOSIS — Z7983 Long term (current) use of bisphosphonates: Secondary | ICD-10-CM

## 2021-07-13 DIAGNOSIS — E876 Hypokalemia: Secondary | ICD-10-CM | POA: Diagnosis present

## 2021-07-13 DIAGNOSIS — F32A Depression, unspecified: Secondary | ICD-10-CM | POA: Diagnosis present

## 2021-07-13 DIAGNOSIS — Z681 Body mass index (BMI) 19 or less, adult: Secondary | ICD-10-CM | POA: Diagnosis not present

## 2021-07-13 DIAGNOSIS — K5909 Other constipation: Secondary | ICD-10-CM | POA: Diagnosis present

## 2021-07-13 DIAGNOSIS — G9341 Metabolic encephalopathy: Secondary | ICD-10-CM | POA: Diagnosis not present

## 2021-07-13 DIAGNOSIS — Z9151 Personal history of suicidal behavior: Secondary | ICD-10-CM

## 2021-07-13 DIAGNOSIS — Z8249 Family history of ischemic heart disease and other diseases of the circulatory system: Secondary | ICD-10-CM

## 2021-07-13 DIAGNOSIS — G928 Other toxic encephalopathy: Secondary | ICD-10-CM | POA: Diagnosis present

## 2021-07-13 DIAGNOSIS — T50902A Poisoning by unspecified drugs, medicaments and biological substances, intentional self-harm, initial encounter: Secondary | ICD-10-CM | POA: Diagnosis present

## 2021-07-13 DIAGNOSIS — E872 Acidosis, unspecified: Secondary | ICD-10-CM | POA: Diagnosis present

## 2021-07-13 DIAGNOSIS — T402X4A Poisoning by other opioids, undetermined, initial encounter: Secondary | ICD-10-CM | POA: Diagnosis present

## 2021-07-13 DIAGNOSIS — R739 Hyperglycemia, unspecified: Secondary | ICD-10-CM | POA: Diagnosis present

## 2021-07-13 DIAGNOSIS — G4700 Insomnia, unspecified: Secondary | ICD-10-CM | POA: Diagnosis present

## 2021-07-13 DIAGNOSIS — T391X4D Poisoning by 4-Aminophenol derivatives, undetermined, subsequent encounter: Secondary | ICD-10-CM | POA: Diagnosis not present

## 2021-07-13 DIAGNOSIS — D649 Anemia, unspecified: Secondary | ICD-10-CM | POA: Diagnosis present

## 2021-07-13 DIAGNOSIS — T391X4A Poisoning by 4-Aminophenol derivatives, undetermined, initial encounter: Secondary | ICD-10-CM | POA: Diagnosis not present

## 2021-07-13 DIAGNOSIS — B952 Enterococcus as the cause of diseases classified elsewhere: Secondary | ICD-10-CM | POA: Diagnosis present

## 2021-07-13 DIAGNOSIS — Z981 Arthrodesis status: Secondary | ICD-10-CM

## 2021-07-13 DIAGNOSIS — N179 Acute kidney failure, unspecified: Secondary | ICD-10-CM | POA: Diagnosis present

## 2021-07-13 DIAGNOSIS — Z90710 Acquired absence of both cervix and uterus: Secondary | ICD-10-CM

## 2021-07-13 DIAGNOSIS — Z90722 Acquired absence of ovaries, bilateral: Secondary | ICD-10-CM

## 2021-07-13 DIAGNOSIS — K219 Gastro-esophageal reflux disease without esophagitis: Secondary | ICD-10-CM | POA: Diagnosis present

## 2021-07-13 DIAGNOSIS — K631 Perforation of intestine (nontraumatic): Secondary | ICD-10-CM | POA: Diagnosis not present

## 2021-07-13 DIAGNOSIS — I700 Atherosclerosis of aorta: Secondary | ICD-10-CM | POA: Diagnosis present

## 2021-07-13 DIAGNOSIS — E43 Unspecified severe protein-calorie malnutrition: Secondary | ICD-10-CM | POA: Insufficient documentation

## 2021-07-13 DIAGNOSIS — A419 Sepsis, unspecified organism: Secondary | ICD-10-CM | POA: Diagnosis not present

## 2021-07-13 DIAGNOSIS — T402X2A Poisoning by other opioids, intentional self-harm, initial encounter: Secondary | ICD-10-CM | POA: Diagnosis present

## 2021-07-13 DIAGNOSIS — R9431 Abnormal electrocardiogram [ECG] [EKG]: Secondary | ICD-10-CM | POA: Diagnosis not present

## 2021-07-13 DIAGNOSIS — F0390 Unspecified dementia without behavioral disturbance: Secondary | ICD-10-CM | POA: Diagnosis present

## 2021-07-13 DIAGNOSIS — I2510 Atherosclerotic heart disease of native coronary artery without angina pectoris: Secondary | ICD-10-CM | POA: Diagnosis present

## 2021-07-13 DIAGNOSIS — J9601 Acute respiratory failure with hypoxia: Secondary | ICD-10-CM | POA: Diagnosis not present

## 2021-07-13 DIAGNOSIS — Z882 Allergy status to sulfonamides status: Secondary | ICD-10-CM

## 2021-07-13 DIAGNOSIS — N390 Urinary tract infection, site not specified: Secondary | ICD-10-CM | POA: Diagnosis present

## 2021-07-13 DIAGNOSIS — F13231 Sedative, hypnotic or anxiolytic dependence with withdrawal delirium: Secondary | ICD-10-CM | POA: Diagnosis not present

## 2021-07-13 DIAGNOSIS — I4901 Ventricular fibrillation: Secondary | ICD-10-CM | POA: Diagnosis not present

## 2021-07-13 DIAGNOSIS — R54 Age-related physical debility: Secondary | ICD-10-CM | POA: Diagnosis present

## 2021-07-13 DIAGNOSIS — Z20822 Contact with and (suspected) exposure to covid-19: Secondary | ICD-10-CM | POA: Diagnosis present

## 2021-07-13 DIAGNOSIS — T40602D Poisoning by unspecified narcotics, intentional self-harm, subsequent encounter: Secondary | ICD-10-CM | POA: Diagnosis not present

## 2021-07-13 DIAGNOSIS — Z4659 Encounter for fitting and adjustment of other gastrointestinal appliance and device: Secondary | ICD-10-CM

## 2021-07-13 DIAGNOSIS — K5900 Constipation, unspecified: Secondary | ICD-10-CM | POA: Diagnosis present

## 2021-07-13 DIAGNOSIS — R6521 Severe sepsis with septic shock: Secondary | ICD-10-CM | POA: Diagnosis not present

## 2021-07-13 DIAGNOSIS — I501 Left ventricular failure: Secondary | ICD-10-CM | POA: Diagnosis not present

## 2021-07-13 DIAGNOSIS — Z961 Presence of intraocular lens: Secondary | ICD-10-CM | POA: Diagnosis present

## 2021-07-13 DIAGNOSIS — R627 Adult failure to thrive: Secondary | ICD-10-CM | POA: Diagnosis present

## 2021-07-13 DIAGNOSIS — Z452 Encounter for adjustment and management of vascular access device: Secondary | ICD-10-CM

## 2021-07-13 DIAGNOSIS — G8929 Other chronic pain: Secondary | ICD-10-CM | POA: Diagnosis present

## 2021-07-13 DIAGNOSIS — Z781 Physical restraint status: Secondary | ICD-10-CM

## 2021-07-13 DIAGNOSIS — R45851 Suicidal ideations: Secondary | ICD-10-CM | POA: Diagnosis present

## 2021-07-13 DIAGNOSIS — R0603 Acute respiratory distress: Secondary | ICD-10-CM

## 2021-07-13 DIAGNOSIS — T50902D Poisoning by unspecified drugs, medicaments and biological substances, intentional self-harm, subsequent encounter: Secondary | ICD-10-CM | POA: Diagnosis not present

## 2021-07-13 DIAGNOSIS — Z79899 Other long term (current) drug therapy: Secondary | ICD-10-CM

## 2021-07-13 LAB — CBC WITH DIFFERENTIAL/PLATELET
Abs Immature Granulocytes: 0.02 10*3/uL (ref 0.00–0.07)
Basophils Absolute: 0 10*3/uL (ref 0.0–0.1)
Basophils Relative: 0 %
Eosinophils Absolute: 0 10*3/uL (ref 0.0–0.5)
Eosinophils Relative: 0 %
HCT: 34.9 % — ABNORMAL LOW (ref 36.0–46.0)
Hemoglobin: 11.9 g/dL — ABNORMAL LOW (ref 12.0–15.0)
Immature Granulocytes: 0 %
Lymphocytes Relative: 13 %
Lymphs Abs: 1 10*3/uL (ref 0.7–4.0)
MCH: 27.7 pg (ref 26.0–34.0)
MCHC: 34.1 g/dL (ref 30.0–36.0)
MCV: 81.4 fL (ref 80.0–100.0)
Monocytes Absolute: 0.2 10*3/uL (ref 0.1–1.0)
Monocytes Relative: 2 %
Neutro Abs: 6.9 10*3/uL (ref 1.7–7.7)
Neutrophils Relative %: 85 %
Platelets: 204 10*3/uL (ref 150–400)
RBC: 4.29 MIL/uL (ref 3.87–5.11)
RDW: 15.5 % (ref 11.5–15.5)
WBC: 8.1 10*3/uL (ref 4.0–10.5)
nRBC: 0 % (ref 0.0–0.2)

## 2021-07-13 LAB — I-STAT VENOUS BLOOD GAS, ED
Acid-Base Excess: 4 mmol/L — ABNORMAL HIGH (ref 0.0–2.0)
Bicarbonate: 27 mmol/L (ref 20.0–28.0)
Calcium, Ion: 1.13 mmol/L — ABNORMAL LOW (ref 1.15–1.40)
HCT: 37 % (ref 36.0–46.0)
Hemoglobin: 12.6 g/dL (ref 12.0–15.0)
O2 Saturation: 99 %
Potassium: 2.7 mmol/L — CL (ref 3.5–5.1)
Sodium: 126 mmol/L — ABNORMAL LOW (ref 135–145)
TCO2: 28 mmol/L (ref 22–32)
pCO2, Ven: 34.6 mmHg — ABNORMAL LOW (ref 44.0–60.0)
pH, Ven: 7.5 — ABNORMAL HIGH (ref 7.250–7.430)
pO2, Ven: 144 mmHg — ABNORMAL HIGH (ref 32.0–45.0)

## 2021-07-13 LAB — SALICYLATE LEVEL: Salicylate Lvl: <7 mg/dL — ABNORMAL LOW (ref 7.0–30.0)

## 2021-07-13 LAB — HEPATIC FUNCTION PANEL
ALT: 17 U/L (ref 0–44)
AST: 26 U/L (ref 15–41)
Albumin: 3.3 g/dL — ABNORMAL LOW (ref 3.5–5.0)
Alkaline Phosphatase: 69 U/L (ref 38–126)
Bilirubin, Direct: 0.3 mg/dL — ABNORMAL HIGH (ref 0.0–0.2)
Indirect Bilirubin: 1.1 mg/dL — ABNORMAL HIGH (ref 0.3–0.9)
Total Bilirubin: 1.4 mg/dL — ABNORMAL HIGH (ref 0.3–1.2)
Total Protein: 6.8 g/dL (ref 6.5–8.1)

## 2021-07-13 LAB — COMPREHENSIVE METABOLIC PANEL
ALT: 15 U/L (ref 0–44)
AST: 24 U/L (ref 15–41)
Albumin: 3.6 g/dL (ref 3.5–5.0)
Alkaline Phosphatase: 82 U/L (ref 38–126)
Anion gap: 14 (ref 5–15)
BUN: 12 mg/dL (ref 8–23)
CO2: 24 mmol/L (ref 22–32)
Calcium: 9.3 mg/dL (ref 8.9–10.3)
Chloride: 87 mmol/L — ABNORMAL LOW (ref 98–111)
Creatinine, Ser: 0.78 mg/dL (ref 0.44–1.00)
GFR, Estimated: >60 mL/min (ref >60)
Glucose, Bld: 111 mg/dL — ABNORMAL HIGH (ref 70–99)
Potassium: 2.7 mmol/L — CL (ref 3.5–5.1)
Sodium: 125 mmol/L — ABNORMAL LOW (ref 135–145)
Total Bilirubin: 1 mg/dL (ref 0.3–1.2)
Total Protein: 6.5 g/dL (ref 6.5–8.1)

## 2021-07-13 LAB — RESP PANEL BY RT-PCR (FLU A&B, COVID) ARPGX2
Influenza A by PCR: NEGATIVE
Influenza B by PCR: NEGATIVE
SARS Coronavirus 2 by RT PCR: NEGATIVE

## 2021-07-13 LAB — ETHANOL: Alcohol, Ethyl (B): <10 mg/dL (ref <10)

## 2021-07-13 LAB — ACETAMINOPHEN LEVEL
Acetaminophen (Tylenol), Serum: 206 ug/mL (ref 10–30)
Acetaminophen (Tylenol), Serum: 90 ug/mL — ABNORMAL HIGH (ref 10–30)
Acetaminophen (Tylenol), Serum: 25 ug/mL (ref 10–30)

## 2021-07-13 LAB — MRSA NEXT GEN BY PCR, NASAL: MRSA by PCR Next Gen: NOT DETECTED

## 2021-07-13 LAB — GLUCOSE, CAPILLARY
Glucose-Capillary: 121 mg/dL — ABNORMAL HIGH (ref 70–99)
Glucose-Capillary: 134 mg/dL — ABNORMAL HIGH (ref 70–99)

## 2021-07-13 LAB — POTASSIUM: Potassium: 3.7 mmol/L (ref 3.5–5.1)

## 2021-07-13 LAB — MAGNESIUM: Magnesium: 1.4 mg/dL — ABNORMAL LOW (ref 1.7–2.4)

## 2021-07-13 MED ORDER — POTASSIUM CHLORIDE 10 MEQ/100ML IV SOLN
10.0000 meq | Freq: Once | INTRAVENOUS | Status: AC
  Administered 2021-07-13: 15:00:00 10 meq via INTRAVENOUS
  Filled 2021-07-13: qty 100

## 2021-07-13 MED ORDER — CHLORHEXIDINE GLUCONATE CLOTH 2 % EX PADS
6.0000 | MEDICATED_PAD | Freq: Every day | CUTANEOUS | Status: DC
  Administered 2021-07-13 – 2021-07-15 (×2): 6 via TOPICAL

## 2021-07-13 MED ORDER — HEPARIN SODIUM (PORCINE) 5000 UNIT/ML IJ SOLN
5000.0000 [IU] | Freq: Three times a day (TID) | INTRAMUSCULAR | Status: DC
  Administered 2021-07-13 – 2021-07-15 (×7): 5000 [IU] via SUBCUTANEOUS
  Filled 2021-07-13 (×8): qty 1

## 2021-07-13 MED ORDER — CALCIUM GLUCONATE-NACL 1-0.675 GM/50ML-% IV SOLN
1.0000 g | Freq: Once | INTRAVENOUS | Status: AC
  Administered 2021-07-13: 17:00:00 1000 mg via INTRAVENOUS
  Filled 2021-07-13: qty 50

## 2021-07-13 MED ORDER — ONDANSETRON HCL 4 MG/2ML IJ SOLN: 4.0000 mg | Freq: Four times a day (QID) | INTRAMUSCULAR | Status: DC | PRN

## 2021-07-13 MED ORDER — LACTATED RINGERS IV BOLUS
500.0000 mL | Freq: Once | INTRAVENOUS | Status: AC
Start: 2021-07-13 — End: 2021-07-13
  Administered 2021-07-13: 17:00:00 500 mL via INTRAVENOUS

## 2021-07-13 MED ORDER — NALOXONE HCL 2 MG/2ML IJ SOSY
PREFILLED_SYRINGE | INTRAMUSCULAR | Status: AC
  Administered 2021-07-13: 13:00:00 1 mg via INTRAVENOUS
  Filled 2021-07-13: qty 2

## 2021-07-13 MED ORDER — ACETYLCYSTEINE LOAD VIA INFUSION
150.0000 mg/kg | Freq: Once | INTRAVENOUS | Status: AC
  Administered 2021-07-13: 15:00:00 8160 mg via INTRAVENOUS
  Filled 2021-07-13: qty 268

## 2021-07-13 MED ORDER — DEXTROSE 5 % IV SOLN
15.0000 mg/kg/h | INTRAVENOUS | Status: DC
  Administered 2021-07-13 – 2021-07-14 (×2): 15 mg/kg/h via INTRAVENOUS
  Filled 2021-07-13 (×2): qty 90

## 2021-07-13 MED ORDER — NALOXONE HCL 2 MG/2ML IJ SOSY: 1.0000 mg | PREFILLED_SYRINGE | Freq: Once | INTRAMUSCULAR | Status: AC

## 2021-07-13 MED ORDER — POTASSIUM CHLORIDE CRYS ER 20 MEQ PO TBCR
40.0000 meq | EXTENDED_RELEASE_TABLET | Freq: Once | ORAL | Status: AC
  Administered 2021-07-13: 17:00:00 40 meq via ORAL
  Filled 2021-07-13: qty 2

## 2021-07-13 MED ORDER — SODIUM CHLORIDE 0.9 % IV BOLUS
1000.0000 mL | Freq: Once | INTRAVENOUS | Status: AC
  Administered 2021-07-13: 14:00:00 1000 mL via INTRAVENOUS

## 2021-07-13 MED ORDER — DOCUSATE SODIUM 100 MG PO CAPS: 100.0000 mg | ORAL_CAPSULE | Freq: Two times a day (BID) | ORAL | Status: DC | PRN

## 2021-07-13 MED ORDER — POLYETHYLENE GLYCOL 3350 17 G PO PACK: 17.0000 g | PACK | Freq: Every day | ORAL | Status: DC | PRN

## 2021-07-13 MED ORDER — NALOXONE HCL 4 MG/10ML IJ SOLN
0.5000 mg/h | INTRAVENOUS | Status: DC
  Administered 2021-07-13 – 2021-07-14 (×3): 0.5 mg/h via INTRAVENOUS
  Filled 2021-07-13 (×4): qty 10

## 2021-07-13 MED ORDER — PANTOPRAZOLE SODIUM 40 MG IV SOLR
40.0000 mg | Freq: Every day | INTRAVENOUS | Status: DC
  Administered 2021-07-13: 22:00:00 40 mg via INTRAVENOUS
  Filled 2021-07-13: qty 40

## 2021-07-13 MED ORDER — LACTATED RINGERS IV SOLN: INTRAVENOUS | Status: DC

## 2021-07-13 MED ORDER — MAGNESIUM SULFATE 2 GM/50ML IV SOLN
2.0000 g | Freq: Once | INTRAVENOUS | Status: AC
  Administered 2021-07-13: 15:00:00 2 g via INTRAVENOUS
  Filled 2021-07-13: qty 50

## 2021-07-13 NOTE — H&P (Signed)
NAME:  [name], MRN:  [id number], DOB:  02-17-1946, LOS: 0 ADMISSION DATE:  2021-07-19, CONSULTATION DATE:  2021-07-19 REFERRING MD:  Wallace Cullens - EM, CHIEF COMPLAINT:  norco overdose   History of Present Illness:  75 yo F PMH chronic pain, GERD, depression  presented to ED with concern for Norco 5-325 mg overdose. Rx for 100 pills and 20 pills remained --this was reportedly a new Rx. Required Narcan in the ED  x2.   Patient has been more depressed lately, +passive suicidal ideation.  Has really been having FTT at home per husband.  When asked directly if she is trying to kill herself however she denies.  APAP level 206 - started on NAC  Na 125 K 2.7 iCal 1.13 Mag 1.4   Pertinent  Medical History  Anxiety Chronic pain from back problems Severe protein calorie malnutrition POA  Significant Hospital Events: Including procedures, antibiotic start and stop dates in addition to other pertinent events   12/29 admitted  Interim History / Subjective:  Admitting  Objective   Blood pressure 105/65, pulse 90, temperature 97.7 F (36.5 C), resp. rate 20, SpO2 94 %.       No intake or output data in the 24 hours ending 07-19-21 1542 There were no vitals filed for this visit.  Examination: General: frail elderly woman lying in bed HENT: temporal wasting, MM dry Lungs: clear, no wheezing, no accessory muscle use Cardiovascular: RRR, ext warm Abdomen: soft, +BS Extremities: +muscle wasting, no edema Neuro: no asterixis, moves all 4 ext to command Skin: pale  Resolved Hospital Problem list   N/a  Assessment & Plan:   Acute toxic metabolic encephalopathy due to: Norco overdose Acetaminophen toxicity (in setting of norco OD) P -NAC -narcan gtt -follow qtc, LFTs, INR -not transplant candidate  Likely suicide attempt Hx depression Chronic anxiety and pain - Suicide precautions, psych consult once more stable  Severe protein calorie malnutrition POA - RD consult in AM  depending on how does through night  Hypomagnesemia Hypokalemia Hyponatremia Hypocalcemia -got K and mag in ED P -replete K/Mg/Ca  Best Practice (right click and "Reselect all SmartList Selections" daily)   Diet/type: NPO w/ oral meds DVT prophylaxis: SCD GI prophylaxis: PPI Lines: N/A Foley:  N/A Code Status:  full code Last date of multidisciplinary goals of care discussion [12/29 hope for best]  Labs   CBC: Recent Labs  Lab 19-Jul-2021 1245 Jul 19, 2021 1410  WBC 8.1  --   NEUTROABS 6.9  --   HGB 11.9* 12.6  HCT 34.9* 37.0  MCV 81.4  --   PLT 204  --     Basic Metabolic Panel: Recent Labs  Lab Jul 19, 2021 1245 07-19-21 1410  NA 125* 126*  K 2.7* 2.7*  CL 87*  --   CO2 24  --   GLUCOSE 111*  --   BUN 12  --   CREATININE 0.78  --   CALCIUM 9.3  --   MG 1.4*  --    GFR: CrCl cannot be calculated (Unknown ideal weight.). Recent Labs  Lab 19-Jul-2021 1245  WBC 8.1    Liver Function Tests: Recent Labs  Lab 2021/07/19 1245  AST 24  ALT 15  ALKPHOS 82  BILITOT 1.0  PROT 6.5  ALBUMIN 3.6   No results for input(s): LIPASE, AMYLASE in the last 168 hours. No results for input(s): AMMONIA in the last 168 hours.  ABG    Component Value Date/Time   PHART 7.373 07/31/2010 1250  PCO2ART 37.7 07/31/2010 1250   PO2ART (L) 07/31/2010 1250    60.9 CRITICAL RESULT CALLED TO, READ BACK BY AND VERIFIED WITH: ALL RESULTS CALLED TO CHRISTIE EDWARDS, RN AT DR. MCCOMBS OFFICE @ 1255 BY JG BY JFULP,RRT ON 07/31/2010   HCO3 27.0 07/04/2021 1410   TCO2 28 07/04/2021 1410   ACIDBASEDEF 1.0 09/30/2015 1007   O2SAT 99.0 06/26/2021 1410     Coagulation Profile: No results for input(s): INR, PROTIME in the last 168 hours.  Cardiac Enzymes: No results for input(s): CKTOTAL, CKMB, CKMBINDEX, TROPONINI in the last 168 hours.  HbA1C: No results found for: HGBA1C  CBG: No results for input(s): GLUCAP in the last 168 hours.  Review of Systems:    Positive Symptoms in  bold:  Constitutional fevers, chills, weight loss, fatigue, anorexia, malaise  Eyes decreased vision, double vision, eye irritation  Ears, Nose, Mouth, Throat sore throat, trouble swallowing, sinus congestion  Cardiovascular chest pain, paroxysmal nocturnal dyspnea, lower ext edema, palpitations   Respiratory SOB, cough, DOE, hemoptysis, wheezing  Gastrointestinal nausea, vomiting, diarrhea  Genitourinary burning with urination, trouble urinating  Musculoskeletal joint aches, joint swelling, back pain  Integumentary  rashes, skin lesions  Neurological focal weakness, focal numbness, trouble speaking, headaches  Psychiatric depression, anxiety, confusion  Endocrine polyuria, polydipsia, cold intolerance, heat intolerance  Hematologic abnormal bruising, abnormal bleeding, unexplained nose bleeds  Allergic/Immunologic recurrent infections, hives, swollen lymph nodes     Past Medical History:  She,  has a past medical history of Anxiety, Chronic pain, Constipation, GERD (gastroesophageal reflux disease), History of blood transfusion, Migraine, and Pneumonia (2014ish).   Surgical History:   Past Surgical History:  Procedure Laterality Date   ABDOMINAL WALL MESH  REMOVAL     APPENDECTOMY     BLADDER SURGERY     Mesh implanted   CATARACT EXTRACTION W/ INTRAOCULAR LENS  IMPLANT, BILATERAL Bilateral    COLONOSCOPY     KYPHOPLASTY N/A 09/01/2014   Procedure: KYPHOPLASTY;  Surgeon: Emilee Hero, MD;  Location: [facility] OR;  Service: Orthopedics;  Laterality: N/A;  Lumbar 1 kyphoplasty   LAMINECTOMY WITH POSTERIOR LATERAL ARTHRODESIS LEVEL 1 N/A 09/27/2015   Procedure: L3-4, 4-5 Laminectomy with posterior lateral arthrodesis;  Surgeon: Hilda Lias, MD;  Location: [facility]URO ORS;  Service: Neurosurgery;  Laterality: N/A;  L 4-5 Laminectomy with posterior lateral arthrodesis, partial L3 laminectomy   OOPHORECTOMY Right    SHOULDER SURGERY Left    X 2   TONSILLECTOMY       Social  History:   reports that she has never smoked. She has never used smokeless tobacco. She reports that she does not drink alcohol and does not use drugs.   Family History:  Her family history includes Coronary artery disease (age of onset: 34) in her father; Coronary artery disease (age of onset: 72) in her mother.   Allergies Allergies  Allergen Reactions   Sulfa Antibiotics Hives     Home Medications  Prior to Admission medications   Medication Sig Start Date End Date Taking? Authorizing Provider  alendronate (FOSAMAX) 70 MG tablet Take 70 mg by mouth once a week. Sunday 07/14/20  Yes [provider]  ALPRAZolam Prudy Feeler) 1 MG tablet Take 1 mg by mouth at bedtime.   Yes [provider]  cloNIDine (CATAPRES) 0.1 MG tablet Take 0.1 mg by mouth 3 (three) times daily. 06/16/21  Yes [provider]  ergocalciferol (VITAMIN D2) 1.25 MG (50000 UT) capsule Take 1 capsule by mouth once a  week. 11/26/16  Yes [provider]  escitalopram (LEXAPRO) 20 MG tablet Take 20 mg by mouth at bedtime. 06/14/20  Yes [provider]  HYDROcodone-acetaminophen (NORCO) 10-325 MG tablet Take 1 tablet by mouth 3 (three) times daily as needed for pain. 08/12/20  Yes [provider]  mirtazapine (REMERON) 15 MG tablet Take 15 mg by mouth at bedtime. 07/11/21  Yes [provider]  omeprazole (PRILOSEC) 20 MG capsule Take 20 mg by mouth 2 (two) times daily before a meal.   Yes [provider]  QUEtiapine (SEROQUEL) 300 MG tablet Take 600 mg by mouth at bedtime. 09/19/15  Yes [provider]  SALINE MIST SPRAY NA Place 1 spray into the nose daily at 6 (six) AM.   Yes [provider]  sennosides-docusate sodium (SENOKOT-S) 8.6-50 MG tablet Take 2 tablets by mouth at bedtime.   Yes [provider]  zolpidem (AMBIEN) 10 MG tablet Take 10 mg by mouth at bedtime. 06/17/20  Yes [provider]     Critical care time: 45  minutes combined critical care time   Tessie Fass NP Myrla Halsted MD

## 2021-07-13 NOTE — ED Notes (Signed)
Date and time results received: 06/20/2021 1350 (use smartphrase ".now" to insert current time)  Test: K+, Acetaminophen  Critical Value: 2.7, 206  Name of Provider Notified: Dr. Wallace Cullens  Orders Received? Or Actions Taken?: see chart

## 2021-07-13 NOTE — Progress Notes (Signed)
eLink Physician-Brief Progress Note Patient Name: [name] DOB: [birth year] MRN: [id number]   Date of Service  August 02, 2021  HPI/Events of Note  75 yoF OD Norco 5-325 x 80 pills.   eICU Interventions  Tylenol level 206 >>90 Awake & oriented on camera Await q4 LFTs & level -on NAC and on narcan gtt.      Intervention Category Evaluation Type: New Patient Evaluation  Maron Stanzione V. Christell Steinmiller 2021/08/02, 9:09 PM

## 2021-07-13 NOTE — ED Provider Notes (Addendum)
[facility] Provider Note   CSN: [id number] Arrival date & time: 07/12/2021  1236     History Chief Complaint  Patient presents with   Drug Overdose    [name] is a 75 y.o. female.  Patient is a 75 yo female presenting from home via EMS for reported overdose. Patient's family expresses concerns for overdose of Norco prescription 5-325 mg. States pt has prescription for 100 pill and only 20 are left. Patient minimally responsive on arrival of EMS. Narcan given with improved mental status.  The history is provided by the patient and the spouse. No language interpreter was used.  Drug Overdose      Past Medical History:  Diagnosis Date   Anxiety    Chronic pain    Constipation    GERD (gastroesophageal reflux disease)    History of blood transfusion    Migraine    last one since 05/2015   Pneumonia 2014ish    Patient Active Problem List   Diagnosis Date Noted   Protein-calorie malnutrition, severe 07/14/2021   Intentional overdose ([place]) 06/17/2021   Hyponatremia 12/21/2020   AKI (acute kidney injury) ([place]) 09/26/2020   Altered mental status 09/30/2015   Fever 09/30/2015   Insomnia 09/30/2015   Fever and chills 09/30/2015   Lumbar stenosis 09/27/2015   Gastro-esophageal reflux disease without esophagitis 07/15/2015   Adaptive colitis 07/15/2015   Incomplete bladder emptying 07/15/2015   Excessive urination at night 07/15/2015   Nasal septal perforation 07/15/2015   Polyneuropathy, postherpetic 07/15/2015   Compression fracture 09/01/2014   Pars defect 04/24/2014   Pneumonia 04/11/2014   Constipation due to opioid therapy 01/29/2014   Benzodiazepine overdose, unintentional 05/26/2013   Polypharmacy 05/24/2013   Hypokalemia 05/24/2013   Unstable gait 05/24/2013   Anxiety 05/24/2013   Chronic pain 05/24/2013   Clinical depression 08/08/2012   Ilioinguinal neuralgia 04/15/2012   Abdominal pain 02/06/2012    Continuous opioid dependence ([place]) 02/06/2012    Past Surgical History:  Procedure Laterality Date   ABDOMINAL WALL MESH  REMOVAL     APPENDECTOMY     BLADDER SURGERY     Mesh implanted   CATARACT EXTRACTION W/ INTRAOCULAR LENS  IMPLANT, BILATERAL Bilateral    COLONOSCOPY     KYPHOPLASTY N/A 09/01/2014   Procedure: KYPHOPLASTY;  Surgeon: Sinclair Ship, MD;  Location: [place] [facility];  Service: Orthopedics;  Laterality: N/A;  Lumbar 1 kyphoplasty   LAMINECTOMY WITH POSTERIOR LATERAL ARTHRODESIS LEVEL 1 N/A 09/27/2015   Procedure: L3-4, 4-5 Laminectomy with posterior lateral arthrodesis;  Surgeon: Leeroy Cha, MD;  Location: [facility] NEURO ORS;  Service: Neurosurgery;  Laterality: N/A;  L 4-5 Laminectomy with posterior lateral arthrodesis, partial L3 laminectomy   OOPHORECTOMY Right    SHOULDER SURGERY Left    X 2   TONSILLECTOMY       OB History   No obstetric history on file.     Family History  Problem Relation Age of Onset   Coronary artery disease Father 51   Coronary artery disease Mother 72    Social History   Tobacco Use   Smoking status: Never   Smokeless tobacco: Never  Vaping Use   Vaping Use: Never used  Substance Use Topics   Alcohol use: No   Drug use: No    Home Medications Prior to Admission medications   Medication Sig Start Date End Date Taking? Authorizing Provider  alendronate (FOSAMAX) 70 MG tablet Take 70 mg by mouth once a  week. Sunday 07/14/20  Yes [provider]  ALPRAZolam Duanne Moron) 1 MG tablet Take 1 mg by mouth at bedtime.   Yes [provider]  cloNIDine (CATAPRES) 0.1 MG tablet Take 0.1 mg by mouth 3 (three) times daily. 06/16/21  Yes [provider]  ergocalciferol (VITAMIN D2) 1.25 MG (50000 UT) capsule Take 1 capsule by mouth once a week. 11/26/16  Yes [provider]  escitalopram (LEXAPRO) 20 MG tablet Take 20 mg by mouth at bedtime. 06/14/20  Yes [provider]  HYDROcodone-acetaminophen (NORCO)  10-325 MG tablet Take 1 tablet by mouth 3 (three) times daily as needed for pain. 08/12/20  Yes [provider]  mirtazapine (REMERON) 15 MG tablet Take 15 mg by mouth at bedtime. 07/11/21  Yes [provider]  omeprazole (PRILOSEC) 20 MG capsule Take 20 mg by mouth 2 (two) times daily before a meal.   Yes [provider]  QUEtiapine (SEROQUEL) 300 MG tablet Take 600 mg by mouth at bedtime. 09/19/15  Yes [provider]  SALINE MIST SPRAY NA Place 1 spray into the nose daily at 6 (six) AM.   Yes [provider]  sennosides-docusate sodium (SENOKOT-S) 8.6-50 MG tablet Take 2 tablets by mouth at bedtime.   Yes [provider]  zolpidem (AMBIEN) 10 MG tablet Take 10 mg by mouth at bedtime. 06/17/20  Yes [provider]    Allergies    Sulfa antibiotics  Review of Systems   Review of Systems  Unable to perform ROS: Patient unresponsive   Physical Exam Updated Vital Signs BP 129/88    Pulse (!) 128    Temp (!) 100.6 F (38.1 C) (Axillary)    Resp (!) 38    Wt 50.3 kg    SpO2 94%    BMI 19.03 kg/m   Physical Exam Vitals and nursing note reviewed.  Constitutional:      General: She is not in acute distress.    Appearance: She is well-developed.  HENT:     Head: Normocephalic and atraumatic.  Eyes:     Conjunctiva/sclera: Conjunctivae normal.     Pupils: Pupils are equal, round, and reactive to light.  Cardiovascular:     Rate and Rhythm: Normal rate and regular rhythm.     Heart sounds: No murmur heard. Pulmonary:     Effort: Bradypnea and respiratory distress present.     Breath sounds: Normal breath sounds.  Abdominal:     Palpations: Abdomen is soft.     Tenderness: There is no abdominal tenderness.  Musculoskeletal:        General: No swelling.     Cervical back: Neck supple.  Skin:    General: Skin is warm and dry.     Capillary Refill: Capillary refill takes less than 2 seconds.  Neurological:     Mental Status:  She is lethargic.     GCS: GCS eye subscore is 4. GCS verbal subscore is 5. GCS motor subscore is 6.     Comments: Unable to perform neuro exam at this time due to lethargy   Psychiatric:        Mood and Affect: Mood normal.    ED Results / Procedures / Treatments   Labs (all labs ordered are listed, but only abnormal results are displayed) Labs Reviewed  ACETAMINOPHEN LEVEL - Abnormal; Notable for the following components:      Result Value   Acetaminophen (Tylenol), Serum 206 (*)    All other components within  normal limits  SALICYLATE LEVEL - Abnormal; Notable for the following components:   Salicylate Lvl [id number] (*)    All other components within normal limits  CBC WITH DIFFERENTIAL/PLATELET - Abnormal; Notable for the following components:   Hemoglobin 11.9 (*)    HCT 34.9 (*)    All other components within normal limits  COMPREHENSIVE METABOLIC PANEL - Abnormal; Notable for the following components:   Sodium 125 (*)    Potassium 2.7 (*)    Chloride 87 (*)    Glucose, Bld 111 (*)    All other components within normal limits  MAGNESIUM - Abnormal; Notable for the following components:   Magnesium 1.4 (*)    All other components within normal limits  ACETAMINOPHEN LEVEL - Abnormal; Notable for the following components:   Acetaminophen (Tylenol), Serum 90 (*)    All other components within normal limits  HEPATIC FUNCTION PANEL - Abnormal; Notable for the following components:   Albumin 3.3 (*)    Total Bilirubin 1.4 (*)    Bilirubin, Direct 0.3 (*)    Indirect Bilirubin 1.1 (*)    All other components within normal limits  COMPREHENSIVE METABOLIC PANEL - Abnormal; Notable for the following components:   Sodium 128 (*)    Potassium 3.0 (*)    Chloride 96 (*)    CO2 19 (*)    Glucose, Bld 117 (*)    Albumin 3.2 (*)    All other components within normal limits  PROTIME-INR - Abnormal; Notable for the following components:   Prothrombin Time 15.3 (*)    All other  components within normal limits  CBC - Abnormal; Notable for the following components:   WBC 15.6 (*)    RDW 15.9 (*)    All other components within normal limits  GLUCOSE, CAPILLARY - Abnormal; Notable for the following components:   Glucose-Capillary 121 (*)    All other components within normal limits  GLUCOSE, CAPILLARY - Abnormal; Notable for the following components:   Glucose-Capillary 134 (*)    All other components within normal limits  ACETAMINOPHEN LEVEL - Abnormal; Notable for the following components:   Acetaminophen (Tylenol), Serum <10 (*)    All other components within normal limits  HEPATIC FUNCTION PANEL - Abnormal; Notable for the following components:   Total Protein 6.3 (*)    Albumin 3.3 (*)    Total Bilirubin 1.3 (*)    Indirect Bilirubin 1.1 (*)    All other components within normal limits  HEPATIC FUNCTION PANEL - Abnormal; Notable for the following components:   Albumin 3.0 (*)    All other components within normal limits  GLUCOSE, CAPILLARY - Abnormal; Notable for the following components:   Glucose-Capillary 121 (*)    All other components within normal limits  GLUCOSE, CAPILLARY - Abnormal; Notable for the following components:   Glucose-Capillary 119 (*)    All other components within normal limits  COMPREHENSIVE METABOLIC PANEL - Abnormal; Notable for the following components:   Sodium 129 (*)    CO2 16 (*)    Glucose, Bld 147 (*)    Creatinine, Ser 1.26 (*)    Calcium 8.7 (*)    Total Protein 6.2 (*)    Albumin 2.8 (*)    GFR, Estimated 44 (*)    All other components within normal limits  GLUCOSE, CAPILLARY - Abnormal; Notable for the following components:   Glucose-Capillary 121 (*)    All other components within normal limits  GLUCOSE, CAPILLARY - Abnormal;  Notable for the following components:   Glucose-Capillary 149 (*)    All other components within normal limits  GLUCOSE, CAPILLARY - Abnormal; Notable for the following components:    Glucose-Capillary 164 (*)    All other components within normal limits  I-STAT VENOUS BLOOD GAS, ED - Abnormal; Notable for the following components:   pH, Ven 7.500 (*)    pCO2, Ven 34.6 (*)    pO2, Ven 144.0 (*)    Acid-Base Excess 4.0 (*)    Sodium 126 (*)    Potassium 2.7 (*)    Calcium, Ion 1.13 (*)    All other components within normal limits  RESP PANEL BY RT-PCR (FLU A&B, COVID) ARPGX2  MRSA NEXT GEN BY PCR, NASAL  ETHANOL  ACETAMINOPHEN LEVEL  POTASSIUM  ACETAMINOPHEN LEVEL  ACETAMINOPHEN LEVEL  BILIRUBIN, DIRECT  RAPID URINE DRUG SCREEN, HOSP PERFORMED  CBC  MAGNESIUM  PHOSPHORUS  COMPREHENSIVE METABOLIC PANEL  BRAIN NATRIURETIC PEPTIDE    EKG EKG Interpretation  Date/Time:  Thursday July 13 2021 12:43:17 EST Ventricular Rate:  98 PR Interval:  137 QRS Duration: 82 QT Interval:  385 QTC Calculation: 492 R Axis:   66 Text Interpretation: Sinus rhythm Probable left atrial enlargement Borderline prolonged QT interval Confirmed by Edwin Dada ([id number]) on 06/30/2021 2:25:13 PM  Radiology No results found.  Procedures .Critical Care Performed by: Franne Forts, DO Authorized by: Franne Forts, DO   Critical care provider statement:    Critical care time (minutes):  30   Critical care start time:  06/24/2021 1:07 PM   Critical care was time spent personally by me on the following activities:  Development of treatment plan with patient or surrogate, discussions with consultants, evaluation of patient's response to treatment, examination of patient, ordering and review of laboratory studies, ordering and review of radiographic studies, ordering and performing treatments and interventions, pulse oximetry, re-evaluation of patient's condition and review of old charts   Medications Ordered in ED Medications  docusate sodium (COLACE) capsule 100 mg (has no administration in time range)  polyethylene glycol (MIRALAX / GLYCOLAX) packet 17 g (has no administration  in time range)  heparin injection 5,000 Units (5,000 Units Subcutaneous Given 07/14/21 1305)  ondansetron (ZOFRAN) injection 4 mg (has no administration in time range)  Chlorhexidine Gluconate Cloth 2 % PADS 6 each (6 each Topical Given 07/05/2021 2100)  ALPRAZolam (XANAX) tablet 1 mg (1 mg Oral Given 07/14/21 0055)  cloNIDine (CATAPRES) tablet 0.1 mg (0.1 mg Oral Given 07/14/21 1305)  potassium chloride 10 mEq in 100 mL IVPB (10 mEq Intravenous Not Given 07/14/21 1607)  0.9 %  sodium chloride infusion ( Intravenous Infusion Verify 07/14/21 1800)  pantoprazole (PROTONIX) EC tablet 40 mg (40 mg Oral Given 07/14/21 0953)  feeding supplement (ENSURE ENLIVE / ENSURE PLUS) liquid 237 mL (237 mLs Oral Given 07/14/21 2023)  multivitamin with minerals tablet 1 tablet (1 tablet Oral Given 07/14/21 1305)  escitalopram (LEXAPRO) tablet 20 mg (has no administration in time range)  mirtazapine (REMERON) tablet 15 mg (has no administration in time range)  QUEtiapine (SEROQUEL) tablet 600 mg (has no administration in time range)  dexmedetomidine (PRECEDEX) 400 MCG/100ML (4 mcg/mL) infusion (0.4 mcg/kg/hr  50.3 kg Intravenous New Bag/Given 07/14/21 2109)  sodium chloride 0.9 % bolus 1,000 mL (0 mLs Intravenous Stopped 06/16/2021 1546)  potassium chloride 10 mEq in 100 mL IVPB (0 mEq Intravenous Stopped 06/23/2021 1546)  magnesium sulfate IVPB 2 g 50 mL (0 g Intravenous Stopped 06/30/2021  1546)  acetylcysteine (ACETADOTE) 30.5 mg/mL load via infusion 8,160 mg (8,160 mg Intravenous Bolus from Bag 07/02/2021 1457)  naloxone (NARCAN) injection 1 mg (1 mg Intravenous Given 06/30/2021 1258)  calcium gluconate 1 g/ 50 mL sodium chloride IVPB (0 mg Intravenous Stopped 06/23/2021 1807)  potassium chloride SA (KLOR-CON M) CR tablet 40 mEq (40 mEq Oral Given 07/09/2021 1722)  lactated ringers bolus 500 mL (0 mLs Intravenous Stopped 07/01/2021 1807)  potassium chloride 10 mEq in 100 mL IVPB (0 mEq Intravenous Stopped 07/14/21 1734)   oxyCODONE (Oxy IR/ROXICODONE) immediate release tablet 5 mg (5 mg Oral Given 07/14/21 1955)  lactated ringers bolus 500 mL (500 mLs Intravenous New Bag/Given 07/14/21 2030)    ED Course  I have reviewed the triage vital signs and the nursing notes.  Pertinent labs & imaging results that were available during my care of the patient were reviewed by me and considered in my medical decision making (see chart for details).    MDM Rules/Calculators/A&P  9:10 PM 75 yo female presenting from home via EMS for reported overdose of norco. Please see HPI for further details.  ECG demonstrates sinus rhythm. QTC 492 Pt lethargic and difficult to arouse Narcan given x 2. Narcan ggt started. [place] on. IV fluids started.  Hypokalemia and hypomag present. Replaced Ethanol neg Saly neg Acetaminophen 206. NAC ordered.  Liver profile pending Poison controlled contacted by pharmacy with recommendations as seen in note.  UDS pending  9:10 PM Patient sleepy but arousal at this time. Aox3. Husband contacted and at bedside. Up to date.    9:10 PM Vitals currently stable. BP stable. Pt remains sleepy but arousal. NAC started. Patient continues denies SI but concern for suicide attempt due to patient taking 80 pills... Rec pysch eval when medically cleared.  Patient accepted for admission by critical care team. Oncoming ER provider up to date on patient.   Final Clinical Impression(s) / ED Diagnoses Final diagnoses:  Opioid overdose, undetermined intent, initial encounter ([place])  Respiratory distress  Acute respiratory failure with hypoxia ([facility])  Acetaminophen overdose of undetermined intent, initial encounter  Hypokalemia  Hypomagnesemia    Rx / DC Orders ED Discharge Orders     None        Lianne Cure, DO [id number]    Lianne Cure, DO [id number]

## 2021-07-13 NOTE — Progress Notes (Signed)
Poison Control Consult Documentation  [facility] has been contacted by request of Dr. Wallace Cullens due to the patient's ingestion of Norco. Qty 100 Norco 10-325 filled 12/27 Per family 80 tabs not accounted for -- timeline is uncertain -- husband's best guess is between 0500 & 0700 this morning. Pt responded to 1mg  narcan here also received unk amount from EMS prior to arrival will start 0.5 mg/hr narcan gtt now.  ECG (Date/Time): 12/29 1243: PR 137; QRS 82; QT/QTc 385/492 -- Sinus  Labs: Tylenol level 206; Salicylate level nd; Ethanol nd K 2.7; Mag 1.4; SCr 0.78; Na 125  Summary  of [place] Recommendations: Agrees with electrolyte supplementation and with naloxone drip administration Symptomatic management: --IVFs and vasopressors (norepi) for hypotension --Benzodiazepines for seizures / agitation Agrees with starting IV NAC --24 hours NAC administration, minimally --Typical d/c parameters at 24 hours (all must be true): asymptomatic, undetectable acetaminophen level, AST/ALT wnl, and INR < 2.0 (if checked)  See full IV NAC Fact Sheet and Recommendations from [facility] below signature line of this note  Recommendations provided to requesting physician.  [facility], PharmD, BCPS Emergency Medicine Clinical Pharmacist 12-Aug-2021 2:14 PM

## 2021-07-13 NOTE — ED Triage Notes (Signed)
Pt from home via [facility] with reports of overdose. Pt had 100 10mg  hydrocodone filled 2 days ago. Family reports she has taken an estimated 80 tablets today. When EMS arrived pts RR were 8-10. Pt was given narcan on scene.

## 2021-07-14 DIAGNOSIS — T40602D Poisoning by unspecified narcotics, intentional self-harm, subsequent encounter: Secondary | ICD-10-CM

## 2021-07-14 DIAGNOSIS — E43 Unspecified severe protein-calorie malnutrition: Secondary | ICD-10-CM | POA: Insufficient documentation

## 2021-07-14 DIAGNOSIS — T391X4D Poisoning by 4-Aminophenol derivatives, undetermined, subsequent encounter: Secondary | ICD-10-CM

## 2021-07-14 DIAGNOSIS — T50902A Poisoning by unspecified drugs, medicaments and biological substances, intentional self-harm, initial encounter: Secondary | ICD-10-CM | POA: Diagnosis not present

## 2021-07-14 DIAGNOSIS — T50902D Poisoning by unspecified drugs, medicaments and biological substances, intentional self-harm, subsequent encounter: Secondary | ICD-10-CM

## 2021-07-14 LAB — HEPATIC FUNCTION PANEL
ALT: 16 U/L (ref 0–44)
ALT: 15 U/L (ref 0–44)
AST: 25 U/L (ref 15–41)
AST: 29 U/L (ref 15–41)
Albumin: 3.3 g/dL — ABNORMAL LOW (ref 3.5–5.0)
Albumin: 3 g/dL — ABNORMAL LOW (ref 3.5–5.0)
Alkaline Phosphatase: 74 U/L (ref 38–126)
Alkaline Phosphatase: 75 U/L (ref 38–126)
Bilirubin, Direct: 0.2 mg/dL (ref 0.0–0.2)
Bilirubin, Direct: 0.1 mg/dL (ref 0.0–0.2)
Indirect Bilirubin: 1.1 mg/dL — ABNORMAL HIGH (ref 0.3–0.9)
Indirect Bilirubin: 0.7 mg/dL (ref 0.3–0.9)
Total Bilirubin: 1.3 mg/dL — ABNORMAL HIGH (ref 0.3–1.2)
Total Bilirubin: 0.8 mg/dL (ref 0.3–1.2)
Total Protein: 6.3 g/dL — ABNORMAL LOW (ref 6.5–8.1)
Total Protein: 6.7 g/dL (ref 6.5–8.1)

## 2021-07-14 LAB — GLUCOSE, CAPILLARY
Glucose-Capillary: 121 mg/dL — ABNORMAL HIGH (ref 70–99)
Glucose-Capillary: 119 mg/dL — ABNORMAL HIGH (ref 70–99)
Glucose-Capillary: 121 mg/dL — ABNORMAL HIGH (ref 70–99)
Glucose-Capillary: 149 mg/dL — ABNORMAL HIGH (ref 70–99)
Glucose-Capillary: 164 mg/dL — ABNORMAL HIGH (ref 70–99)
Glucose-Capillary: 106 mg/dL — ABNORMAL HIGH (ref 70–99)

## 2021-07-14 LAB — ACETAMINOPHEN LEVEL
Acetaminophen (Tylenol), Serum: 16 ug/mL (ref 10–30)
Acetaminophen (Tylenol), Serum: 10 ug/mL (ref 10–30)
Acetaminophen (Tylenol), Serum: <10 ug/mL — ABNORMAL LOW (ref 10–30)

## 2021-07-14 LAB — COMPREHENSIVE METABOLIC PANEL
ALT: 14 U/L (ref 0–44)
ALT: 14 U/L (ref 0–44)
AST: 27 U/L (ref 15–41)
AST: 29 U/L (ref 15–41)
Albumin: 3.2 g/dL — ABNORMAL LOW (ref 3.5–5.0)
Albumin: 2.8 g/dL — ABNORMAL LOW (ref 3.5–5.0)
Alkaline Phosphatase: 80 U/L (ref 38–126)
Alkaline Phosphatase: 72 U/L (ref 38–126)
Anion gap: 13 (ref 5–15)
Anion gap: 12 (ref 5–15)
BUN: 9 mg/dL (ref 8–23)
BUN: 17 mg/dL (ref 8–23)
CO2: 19 mmol/L — ABNORMAL LOW (ref 22–32)
CO2: 16 mmol/L — ABNORMAL LOW (ref 22–32)
Calcium: 9 mg/dL (ref 8.9–10.3)
Calcium: 8.7 mg/dL — ABNORMAL LOW (ref 8.9–10.3)
Chloride: 96 mmol/L — ABNORMAL LOW (ref 98–111)
Chloride: 101 mmol/L (ref 98–111)
Creatinine, Ser: 0.66 mg/dL (ref 0.44–1.00)
Creatinine, Ser: 1.26 mg/dL — ABNORMAL HIGH (ref 0.44–1.00)
GFR, Estimated: >60 mL/min (ref >60)
GFR, Estimated: 44 mL/min — ABNORMAL LOW (ref >60)
Glucose, Bld: 117 mg/dL — ABNORMAL HIGH (ref 70–99)
Glucose, Bld: 147 mg/dL — ABNORMAL HIGH (ref 70–99)
Potassium: 3 mmol/L — ABNORMAL LOW (ref 3.5–5.1)
Potassium: 3.9 mmol/L (ref 3.5–5.1)
Sodium: 128 mmol/L — ABNORMAL LOW (ref 135–145)
Sodium: 129 mmol/L — ABNORMAL LOW (ref 135–145)
Total Bilirubin: 1.1 mg/dL (ref 0.3–1.2)
Total Bilirubin: 0.6 mg/dL (ref 0.3–1.2)
Total Protein: 6.7 g/dL (ref 6.5–8.1)
Total Protein: 6.2 g/dL — ABNORMAL LOW (ref 6.5–8.1)

## 2021-07-14 LAB — CBC
HCT: 39 % (ref 36.0–46.0)
Hemoglobin: 13.4 g/dL (ref 12.0–15.0)
MCH: 27.5 pg (ref 26.0–34.0)
MCHC: 34.4 g/dL (ref 30.0–36.0)
MCV: 80.1 fL (ref 80.0–100.0)
Platelets: 239 10*3/uL (ref 150–400)
RBC: 4.87 MIL/uL (ref 3.87–5.11)
RDW: 15.9 % — ABNORMAL HIGH (ref 11.5–15.5)
WBC: 15.6 10*3/uL — ABNORMAL HIGH (ref 4.0–10.5)
nRBC: 0 % (ref 0.0–0.2)

## 2021-07-14 LAB — BILIRUBIN, DIRECT: Bilirubin, Direct: 0.1 mg/dL (ref 0.0–0.2)

## 2021-07-14 LAB — PROTIME-INR
INR: 1.2 (ref 0.8–1.2)
Prothrombin Time: 15.3 seconds — ABNORMAL HIGH (ref 11.4–15.2)

## 2021-07-14 LAB — BRAIN NATRIURETIC PEPTIDE: B Natriuretic Peptide: 1888.5 pg/mL — ABNORMAL HIGH (ref 0.0–100.0)

## 2021-07-14 MED ORDER — PANTOPRAZOLE SODIUM 40 MG PO TBEC
40.0000 mg | DELAYED_RELEASE_TABLET | Freq: Every day | ORAL | Status: DC
  Administered 2021-07-14: 10:00:00 40 mg via ORAL
  Filled 2021-07-14: qty 1

## 2021-07-14 MED ORDER — SODIUM CHLORIDE 0.9 % IV SOLN
INTRAVENOUS | Status: DC
Start: 2021-07-14 — End: 2021-07-15

## 2021-07-14 MED ORDER — DEXMEDETOMIDINE HCL IN NACL 400 MCG/100ML IV SOLN
0.4000 ug/kg/h | INTRAVENOUS | Status: DC
  Administered 2021-07-14: 21:00:00 0.4 ug/kg/h via INTRAVENOUS
  Filled 2021-07-14: qty 100

## 2021-07-14 MED ORDER — OXYCODONE HCL 5 MG PO TABS
5.0000 mg | ORAL_TABLET | Freq: Once | ORAL | Status: AC
  Administered 2021-07-14: 20:00:00 5 mg via ORAL
  Filled 2021-07-14: qty 1

## 2021-07-14 MED ORDER — ESCITALOPRAM OXALATE 10 MG PO TABS
20.0000 mg | ORAL_TABLET | Freq: Every day | ORAL | Status: DC
  Administered 2021-07-14: 21:00:00 20 mg via ORAL
  Filled 2021-07-14: qty 2

## 2021-07-14 MED ORDER — QUETIAPINE FUMARATE 100 MG PO TABS
600.0000 mg | ORAL_TABLET | Freq: Every day | ORAL | Status: DC
  Administered 2021-07-14: 21:00:00 600 mg via ORAL
  Filled 2021-07-14: qty 6

## 2021-07-14 MED ORDER — POTASSIUM CHLORIDE 10 MEQ/100ML IV SOLN
10.0000 meq | Freq: Once | INTRAVENOUS | Status: AC
  Administered 2021-07-14: 17:00:00 10 meq via INTRAVENOUS
  Filled 2021-07-14: qty 100

## 2021-07-14 MED ORDER — CLONIDINE HCL 0.1 MG PO TABS
0.1000 mg | ORAL_TABLET | Freq: Three times a day (TID) | ORAL | Status: DC
  Administered 2021-07-14 (×4): 0.1 mg via ORAL
  Filled 2021-07-14 (×5): qty 1

## 2021-07-14 MED ORDER — ENSURE ENLIVE PO LIQD
237.0000 mL | Freq: Three times a day (TID) | ORAL | Status: DC
  Administered 2021-07-14 (×2): 237 mL via ORAL

## 2021-07-14 MED ORDER — ALPRAZOLAM 0.5 MG PO TABS
1.0000 mg | ORAL_TABLET | Freq: Every day | ORAL | Status: DC
  Administered 2021-07-14 (×2): 1 mg via ORAL
  Filled 2021-07-14 (×2): qty 2

## 2021-07-14 MED ORDER — LACTATED RINGERS IV BOLUS
500.0000 mL | Freq: Once | INTRAVENOUS | Status: AC
  Administered 2021-07-14: 21:00:00 500 mL via INTRAVENOUS

## 2021-07-14 MED ORDER — ADULT MULTIVITAMIN W/MINERALS CH
1.0000 | ORAL_TABLET | Freq: Every day | ORAL | Status: DC
  Administered 2021-07-14: 13:00:00 1 via ORAL
  Filled 2021-07-14: qty 1

## 2021-07-14 MED ORDER — MIRTAZAPINE 15 MG PO TABS
15.0000 mg | ORAL_TABLET | Freq: Every day | ORAL | Status: DC
  Administered 2021-07-14: 22:00:00 15 mg via ORAL
  Filled 2021-07-14 (×2): qty 1

## 2021-07-14 MED ORDER — POTASSIUM CHLORIDE 10 MEQ/100ML IV SOLN
10.0000 meq | INTRAVENOUS | Status: AC
  Administered 2021-07-14 (×5): 10 meq via INTRAVENOUS
  Filled 2021-07-14 (×5): qty 100

## 2021-07-14 NOTE — Progress Notes (Signed)
eLink Physician-Brief Progress Note Patient Name: [name] DOB: 23-Aug-1945 MRN: [id number]   Date of Service  07/14/2021  HPI/Events of Note  Patient becoming more tachycardic, concern is that that delirium tremens is evolving.  eICU Interventions  CIWA protocol + Precedex gtt ordered. BNP to try to assess volume status indirectly.        [name] 07/14/2021, 9:02 PM

## 2021-07-14 NOTE — Progress Notes (Signed)
NAME:  [name], MRN:  [id number], DOB:  Dec 20, 1945, LOS: 1 ADMISSION DATE:  04-Aug-2021, CONSULTATION DATE:  08-04-2021 REFERRING MD:  Wallace Cullens - EM, CHIEF COMPLAINT:  norco overdose   History of Present Illness:  75 yo F PMH chronic pain, GERD, depression  presented to ED with concern for Norco 5-325 mg overdose. Rx for 100 pills and 20 pills remained --this was reportedly a new Rx. Required Narcan in the ED  x2.   Patient has been more depressed lately, +passive suicidal ideation.  Has really been having FTT at home per husband.  When asked directly if she is trying to kill herself however she denies.  APAP level 206 - started on NAC  Na 125 K 2.7 iCal 1.13 Mag 1.4   Pertinent  Medical History  Anxiety Chronic pain from back problems Severe protein calorie malnutrition POA  Significant Hospital Events: Including procedures, antibiotic start and stop dates in addition to other pertinent events   12/29 admitted  Interim History / Subjective:  Still on narcan gtt. She feels unwell but cannot describe why. She reports she did not intentionally overdose, that it was an accident that she took 67 norco. Her husband disagrees.   Objective   Blood pressure (!) 165/91, pulse (!) 103, temperature 98.8 F (37.1 C), temperature source Axillary, resp. rate (!) 33, weight 50.3 kg, SpO2 97 %.        Intake/Output Summary (Last 24 hours) at 07/14/2021 0934 Last data filed at 07/14/2021 0700 Gross per 24 hour  Intake 2590.53 ml  Output 100 ml  Net 2490.53 ml   Filed Weights   07/14/21 0500  Weight: 50.3 kg    Examination: General: chronically ill appearing, frail woman sitting up in bed in NAD HENT: temporal wasting, otherwise [place].  Lungs: CTAB, breathing comfortably on  Cardiovascular: S1S2, RRR Abdomen: soft, NT Extremities: no peripheral edema or cyanosis, minimal muscle mass Neuro: awake, alert, answering questions appropriately Psych: flattened affect, poor insight,  cooperative with exam Skin: pallor, no rashes, warm & dry  Na+ 128 K+ 3.0 BUN 9 Cr 0.66 WBC 15.6 Platelets 239 INR 1.2 Acetaminophen level 206> 90> 25>16>10  Resolved Hospital Problem list   N/a  Assessment & Plan:   Acute toxic metabolic encephalopathy due to: Norco overdose Acetaminophen toxicity (in setting of norco OD) P -NAC; can stop when tylenol level is no longer detectable this morning -Appreciate poison control's management. -narcan gtt-- trial of weaning off this morning -not transplant candidate  Suicide attempt Hx depression Chronic anxiety and pain - Suicide precautions, psych consult today.  - will defer to psychiatry when to resume remeron, seroquel, lexopro  Severe protein calorie malnutrition POA -RD consult  Hypomagnesemia Hypokalemia Hypocalcemia -repleted  Hyponatremia -start NS -recheck BMP this afternoon  GERD -change PPI to oral med; this is a PTA med  Husband updated at bedside.   Best Practice (right click and "Reselect all SmartList Selections" daily)   Diet/type: Regular consistency (see orders) DVT prophylaxis: prophylactic heparin  GI prophylaxis: PPI Lines: N/A Foley:  N/A Code Status:  full code Last date of multidisciplinary goals of care discussion [12/30 with husband]  Labs   CBC: Recent Labs  Lab August 04, 2021 1245 08/04/2021 1410 07/14/21 0535  WBC 8.1  --  15.6*  NEUTROABS 6.9  --   --   HGB 11.9* 12.6 13.4  HCT 34.9* 37.0 39.0  MCV 81.4  --  80.1  PLT 204  --  239  This patient is critically ill with multiple organ system failure which requires frequent high complexity decision making, assessment, support, evaluation, and titration of therapies. This was completed through the application of advanced monitoring technologies and extensive interpretation of multiple databases. During this encounter critical care time was devoted to patient care services described in this note for 34 minutes.  Steffanie Dunn, DO 07/14/21 11:11 AM [facility]

## 2021-07-14 NOTE — Progress Notes (Signed)
Initial Nutrition Assessment  DOCUMENTATION CODES:   Severe malnutrition in context of chronic illness  INTERVENTION:   - Ensure Enlive po TID, each supplement provides 350 kcal and 20 grams of protein  - MVI with minerals daily  - Encourage PO intake and provide feeding assistance as needed  NUTRITION DIAGNOSIS:   Severe Malnutrition related to chronic illness (depression, anxiety, chronic pain) as evidenced by severe fat depletion, severe muscle depletion.  GOAL:   Patient will meet greater than or equal to 90% of their needs  MONITOR:   PO intake, Supplement acceptance, Labs, Weight trends  REASON FOR ASSESSMENT:   Consult Assessment of nutrition requirement/status  ASSESSMENT:   75 year old female who presented to the ED on 12/29 with reports of overdose. PMH of chronic pain, GERD, depression, anxiety.  Discussed pt with RN and during ICU rounds. CCM reports pt is a poor historian and pt's husband may be able to provide more accurate information. Pt had ordered lunch (beef and broccoli stir fry) with safety sitter just prior to RD visit. Lunch had not yet arrived.  Spoke with pt at bedside. Pt with very flat affect and delayed responses to RD questions. Pt responses often not exactly appropriate for the question that was asked. Pt reports that she is hungry and thinks that she will be able to eat some of her lunch. She reports that at home she eats 3 meals daily but later states that she skips lunch. Pt reports that she eats oatmeal with sugar and milk for breakfast and drinks sweet tea. RD attempted to find out what pt eats for lunch and dinner but pt continued to report mainly eating oatmeal and cereal. Pt is unsure of her UBW but thinks that she has lost weight. Pt amenable to consuming oral nutrition supplements and states that she has tried Ensure and Boost in the past. RD to order.  Spoke with pt's husband via phone call. He reports pt mainly eats 2 meals daily,  breakfast and dinner. For lunch, pt will have a small snack like applesauce or pudding. Pt has consumed oral nutrition supplements in the past but is not currently drinking them. Pt's husband is unsure of pt's UBW but does report that pt has experienced slow, steady weight loss over time due to decreased appetite as she has gotten older. Pt's husband does not think that pt has been intentionally losing weight.  Reviewed weight history in chart. Noted steady decline in weight over the last 7 years. Pt with a 2.3 kg weight loss since 09/26/20. This is a 4.4% weight loss which is not significant for timeframe. Based on NFPE, pt meets criteria for severe malnutrition.  In addition to oral nutrition supplements, RD to order MVI with minerals daily. Pt's husband in agreement with plan.  Medications reviewed and include: protonix, IV KCl 10 mEq x 6 runs IVF: NS @ 50 ml/hr  Labs reviewed: sodium 128, potassium 3.0, ionized calcium 1.13, magnesium 1.4 on 12/29 CBG's: 119-134  I/O's: +2.4 L since admit  NUTRITION - FOCUSED PHYSICAL EXAM:  Flowsheet Row Most Recent Value  Orbital Region Severe depletion  Upper Arm Region Moderate depletion  Thoracic and Lumbar Region Severe depletion  Buccal Region Severe depletion  Temple Region Moderate depletion  Clavicle Bone Region Severe depletion  Clavicle and Acromion Bone Region Severe depletion  Scapular Bone Region Severe depletion  Dorsal Hand Moderate depletion  Patellar Region Moderate depletion  Anterior Thigh Region Moderate depletion  Posterior Calf Region Moderate depletion  Edema (RD Assessment) None  Hair Reviewed  Eyes Reviewed  Mouth Reviewed  Skin Reviewed  Nails Reviewed       Diet Order:   Diet Order             Diet regular Room service appropriate? Yes; Fluid consistency: Thin  Diet effective now                   EDUCATION NEEDS:   Education needs have been addressed  Skin:  Skin Assessment: Reviewed RN  Assessment  Last BM:  no documented BM  Height:   Ht Readings from Last 1 Encounters:  09/26/20 5\' 4"  (1.626 m)    Weight:   Wt Readings from Last 1 Encounters:  07/14/21 50.3 kg    BMI:  Body mass index is 19.03 kg/m.  Estimated Nutritional Needs:   Kcal:  1450-1650  Protein:  60-75 grams  Fluid:  1.4-1.6 L    07/17/2021, MS, RD, LDN Inpatient Clinical Dietitian Please see [facility] for contact information.

## 2021-07-14 NOTE — TOC Progression Note (Signed)
Transition of Care (TOC) - Initial/Assessment Note   Patient Details  Name: [name] Date of Birth: 01-25-1946   Transition of Care [facility]) CM/SW Contact:    Kloi Brodman C Tarpley-Carter, LCSWA Phone Number: 07/14/2021, 9:21 AM    Transition of Care Department [facility]) has reviewed patient and no TOC needs have been identified at this time. We will continue to monitor patient advancement through interdisciplinary progression rounds. If new patient transition needs arise, please place a TOC consult.          Patient Goals and CMS Choice        Expected Discharge Plan and Services                                                Prior Living Arrangements/Services                       Activities of Daily Living      Permission Sought/Granted                  Emotional Assessment              Admission diagnosis:  Hypokalemia [E87.6] Hypomagnesemia [E83.42] Suicide attempt (HCC) [T14.91XA] Respiratory distress [R06.03] Acute respiratory failure with hypoxia (HCC) [J96.01] Acetaminophen overdose of undetermined intent, initial encounter [T39.1X4A] Opioid overdose, undetermined intent, initial encounter (HCC) [T40.2X4A] Intentional overdose (HCC) [T50.902A] Patient Active Problem List   Diagnosis Date Noted   Intentional overdose (HCC) 07-19-21   Hyponatremia 12/21/2020   AKI (acute kidney injury) (HCC) 09/26/2020   Altered mental status 09/30/2015   Fever 09/30/2015   Insomnia 09/30/2015   Fever and chills 09/30/2015   Lumbar stenosis 09/27/2015   Gastro-esophageal reflux disease without esophagitis 07/15/2015   Adaptive colitis 07/15/2015   Incomplete bladder emptying 07/15/2015   Excessive urination at night 07/15/2015   Nasal septal perforation 07/15/2015   Polyneuropathy, postherpetic 07/15/2015   Compression fracture 09/01/2014   Pars defect 04/24/2014   Pneumonia 04/11/2014   Constipation due to opioid therapy  01/29/2014   Benzodiazepine overdose, unintentional 05/26/2013   Polypharmacy 05/24/2013   Hypokalemia 05/24/2013   Unstable gait 05/24/2013   Anxiety 05/24/2013   Chronic pain 05/24/2013   Clinical depression 08/08/2012   Ilioinguinal neuralgia 04/15/2012   Abdominal pain 02/06/2012   Continuous opioid dependence (HCC) 02/06/2012   PCP:  Laqueta Due., MD Pharmacy:   [facility] [phone] - [place], [place] - [address] [place] [address] Phone: [phone] Fax: [phone]     Social Determinants of Health (SDOH) Interventions    Readmission Risk Interventions Readmission Risk Prevention Plan 12/23/2020  Transportation Screening Complete  PCP or Specialist Appt within 3-5 Days Complete  HRI or Home Care Consult Complete  Social Work Consult for Recovery Care Planning/Counseling Not Complete  Palliative Care Screening Not Applicable  Medication Review Oceanographer) Not Complete  Some recent data might be hidden

## 2021-07-14 NOTE — Progress Notes (Signed)
Transfer order canceled. Remain in icu to treat tachycardia

## 2021-07-14 NOTE — Progress Notes (Signed)
Increasing tachycardia, getting EKG and trial of low dose opiates in case she is in opiate withdrawal. Avoiding additional tylenol.

## 2021-07-14 NOTE — Progress Notes (Signed)
eLink Physician-Brief Progress Note Patient Name: [name] DOB: 1946/04/04 MRN: [id number]   Date of Service  07/14/2021  HPI/Events of Note  pt BP elevated, 178/96, map 117, anxiety level is escalating, tachycardic 118-120's  eICU Interventions  Resumed home xanax & clonidine Seroquel & remeron still on hold May have to consider dc narcan (since on chronic narcotics)     Intervention Category Intermediate Interventions: Hypertension - evaluation and management  Dontez Hauss V. Karrigan Messamore 07/14/2021, 12:22 AM

## 2021-07-14 NOTE — Progress Notes (Signed)
eLink Physician-Brief Progress Note Patient Name: [name] DOB: 1946/01/05 MRN: [id number]   Date of Service  07/14/2021  HPI/Events of Note  Patient with sinus tachycardia.  eICU Interventions  Telemetry ordered. LR 500 ml iv fluid bolus x 1 ordered.        Thomasene Lot Cornelia Walraven 07/14/2021, 8:13 PM

## 2021-07-14 NOTE — Progress Notes (Signed)
[facility] ADULT ICU REPLACEMENT PROTOCOL   The patient does apply for the [facility] Adult ICU Electrolyte Replacment Protocol based on the criteria listed below:   1.Exclusion criteria: TCTS patients, ECMO patients, and Dialysis patients 2. Is GFR >/= 30 ml/min? Yes.    Patient's GFR today is >60 3. Is SCr </= 2? Yes.   Patient's SCr is 0.66 mg/dL 4. Did SCr increase >/= 0.5 in 24 hours? No. 5.Pt's weight >40kg  Yes.   6. Abnormal electrolyte(s): K+ 3.0  7. Electrolytes replaced per protocol 8.  Call MD STAT for K+ </= 2.5, Phos </= 1, or Mag </= 1 Physician:  n/a  Melvern Banker 07/14/2021 6:38 AM

## 2021-07-14 NOTE — Consult Note (Signed)
Face To Face Psychiatry Consult  Patient Identification:  [name] Date of Evaluation:  07/14/2021 Referring Provider:Laura Chestine Spore, DO  History of Present Illness:  Patient is 75 year old Caucasian with past medical history of chronic pain, GERD, depression, dementia admitted to the hospital after overdose on Norco 5-325 mg.  Refill was 00 pills and 20 pills remain.  Patient appeared depressed.  Consult was called for suicidal attempt.  Patient seen, chart reviewed.  Patient appeared confused and have difficulty remembering things.  She admitted taking overdose on opiates but she also reported it was accidental.  She see Dr Milagros Evener for her psychiatric medication.  She admitted long history of depression but denies any recent stressors other than her memory has been deteriorated.  She is cooperative with the staff denies any hallucination but appears confused and disoriented.  She remembered that she is in the hospital at unable to provide the details.  She is only alert and oriented x2.  She lives with her husband who helps for her daily activities.  Patient does not drive.  Patient appears sad with flat affect.  She did not provide much information about her current depressive symptoms other than feeling sad and isolated.  She reported  not sleeping well and not eating well.  Unable to get collateral information at this time.  Patient unable to provide current psychotropic medication.  Past Psychiatric History:  Patient reported history of overdose many years ago because of depression.  Denies any history of hallucination.  Outpatient psychiatrist is Dr. Milagros Evener.  Is the patient at risk to self? Yes.   Has the patient been a risk to self in the past 6 months? Yes.   Has the patient been a risk to self within the distant past? No. Is the patient a risk to others No. Has the patient been a risk to others in the past 6 months? No. Has the patient been a risk to others within the  distant past? No.   Past Medical History:     Past Medical History:  Diagnosis Date   Anxiety    Chronic pain    Constipation    GERD (gastroesophageal reflux disease)    History of blood transfusion    Migraine    last one since 05/2015   Pneumonia 2014ish       Past Surgical History:  Procedure Laterality Date   ABDOMINAL WALL MESH  REMOVAL     APPENDECTOMY     BLADDER SURGERY     Mesh implanted   CATARACT EXTRACTION W/ INTRAOCULAR LENS  IMPLANT, BILATERAL Bilateral    COLONOSCOPY     KYPHOPLASTY N/A 09/01/2014   Procedure: KYPHOPLASTY;  Surgeon: Emilee Hero, MD;  Location[facility] OR;  Service: Orthopedics;  Laterality: N/A;  Lumbar 1 kyphoplasty   LAMINECTOMY WITH POSTERIOR LATERAL ARTHRODESIS LEVEL 1 N/A 09/27/2015   Procedure: L3-4, 4-5 Laminectomy with posterior lateral arthrodesis;  Surgeon: Hilda Lias, MD;  Location: [facility] NEURO ORS;  Service: Neurosurgery;  Laterality: N/A;  L 4-5 Laminectomy with posterior lateral arthrodesis, partial L3 laminectomy   OOPHORECTOMY Right    SHOULDER SURGERY Left    X 2   TONSILLECTOMY      Labs: Recent Results (from the past 2160 hour(s))  Acetaminophen level     Status: Abnormal   Collection Time: 07-Aug-2021 12:45 PM  Result Value Ref Range   Acetaminophen (Tylenol), Serum 206 (HH) 10 - 30 ug/mL    Comment: CRITICAL RESULT CALLED TO, READ  BACK BY AND VERIFIED WITH: DOSS,M RN @ 1350 08-03-21 LEONARD,A (NOTE) Therapeutic concentrations vary significantly. A range of 10-30 ug/mL  may be an effective concentration for many patients. However, some  are best treated at concentrations outside of this range. Acetaminophen concentrations >150 ug/mL at 4 hours after ingestion  and >50 ug/mL at 12 hours after ingestion are often associated with  toxic reactions.  Performed at [facility], [address] [place], [place] [address]   Salicylate level     Status: Abnormal   Collection Time: 03-Aug-2021 12:45 PM  Result Value  Ref Range   Salicylate Lvl <7.0 (L) 7.0 - 30.0 mg/dL    Comment: Performed at [facility] Lab, [address], [place], [place] [address]  CBC with Differential     Status: Abnormal   Collection Time: 08/03/2021 12:45 PM  Result Value Ref Range   WBC 8.1 4.0 - 10.5 K/uL   RBC 4.29 3.87 - 5.11 MIL/uL   Hemoglobin 11.9 (L) 12.0 - 15.0 g/dL   HCT 09.8 (L) 11.9 - 14.7 %   MCV 81.4 80.0 - 100.0 fL   MCH 27.7 26.0 - 34.0 pg   MCHC 34.1 30.0 - 36.0 g/dL   RDW 82.9 56.2 - 13.0 %   Platelets 204 150 - 400 K/uL   nRBC 0.0 0.0 - 0.2 %   Neutrophils Relative % 85 %   Neutro Abs 6.9 1.7 - 7.7 K/uL   Lymphocytes Relative 13 %   Lymphs Abs 1.0 0.7 - 4.0 K/uL   Monocytes Relative 2 %   Monocytes Absolute 0.2 0.1 - 1.0 K/uL   Eosinophils Relative 0 %   Eosinophils Absolute 0.0 0.0 - 0.5 K/uL   Basophils Relative 0 %   Basophils Absolute 0.0 0.0 - 0.1 K/uL   Immature Granulocytes 0 %   Abs Immature Granulocytes 0.02 0.00 - 0.07 K/uL    Comment: Performed at [facility] Lab, [address] [place], [place] [address]  Comprehensive metabolic panel     Status: Abnormal   Collection Time: 08/03/2021 12:45 PM  Result Value Ref Range   Sodium 125 (L) 135 - 145 mmol/L   Potassium 2.7 (LL) 3.5 - 5.1 mmol/L    Comment: CRITICAL RESULT CALLED TO, READ BACK BY AND VERIFIED WITH: DOSS,M RN @ 1350 2021/08/03 LEONARD,A    Chloride 87 (L) 98 - 111 mmol/L   CO2 24 22 - 32 mmol/L   Glucose, Bld 111 (H) 70 - 99 mg/dL    Comment: Glucose reference range applies only to samples taken after fasting for at least 8 hours.   BUN 12 8 - 23 mg/dL   Creatinine, Ser 4.69 0.44 - 1.00 mg/dL   Calcium 9.3 8.9 - 62.9 mg/dL   Total Protein 6.5 6.5 - 8.1 g/dL   Albumin 3.6 3.5 - 5.0 g/dL   AST 24 15 - 41 U/L   ALT 15 0 - 44 U/L   Alkaline Phosphatase 82 38 - 126 U/L   Total Bilirubin 1.0 0.3 - 1.2 mg/dL   GFR, Estimated >52 >84 mL/min    Comment: (NOTE) Calculated using the CKD-EPI Creatinine Equation (2021)     Anion gap 14 5 - 15    Comment: Performed at [facility], [address] [place], [place] [address]  Ethanol     Status: None   Collection Time: 2021/08/03 12:45 PM  Result Value Ref Range   Alcohol, Ethyl (B) <10 <10  mg/dL    Comment: (NOTE) Lowest detectable limit for serum alcohol is 10 mg/dL.  For medical purposes only. Performed at [facility], [address] [place], [place] [address]   Magnesium     Status: Abnormal   Collection Time: 2021-08-07 12:45 PM  Result Value Ref Range   Magnesium 1.4 (L) 1.7 - 2.4 mg/dL    Comment: Performed at [facility], [address] [place], [place] [address]  Resp Panel by RT-PCR (Flu A&B, Covid) Nasopharyngeal Swab     Status: None   Collection Time: 07-Aug-2021 12:45 PM   Specimen: Nasopharyngeal Swab; Nasopharyngeal(NP) swabs in vial transport medium  Result Value Ref Range   SARS Coronavirus 2 by RT PCR NEGATIVE NEGATIVE    Comment: (NOTE) SARS-CoV-2 target nucleic acids are NOT DETECTED.  The SARS-CoV-2 RNA is generally detectable in upper respiratory specimens during the acute phase of infection. The lowest concentration of SARS-CoV-2 viral copies this assay can detect is 138 copies/mL. A negative result does not preclude SARS-Cov-2 infection and should not be used as the sole basis for treatment or other patient management decisions. A negative result may occur with  improper specimen collection/handling, submission of specimen other than nasopharyngeal swab, presence of viral mutation(s) within the areas targeted by this assay, and inadequate number of viral copies(<138 copies/mL). A negative result must be combined with clinical observations, patient history, and epidemiological information. The expected result is Negative.  Fact Sheet for Patients:  BloggerCourse.com  Fact Sheet for Healthcare Providers:  SeriousBroker.it  This test is no t yet approved or  cleared by the [place] FDA and  has been authorized for detection and/or diagnosis of SARS-CoV-2 by FDA under an Emergency Use Authorization (EUA). This EUA will remain  in effect (meaning this test can be used) for the duration of the COVID-19 declaration under Section 564(b)(1) of the Act, 21 U.S.C.section 360bbb-3(b)(1), unless the authorization is terminated  or revoked sooner.       Influenza A by PCR NEGATIVE NEGATIVE   Influenza B by PCR NEGATIVE NEGATIVE    Comment: (NOTE) The Xpert Xpress SARS-CoV-2/FLU/RSV plus assay is intended as an aid in the diagnosis of influenza from Nasopharyngeal swab specimens and should not be used as a sole basis for treatment. Nasal washings and aspirates are unacceptable for Xpert Xpress SARS-CoV-2/FLU/RSV testing.  Fact Sheet for Patients: BloggerCourse.com  Fact Sheet for Healthcare Providers: SeriousBroker.it  This test is not yet approved or cleared by the [place] FDA and has been authorized for detection and/or diagnosis of SARS-CoV-2 by FDA under an Emergency Use Authorization (EUA). This EUA will remain in effect (meaning this test can be used) for the duration of the COVID-19 declaration under Section 564(b)(1) of the Act, 21 U.S.C. section 360bbb-3(b)(1), unless the authorization is terminated or revoked.  Performed at [facility], [address] [place], [place] [address]   I-Stat venous blood gas, [facility])     Status: Abnormal   Collection Time: 07-Aug-2021  2:10 PM  Result Value Ref Range   pH, Ven 7.500 (H) 7.250 - 7.430   pCO2, Ven 34.6 (L) 44.0 - 60.0 mmHg   pO2, Ven 144.0 (H) 32.0 - 45.0 mmHg   Bicarbonate 27.0 20.0 - 28.0 mmol/L   TCO2 28 22 - 32 mmol/L   O2 Saturation 99.0 %   Acid-Base Excess 4.0 (H) 0.0 - 2.0 mmol/L   Sodium 126 (L) 135 - 145 mmol/L   Potassium  2.7 (LL) 3.5 - 5.1 mmol/L   Calcium, Ion 1.13 (L) 1.15 - 1.40 mmol/L   HCT 37.0 36.0  - 46.0 %   Hemoglobin 12.6 12.0 - 15.0 g/dL   Sample type VENOUS    Comment NOTIFIED PHYSICIAN   Acetaminophen level     Status: Abnormal   Collection Time: 2021/07/22  4:00 PM  Result Value Ref Range   Acetaminophen (Tylenol), Serum 90 (H) 10 - 30 ug/mL    Comment: (NOTE) Therapeutic concentrations vary significantly. A range of 10-30 ug/mL  may be an effective concentration for many patients. However, some  are best treated at concentrations outside of this range. Acetaminophen concentrations >150 ug/mL at 4 hours after ingestion  and >50 ug/mL at 12 hours after ingestion are often associated with  toxic reactions.  Performed at [facility], [address] [place], [place] [address]   MRSA Next Gen by PCR, Nasal     Status: None   Collection Time: July 22, 2021  9:00 PM   Specimen: Nasal Mucosa; Nasal Swab  Result Value Ref Range   MRSA by PCR Next Gen NOT DETECTED NOT DETECTED    Comment: (NOTE) The GeneXpert MRSA Assay (FDA approved for NASAL specimens only), is one component of a comprehensive MRSA colonization surveillance program. It is not intended to diagnose MRSA infection nor to guide or monitor treatment for MRSA infections. Test performance is not FDA approved in patients less than 62 years old. Performed at [facility], [address], [place], [place] [address]   Glucose, capillary     Status: Abnormal   Collection Time: 07/22/2021  9:02 PM  Result Value Ref Range   Glucose-Capillary 121 (H) 70 - 99 mg/dL    Comment: Glucose reference range applies only to samples taken after fasting for at least 8 hours.  Acetaminophen level     Status: None   Collection Time: 22-Jul-2021  9:43 PM  Result Value Ref Range   Acetaminophen (Tylenol), Serum 25 10 - 30 ug/mL    Comment: (NOTE) Therapeutic concentrations vary significantly. A range of 10-30 ug/mL  may be an effective concentration for many patients. However, some  are best treated at concentrations outside of  this range. Acetaminophen concentrations >150 ug/mL at 4 hours after ingestion  and >50 ug/mL at 12 hours after ingestion are often associated with  toxic reactions.  Performed at [facility], [address], [place], [place] [address]   Hepatic function panel     Status: Abnormal   Collection Time: 07/22/2021  9:43 PM  Result Value Ref Range   Total Protein 6.8 6.5 - 8.1 g/dL   Albumin 3.3 (L) 3.5 - 5.0 g/dL   AST 26 15 - 41 U/L   ALT 17 0 - 44 U/L   Alkaline Phosphatase 69 38 - 126 U/L   Total Bilirubin 1.4 (H) 0.3 - 1.2 mg/dL   Bilirubin, Direct 0.3 (H) 0.0 - 0.2 mg/dL   Indirect Bilirubin 1.1 (H) 0.3 - 0.9 mg/dL    Comment: Performed at [facility], [address] [place], [place] [address]  Potassium     Status: None   Collection Time: 07-22-21  9:43 PM  Result Value Ref Range   Potassium 3.7 3.5 - 5.1 mmol/L    Comment: DELTA CHECK NOTED Performed at [facility], [address], [place], [place] [address]   Glucose, capillary     Status: Abnormal   Collection Time:  2021/07/31 11:37 PM  Result Value Ref Range   Glucose-Capillary 134 (H) 70 - 99 mg/dL    Comment: Glucose reference range applies only to samples taken after fasting for at least 8 hours.  Acetaminophen level     Status: None   Collection Time: 07/14/21  1:41 AM  Result Value Ref Range   Acetaminophen (Tylenol), Serum 16 10 - 30 ug/mL    Comment: (NOTE) Therapeutic concentrations vary significantly. A range of 10-30 ug/mL  may be an effective concentration for many patients. However, some  are best treated at concentrations outside of this range. Acetaminophen concentrations >150 ug/mL at 4 hours after ingestion  and >50 ug/mL at 12 hours after ingestion are often associated with  toxic reactions.  Performed at [facility], [address] [place], [place] [address]   Hepatic function panel     Status: Abnormal   Collection Time: 07/14/21  1:41 AM  Result Value Ref Range    Total Protein 6.3 (L) 6.5 - 8.1 g/dL   Albumin 3.3 (L) 3.5 - 5.0 g/dL   AST 25 15 - 41 U/L   ALT 16 0 - 44 U/L   Alkaline Phosphatase 74 38 - 126 U/L   Total Bilirubin 1.3 (H) 0.3 - 1.2 mg/dL   Bilirubin, Direct 0.2 0.0 - 0.2 mg/dL   Indirect Bilirubin 1.1 (H) 0.3 - 0.9 mg/dL    Comment: Performed at [facility], [address], [place], [place] [address]  Glucose, capillary     Status: Abnormal   Collection Time: 07/14/21  3:57 AM  Result Value Ref Range   Glucose-Capillary 121 (H) 70 - 99 mg/dL    Comment: Glucose reference range applies only to samples taken after fasting for at least 8 hours.  Comprehensive metabolic panel     Status: Abnormal   Collection Time: 07/14/21  5:35 AM  Result Value Ref Range   Sodium 128 (L) 135 - 145 mmol/L   Potassium 3.0 (L) 3.5 - 5.1 mmol/L   Chloride 96 (L) 98 - 111 mmol/L   CO2 19 (L) 22 - 32 mmol/L   Glucose, Bld 117 (H) 70 - 99 mg/dL    Comment: Glucose reference range applies only to samples taken after fasting for at least 8 hours.   BUN 9 8 - 23 mg/dL   Creatinine, Ser 9.37 0.44 - 1.00 mg/dL   Calcium 9.0 8.9 - 90.2 mg/dL   Total Protein 6.7 6.5 - 8.1 g/dL   Albumin 3.2 (L) 3.5 - 5.0 g/dL   AST 27 15 - 41 U/L   ALT 14 0 - 44 U/L   Alkaline Phosphatase 80 38 - 126 U/L   Total Bilirubin 1.1 0.3 - 1.2 mg/dL   GFR, Estimated >40 >97 mL/min    Comment: (NOTE) Calculated using the CKD-EPI Creatinine Equation (2021)    Anion gap 13 5 - 15    Comment: Performed at [facility], [address] [place], [place] [address]  Protime-INR     Status: Abnormal   Collection Time: 07/14/21  5:35 AM  Result Value Ref Range   Prothrombin Time 15.3 (H) 11.4 - 15.2 seconds   INR 1.2 0.8 - 1.2    Comment: (NOTE) INR goal varies based on device and disease states. Performed at [facility], [address] [place], [place] [address]   CBC     Status: Abnormal   Collection Time: 07/14/21  5:35 AM  Result Value Ref Range   WBC  15.6 (H) 4.0 - 10.5 K/uL   RBC 4.87 3.87 - 5.11 MIL/uL   Hemoglobin 13.4 12.0 - 15.0 g/dL   HCT 50.3 88.8 - 28.0 %   MCV 80.1 80.0 - 100.0 fL   MCH 27.5 26.0 - 34.0 pg   MCHC 34.4 30.0 - 36.0 g/dL   RDW 03.4 (H) 91.7 - 91.5 %   Platelets 239 150 - 400 K/uL   nRBC 0.0 0.0 - 0.2 %    Comment: Performed at [facility], [address], [place], [place] [address]  Acetaminophen level     Status: None   Collection Time: 07/14/21  5:35 AM  Result Value Ref Range   Acetaminophen (Tylenol), Serum 10 10 - 30 ug/mL    Comment: (NOTE) Therapeutic concentrations vary significantly. A range of 10-30 ug/mL  may be an effective concentration for many patients. However, some  are best treated at concentrations outside of this range. Acetaminophen concentrations >150 ug/mL at 4 hours after ingestion  and >50 ug/mL at 12 hours after ingestion are often associated with  toxic reactions.  Performed at [facility], [address] [place], [place] [address]   Bilirubin, direct     Status: None   Collection Time: 07/14/21  5:35 AM  Result Value Ref Range   Bilirubin, Direct 0.1 0.0 - 0.2 mg/dL    Comment: Performed at [facility], [address], [place], [place] [address]  Glucose, capillary     Status: Abnormal   Collection Time: 07/14/21  7:22 AM  Result Value Ref Range   Glucose-Capillary 119 (H) 70 - 99 mg/dL    Comment: Glucose reference range applies only to samples taken after fasting for at least 8 hours.  Acetaminophen level     Status: Abnormal   Collection Time: 07/14/21  9:43 AM  Result Value Ref Range   Acetaminophen (Tylenol), Serum <10 (L) 10 - 30 ug/mL    Comment: (NOTE) Therapeutic concentrations vary significantly. A range of 10-30 ug/mL  may be an effective concentration for many patients. However, some  are best treated at concentrations outside of this range. Acetaminophen concentrations >150 ug/mL at 4 hours after ingestion  and >50 ug/mL at 12 hours  after ingestion are often associated with  toxic reactions.  Performed at [facility], [address] [place], [place] [address]   Hepatic function panel     Status: Abnormal   Collection Time: 07/14/21  9:43 AM  Result Value Ref Range   Total Protein 6.7 6.5 - 8.1 g/dL   Albumin 3.0 (L) 3.5 - 5.0 g/dL   AST 29 15 - 41 U/L   ALT 15 0 - 44 U/L   Alkaline Phosphatase 75 38 - 126 U/L   Total Bilirubin 0.8 0.3 - 1.2 mg/dL   Bilirubin, Direct 0.1 0.0 - 0.2 mg/dL   Indirect Bilirubin 0.7 0.3 - 0.9 mg/dL    Comment: Performed at [facility], [address] [place], [place] [address]  Glucose, capillary     Status: Abnormal   Collection Time: 07/14/21 11:14 AM  Result Value Ref Range   Glucose-Capillary 121 (H) 70 - 99 mg/dL    Comment: Glucose reference range applies only to samples taken after fasting for at least 8 hours.     Allergies:  Allergies  Allergen Reactions   Sulfa Antibiotics Hives    Current Medications:  Prior to  Admission medications   Medication Sig Start Date End Date Taking? Authorizing Provider  alendronate (FOSAMAX) 70 MG tablet Take 70 mg by mouth once a week. Sunday 07/14/20  Yes [provider]  ALPRAZolam Prudy Feeler) 1 MG tablet Take 1 mg by mouth at bedtime.   Yes [provider]  cloNIDine (CATAPRES) 0.1 MG tablet Take 0.1 mg by mouth 3 (three) times daily. 06/16/21  Yes [provider]  ergocalciferol (VITAMIN D2) 1.25 MG (50000 UT) capsule Take 1 capsule by mouth once a week. 11/26/16  Yes [provider]  escitalopram (LEXAPRO) 20 MG tablet Take 20 mg by mouth at bedtime. 06/14/20  Yes [provider]  HYDROcodone-acetaminophen (NORCO) 10-325 MG tablet Take 1 tablet by mouth 3 (three) times daily as needed for pain. 08/12/20  Yes [provider]  mirtazapine (REMERON) 15 MG tablet Take 15 mg by mouth at bedtime. 07/11/21  Yes [provider]  omeprazole (PRILOSEC) 20 MG capsule Take  20 mg by mouth 2 (two) times daily before a meal.   Yes [provider]  QUEtiapine (SEROQUEL) 300 MG tablet Take 600 mg by mouth at bedtime. 09/19/15  Yes [provider]  SALINE MIST SPRAY NA Place 1 spray into the nose daily at 6 (six) AM.   Yes [provider]  sennosides-docusate sodium (SENOKOT-S) 8.6-50 MG tablet Take 2 tablets by mouth at bedtime.   Yes [provider]  zolpidem (AMBIEN) 10 MG tablet Take 10 mg by mouth at bedtime. 06/17/20  Yes [provider]    Social History:    reports that she has never smoked. She has never used smokeless tobacco. She reports that she does not drink alcohol and does not use drugs.   Family History:    Family History  Problem Relation Age of Onset   Coronary artery disease Father 72   Coronary artery disease Mother 60   Psychiatric Specialty Exam: Physical Exam Neurological:     Mental Status: She is disoriented.    Review of Systems  Psychiatric/Behavioral:  Positive for confusion, dysphoric mood and suicidal ideas.    Blood pressure 125/81, pulse (!) 103, temperature 98.1 F (36.7 C), temperature source Oral, resp. rate (!) 33, weight 50.3 kg, SpO2 97 %.Body mass index is 19.03 kg/m.  General Appearance: Fairly Groomed  Eye Contact:  Minimal  Speech:  Slow  Volume:  Decreased  Mood:  Anxious and Dysphoric  Affect:  Flat  Thought Process:  Descriptions of Associations: Loose  Orientation:  Full (Time, Place, and Person)  Thought Content:   thought blocking  Suicidal Thoughts:  Yes.  with intent/plan  Homicidal Thoughts:  No  Memory:  Immediate;   Poor Recent;   Poor Remote;   Poor  Judgement:  Impaired  Insight:  Lacking  Psychomotor Activity:  Decreased  Concentration:  Concentration: Fair and Attention Span: Fair  Recall:  Poor  Fund of Knowledge:  Poor  Language:  Poor  Akathisia:  No  Handed:  Right  AIMS (if indicated):     Assets:  Housing  ADL's:  Impaired   Cognition:  Impaired,  Moderate  Sleep:   fair         Assessment/Plan: Patient is 75 year old Caucasian married female admitted with overdose on opiates.   Continue sitter for patient's safety. Restart home medication which are mirtazapine, Seroquel, Lexapro and Xanax. Increase collateral information including psychosocial details. Social worker to talk to husband about her living situation. Patient will require inpatient  psychiatric treatment possibly a general psych unit upon medically cleared. Psychiatry will follow up the patient to monitor the progress.   Kathryne Sharper MD Psychiatry

## 2021-07-15 ENCOUNTER — Inpatient Hospital Stay (HOSPITAL_COMMUNITY): Payer: Medicare Other

## 2021-07-15 DIAGNOSIS — R9431 Abnormal electrocardiogram [ECG] [EKG]: Secondary | ICD-10-CM

## 2021-07-15 DIAGNOSIS — R6521 Severe sepsis with septic shock: Secondary | ICD-10-CM

## 2021-07-15 DIAGNOSIS — J9601 Acute respiratory failure with hypoxia: Secondary | ICD-10-CM | POA: Diagnosis not present

## 2021-07-15 DIAGNOSIS — T50902A Poisoning by unspecified drugs, medicaments and biological substances, intentional self-harm, initial encounter: Secondary | ICD-10-CM

## 2021-07-15 DIAGNOSIS — A419 Sepsis, unspecified organism: Secondary | ICD-10-CM | POA: Diagnosis not present

## 2021-07-15 DIAGNOSIS — G9341 Metabolic encephalopathy: Secondary | ICD-10-CM | POA: Diagnosis not present

## 2021-07-15 LAB — CBC
HCT: 31.2 % — ABNORMAL LOW (ref 36.0–46.0)
HCT: 25.7 % — ABNORMAL LOW (ref 36.0–46.0)
Hemoglobin: 10.7 g/dL — ABNORMAL LOW (ref 12.0–15.0)
Hemoglobin: 8.9 g/dL — ABNORMAL LOW (ref 12.0–15.0)
MCH: 27.3 pg (ref 26.0–34.0)
MCH: 28.3 pg (ref 26.0–34.0)
MCHC: 34.3 g/dL (ref 30.0–36.0)
MCHC: 34.6 g/dL (ref 30.0–36.0)
MCV: 79.6 fL — ABNORMAL LOW (ref 80.0–100.0)
MCV: 81.6 fL (ref 80.0–100.0)
Platelets: 162 10*3/uL (ref 150–400)
Platelets: 141 10*3/uL — ABNORMAL LOW (ref 150–400)
RBC: 3.92 MIL/uL (ref 3.87–5.11)
RBC: 3.15 MIL/uL — ABNORMAL LOW (ref 3.87–5.11)
RDW: 16 % — ABNORMAL HIGH (ref 11.5–15.5)
RDW: 16.6 % — ABNORMAL HIGH (ref 11.5–15.5)
WBC: 13.6 10*3/uL — ABNORMAL HIGH (ref 4.0–10.5)
WBC: 14.5 10*3/uL — ABNORMAL HIGH (ref 4.0–10.5)
nRBC: 0 % (ref 0.0–0.2)
nRBC: 0 % (ref 0.0–0.2)

## 2021-07-15 LAB — POCT I-STAT 7, (LYTES, BLD GAS, ICA,H+H)
Acid-base deficit: 6 mmol/L — ABNORMAL HIGH (ref 0.0–2.0)
Bicarbonate: 20 mmol/L (ref 20.0–28.0)
Calcium, Ion: 1.21 mmol/L (ref 1.15–1.40)
HCT: 27 % — ABNORMAL LOW (ref 36.0–46.0)
Hemoglobin: 9.2 g/dL — ABNORMAL LOW (ref 12.0–15.0)
O2 Saturation: 100 %
Patient temperature: 99.3
Potassium: 3.9 mmol/L (ref 3.5–5.1)
Sodium: 133 mmol/L — ABNORMAL LOW (ref 135–145)
TCO2: 21 mmol/L — ABNORMAL LOW (ref 22–32)
pCO2 arterial: 41.1 mmHg (ref 32.0–48.0)
pH, Arterial: 7.298 — ABNORMAL LOW (ref 7.350–7.450)
pO2, Arterial: 339 mmHg — ABNORMAL HIGH (ref 83.0–108.0)

## 2021-07-15 LAB — ECHOCARDIOGRAM COMPLETE
AR max vel: 2.01 cm2
AV Area VTI: 1.9 cm2
AV Area mean vel: 1.93 cm2
AV Mean grad: 10 mmHg
AV Peak grad: 18.7 mmHg
Ao pk vel: 2.16 m/s
Area-P 1/2: 5.02 cm2
Height: 64 in
S' Lateral: 2.3 cm
Weight: 1774.26 oz

## 2021-07-15 LAB — URINALYSIS, ROUTINE W REFLEX MICROSCOPIC
Bilirubin Urine: NEGATIVE
Glucose, UA: NEGATIVE mg/dL
Ketones, ur: NEGATIVE mg/dL
Nitrite: NEGATIVE
Protein, ur: NEGATIVE mg/dL
RBC / HPF: >50 RBC/hpf — ABNORMAL HIGH (ref 0–5)
Specific Gravity, Urine: 1.006 (ref 1.005–1.030)
WBC, UA: >50 WBC/hpf — ABNORMAL HIGH (ref 0–5)
pH: 5 (ref 5.0–8.0)

## 2021-07-15 LAB — COMPREHENSIVE METABOLIC PANEL
ALT: 13 U/L (ref 0–44)
ALT: 14 U/L (ref 0–44)
AST: 28 U/L (ref 15–41)
AST: 29 U/L (ref 15–41)
Albumin: 2.4 g/dL — ABNORMAL LOW (ref 3.5–5.0)
Albumin: 2 g/dL — ABNORMAL LOW (ref 3.5–5.0)
Alkaline Phosphatase: 82 U/L (ref 38–126)
Alkaline Phosphatase: 82 U/L (ref 38–126)
Anion gap: 12 (ref 5–15)
Anion gap: 9 (ref 5–15)
BUN: 31 mg/dL — ABNORMAL HIGH (ref 8–23)
BUN: 27 mg/dL — ABNORMAL HIGH (ref 8–23)
CO2: 15 mmol/L — ABNORMAL LOW (ref 22–32)
CO2: 19 mmol/L — ABNORMAL LOW (ref 22–32)
Calcium: 8.2 mg/dL — ABNORMAL LOW (ref 8.9–10.3)
Calcium: 8.2 mg/dL — ABNORMAL LOW (ref 8.9–10.3)
Chloride: 100 mmol/L (ref 98–111)
Chloride: 104 mmol/L (ref 98–111)
Creatinine, Ser: 2.04 mg/dL — ABNORMAL HIGH (ref 0.44–1.00)
Creatinine, Ser: 1.04 mg/dL — ABNORMAL HIGH (ref 0.44–1.00)
GFR, Estimated: 25 mL/min — ABNORMAL LOW (ref >60)
GFR, Estimated: 56 mL/min — ABNORMAL LOW (ref >60)
Glucose, Bld: 150 mg/dL — ABNORMAL HIGH (ref 70–99)
Glucose, Bld: 130 mg/dL — ABNORMAL HIGH (ref 70–99)
Potassium: 2.9 mmol/L — ABNORMAL LOW (ref 3.5–5.1)
Potassium: 3.5 mmol/L (ref 3.5–5.1)
Sodium: 127 mmol/L — ABNORMAL LOW (ref 135–145)
Sodium: 132 mmol/L — ABNORMAL LOW (ref 135–145)
Total Bilirubin: 0.4 mg/dL (ref 0.3–1.2)
Total Bilirubin: 0.6 mg/dL (ref 0.3–1.2)
Total Protein: 5.2 g/dL — ABNORMAL LOW (ref 6.5–8.1)
Total Protein: 4.5 g/dL — ABNORMAL LOW (ref 6.5–8.1)

## 2021-07-15 LAB — GLUCOSE, CAPILLARY
Glucose-Capillary: 147 mg/dL — ABNORMAL HIGH (ref 70–99)
Glucose-Capillary: 131 mg/dL — ABNORMAL HIGH (ref 70–99)
Glucose-Capillary: 133 mg/dL — ABNORMAL HIGH (ref 70–99)
Glucose-Capillary: 96 mg/dL (ref 70–99)
Glucose-Capillary: 197 mg/dL — ABNORMAL HIGH (ref 70–99)
Glucose-Capillary: 165 mg/dL — ABNORMAL HIGH (ref 70–99)

## 2021-07-15 LAB — MAGNESIUM
Magnesium: 1.8 mg/dL (ref 1.7–2.4)
Magnesium: 2.6 mg/dL — ABNORMAL HIGH (ref 1.7–2.4)

## 2021-07-15 LAB — TROPONIN I (HIGH SENSITIVITY): Troponin I (High Sensitivity): 85 ng/L — ABNORMAL HIGH (ref <18)

## 2021-07-15 LAB — PHOSPHORUS
Phosphorus: <1 mg/dL — CL (ref 2.5–4.6)
Phosphorus: 4.5 mg/dL (ref 2.5–4.6)

## 2021-07-15 LAB — PROTIME-INR
INR: 1.1 (ref 0.8–1.2)
Prothrombin Time: 13.8 seconds (ref 11.4–15.2)

## 2021-07-15 LAB — LACTIC ACID, PLASMA: Lactic Acid, Venous: 1.4 mmol/L (ref 0.5–1.9)

## 2021-07-15 MED ORDER — VITAL HIGH PROTEIN PO LIQD: 1000.0000 mL | ORAL | Status: DC

## 2021-07-15 MED ORDER — CALCIUM GLUCONATE-NACL 1-0.675 GM/50ML-% IV SOLN
1.0000 g | Freq: Once | INTRAVENOUS | Status: AC
  Administered 2021-07-15: 1000 mg via INTRAVENOUS
  Filled 2021-07-15: qty 50

## 2021-07-15 MED ORDER — DEXMEDETOMIDINE HCL IN NACL 400 MCG/100ML IV SOLN: 0.2000 ug/kg/h | INTRAVENOUS | Status: DC

## 2021-07-15 MED ORDER — MAGNESIUM HYDROXIDE 400 MG/5ML PO SUSP
200.0000 mL | Freq: Once | ORAL | Status: AC
  Administered 2021-07-15: 200 mL via RECTAL
  Filled 2021-07-15: qty 60

## 2021-07-15 MED ORDER — INSULIN ASPART 100 UNIT/ML IJ SOLN
0.0000 [IU] | INTRAMUSCULAR | Status: DC
  Administered 2021-07-15: 2 [IU] via SUBCUTANEOUS
  Administered 2021-07-16: 5 [IU] via SUBCUTANEOUS

## 2021-07-15 MED ORDER — DOCUSATE SODIUM 50 MG/5ML PO LIQD
100.0000 mg | Freq: Two times a day (BID) | ORAL | Status: DC
  Administered 2021-07-15: 100 mg
  Filled 2021-07-15: qty 10

## 2021-07-15 MED ORDER — POLYETHYLENE GLYCOL 3350 17 G PO PACK
17.0000 g | PACK | Freq: Two times a day (BID) | ORAL | Status: DC
  Administered 2021-07-15: 17 g
  Filled 2021-07-15: qty 1

## 2021-07-15 MED ORDER — MAGNESIUM SULFATE 2 GM/50ML IV SOLN
2.0000 g | Freq: Once | INTRAVENOUS | Status: AC
  Administered 2021-07-15: 2 g via INTRAVENOUS
  Filled 2021-07-15: qty 50

## 2021-07-15 MED ORDER — LORAZEPAM 2 MG/ML IJ SOLN: 0.5000 mg | INTRAMUSCULAR | Status: DC | PRN

## 2021-07-15 MED ORDER — PANTOPRAZOLE SODIUM 40 MG IV SOLR
40.0000 mg | INTRAVENOUS | Status: DC
  Administered 2021-07-15: 40 mg via INTRAVENOUS
  Filled 2021-07-15: qty 40

## 2021-07-15 MED ORDER — FENTANYL CITRATE (PF) 100 MCG/2ML IJ SOLN
INTRAMUSCULAR | Status: AC
  Filled 2021-07-15: qty 2

## 2021-07-15 MED ORDER — SODIUM CHLORIDE 0.9 % IV SOLN
250.0000 mL | INTRAVENOUS | Status: DC
  Administered 2021-07-15: 250 mL via INTRAVENOUS

## 2021-07-15 MED ORDER — NOREPINEPHRINE 4 MG/250ML-% IV SOLN: 0.0000 ug/min | INTRAVENOUS | Status: DC

## 2021-07-15 MED ORDER — ROCURONIUM BROMIDE 10 MG/ML (PF) SYRINGE
PREFILLED_SYRINGE | INTRAVENOUS | Status: AC
  Filled 2021-07-15: qty 10

## 2021-07-15 MED ORDER — LACTATED RINGERS IV BOLUS (SEPSIS)
1000.0000 mL | Freq: Once | INTRAVENOUS | Status: AC
  Administered 2021-07-15: 1000 mL via INTRAVENOUS

## 2021-07-15 MED ORDER — NOREPINEPHRINE 4 MG/250ML-% IV SOLN
2.0000 ug/min | INTRAVENOUS | Status: DC
  Administered 2021-07-15: 6 ug/min via INTRAVENOUS
  Filled 2021-07-15: qty 250

## 2021-07-15 MED ORDER — POLYETHYLENE GLYCOL 3350 17 G PO PACK
17.0000 g | PACK | Freq: Every day | ORAL | Status: DC
  Administered 2021-07-15: 17 g
  Filled 2021-07-15: qty 1

## 2021-07-15 MED ORDER — FENTANYL CITRATE (PF) 100 MCG/2ML IJ SOLN: 25.0000 ug | INTRAMUSCULAR | Status: DC | PRN

## 2021-07-15 MED ORDER — FENTANYL CITRATE (PF) 100 MCG/2ML IJ SOLN
100.0000 ug | Freq: Once | INTRAMUSCULAR | Status: AC
  Administered 2021-07-15: 100 ug via INTRAVENOUS

## 2021-07-15 MED ORDER — MIDAZOLAM HCL 2 MG/2ML IJ SOLN
2.0000 mg | Freq: Once | INTRAMUSCULAR | Status: AC
  Administered 2021-07-15: 2 mg via INTRAVENOUS
  Filled 2021-07-15: qty 2

## 2021-07-15 MED ORDER — VANCOMYCIN VARIABLE DOSE PER UNSTABLE RENAL FUNCTION (PHARMACIST DOSING): Status: DC

## 2021-07-15 MED ORDER — PHENYLEPHRINE HCL-NACL 20-0.9 MG/250ML-% IV SOLN
25.0000 ug/min | INTRAVENOUS | Status: DC
  Administered 2021-07-15: 25 ug/min via INTRAVENOUS
  Filled 2021-07-15: qty 250

## 2021-07-15 MED ORDER — DOCUSATE SODIUM 50 MG/5ML PO LIQD: 100.0000 mg | Freq: Two times a day (BID) | ORAL | Status: DC | PRN

## 2021-07-15 MED ORDER — PROSOURCE TF PO LIQD: 45.0000 mL | Freq: Two times a day (BID) | ORAL | Status: DC

## 2021-07-15 MED ORDER — ORAL CARE MOUTH RINSE
15.0000 mL | OROMUCOSAL | Status: DC
  Administered 2021-07-15 – 2021-07-16 (×6): 15 mL via OROMUCOSAL

## 2021-07-15 MED ORDER — PHENYLEPHRINE HCL-NACL 20-0.9 MG/250ML-% IV SOLN
0.0000 ug/min | INTRAVENOUS | Status: DC
  Administered 2021-07-15: 120 ug/min via INTRAVENOUS
  Filled 2021-07-15: qty 250

## 2021-07-15 MED ORDER — VANCOMYCIN HCL 1250 MG/250ML IV SOLN
1250.0000 mg | Freq: Once | INTRAVENOUS | Status: AC
  Administered 2021-07-15: 1250 mg via INTRAVENOUS
  Filled 2021-07-15: qty 250

## 2021-07-15 MED ORDER — SODIUM CHLORIDE 0.9 % IV SOLN: 2.0000 g | INTRAVENOUS | Status: DC

## 2021-07-15 MED ORDER — SENNOSIDES-DOCUSATE SODIUM 8.6-50 MG PO TABS
1.0000 | ORAL_TABLET | Freq: Two times a day (BID) | ORAL | Status: DC
  Administered 2021-07-15 (×2): 1
  Filled 2021-07-15 (×2): qty 1

## 2021-07-15 MED ORDER — CHLORHEXIDINE GLUCONATE 0.12% ORAL RINSE (MEDLINE KIT)
15.0000 mL | Freq: Two times a day (BID) | OROMUCOSAL | Status: DC
  Administered 2021-07-15 (×2): 15 mL via OROMUCOSAL

## 2021-07-15 MED ORDER — VITAL 1.5 CAL PO LIQD
1000.0000 mL | ORAL | Status: DC
  Administered 2021-07-15: 1000 mL
  Filled 2021-07-15: qty 1000

## 2021-07-15 MED ORDER — POTASSIUM CHLORIDE 20 MEQ PO PACK
40.0000 meq | PACK | Freq: Once | ORAL | Status: AC
  Administered 2021-07-15: 40 meq
  Filled 2021-07-15: qty 2

## 2021-07-15 MED ORDER — STERILE WATER FOR INJECTION IV SOLN
INTRAVENOUS | Status: DC
  Filled 2021-07-15 (×2): qty 1000

## 2021-07-15 MED ORDER — METRONIDAZOLE 500 MG/100ML IV SOLN
500.0000 mg | Freq: Two times a day (BID) | INTRAVENOUS | Status: DC
  Administered 2021-07-15 (×2): 500 mg via INTRAVENOUS
  Filled 2021-07-15 (×2): qty 100

## 2021-07-15 MED ORDER — POLYETHYLENE GLYCOL 3350 17 G PO PACK: 17.0000 g | PACK | Freq: Every day | ORAL | Status: DC | PRN

## 2021-07-15 MED ORDER — ADULT MULTIVITAMIN LIQUID CH
15.0000 mL | Freq: Every day | ORAL | Status: DC
  Administered 2021-07-15: 15 mL
  Filled 2021-07-15 (×2): qty 15

## 2021-07-15 MED ORDER — SODIUM CHLORIDE 0.9 % IV SOLN
2.0000 g | Freq: Once | INTRAVENOUS | Status: AC
  Administered 2021-07-15: 2 g via INTRAVENOUS
  Filled 2021-07-15: qty 2

## 2021-07-15 MED ORDER — ROCURONIUM BROMIDE 10 MG/ML (PF) SYRINGE
50.0000 mg | PREFILLED_SYRINGE | Freq: Once | INTRAVENOUS | Status: AC
Start: 2021-07-15 — End: 2021-07-15
  Administered 2021-07-15: 50 mg via INTRAVENOUS

## 2021-07-15 MED ORDER — POTASSIUM PHOSPHATES 15 MMOLE/5ML IV SOLN
45.0000 mmol | Freq: Once | INTRAVENOUS | Status: AC
  Administered 2021-07-15: 45 mmol via INTRAVENOUS
  Filled 2021-07-15: qty 15

## 2021-07-15 MED ORDER — LACTATED RINGERS IV BOLUS (SEPSIS)
500.0000 mL | Freq: Once | INTRAVENOUS | Status: AC
  Administered 2021-07-15: 500 mL via INTRAVENOUS

## 2021-07-15 MED ORDER — LACTATED RINGERS IV BOLUS (SEPSIS)
250.0000 mL | Freq: Once | INTRAVENOUS | Status: AC
  Administered 2021-07-15: 250 mL via INTRAVENOUS

## 2021-07-15 MED ORDER — FENTANYL CITRATE (PF) 100 MCG/2ML IJ SOLN
25.0000 ug | INTRAMUSCULAR | Status: DC | PRN
  Administered 2021-07-15: 100 ug via INTRAVENOUS
  Administered 2021-07-15: 50 ug via INTRAVENOUS
  Administered 2021-07-15: 100 ug via INTRAVENOUS
  Administered 2021-07-15: 50 ug via INTRAVENOUS
  Administered 2021-07-15 – 2021-07-16 (×3): 100 ug via INTRAVENOUS
  Filled 2021-07-15 (×7): qty 2

## 2021-07-15 MED ORDER — NOREPINEPHRINE 16 MG/250ML-% IV SOLN
0.0000 ug/min | INTRAVENOUS | Status: DC
  Administered 2021-07-16: 10 ug/min via INTRAVENOUS
  Filled 2021-07-15: qty 250

## 2021-07-15 NOTE — Progress Notes (Signed)
Pharmacy Antibiotic Note  [name] is a 75 y.o. female admitted on July 28, 2021 with sepsis.  Pharmacy has been consulted for Cefepime and Vancomycin dosing.  WBC 13.6, Tmax 103.6 overnight. Pale. Tachy. SCr doubled at 2.04.   Plan: Cefepime 2g IV x1 now, then Cefepime 2g IV every 24 hours Vancomycin 1250mg  IV x1.  Will follow-up renal function to schedule further dosing.  Monitor culture results and clinical status  Weight: 50.3 kg (110 lb 14.3 oz)  Temp (24hrs), Avg:99.9 F (37.7 C), Min:97.8 F (36.6 C), Max:103.6 F (39.8 C)  Recent Labs  Lab 07-28-21 1245 07/14/21 0535 07/14/21 1748 07/15/21 0150  WBC 8.1 15.6*  --  13.6*  CREATININE 0.78 0.66 1.26* 2.04*    CrCl cannot be calculated (Unknown ideal weight.).    Allergies  Allergen Reactions   Sulfa Antibiotics Hives    Antimicrobials this admission: Cefepime 12/31 >> Vancomycin 12/31 >>  Dose adjustments this admission:  Microbiology results: 12/31 BCx: sent 12/29 MRSA PCR: negative  Thank you for allowing pharmacy to be a part of this patients care.  1/30, PharmD, BCPS, BCCCP Clinical Pharmacist Please refer to [facility] for [facility] numbers 07/15/2021 8:00 AM

## 2021-07-15 NOTE — Progress Notes (Signed)
Patient ID: [name], female   DOB: Sep 11, 1945, 75 y.o.   MRN: [id number] CT scan reviewed.   No free air, large distended colon. No need for emergent surgery Please call back if needed

## 2021-07-15 NOTE — Progress Notes (Signed)
KUB concerning for free air vs gastric distention.  Lateral decub KUB concerning for free air. CT Abd pelvis ordered STAT, paged surgery for consult.  Steffanie Dunn, DO 07/15/21 10:29 AM  Pulmonary & Critical Care

## 2021-07-15 NOTE — Progress Notes (Signed)
NAME:  [name], MRN:  [id number], DOB:  March 17, 1946, LOS: 2 ADMISSION DATE:  07/15/2021, CONSULTATION DATE:  06/18/2021 REFERRING MD:  Wallace Cullens - EM, CHIEF COMPLAINT:  norco overdose   History of Present Illness:  75 yo F PMH chronic pain, GERD, depression  presented to ED with concern for Norco 5-325 mg overdose. Rx for 100 pills and 20 pills remained --this was reportedly a new Rx. Required Narcan in the ED  x2.   Patient has been more depressed lately, +passive suicidal ideation.  Has really been having FTT at home per husband.  When asked directly if she is trying to kill herself however she denies.  APAP level 206 - started on NAC  Na 125 K 2.7 iCal 1.13 Mag 1.4   Pertinent  Medical History  Anxiety Chronic pain from back problems Severe protein calorie malnutrition POA  Significant Hospital Events: Including procedures, antibiotic start and stop dates in addition to other pertinent events   12/29 admitted  Interim History / Subjective:  Overnight more tachycardic, febrile. Required straight cath for not voiding-- had over 1.5L drained. This morning has been more confused.   Objective   Blood pressure (!) 89/52, pulse (!) 116, temperature 99.3 F (37.4 C), temperature source Axillary, resp. rate (!) 25, weight 50.3 kg, SpO2 98 %.        Intake/Output Summary (Last 24 hours) at 07/15/2021 0753 Last data filed at 07/15/2021 0531 Gross per 24 hour  Intake 2270.51 ml  Output 1750 ml  Net 520.51 ml    Filed Weights   07/14/21 0500  Weight: 50.3 kg    Examination: General: critically ill appearing woman lying in bed in NAD HENT: Mild temporal wasting, eyes anicteric, oral mucosa dry Lungs: Tachypnea, no accessory muscle use.  Breathing comfortably nasal cannula Cardiovascular: S1-S2, tachycardic, regular rhythm Abdomen: Soft, nontender Extremities: Minimal muscle mass, no lower extremity edema, no cyanosis Neuro: Sleepy, difficult to arouse.  Initially aroused to  trapezius squeeze and noxious stimuli, later was taking more stimulation to wake her up.  Follow some commands, but quickly falls back asleep Psych: Unable to assess today Skin: Warm, dry, pallor  Na+ 127 K+ 2.9 BUN 31 Cr 2.04 WBC 13.6 Platelets 162  Resolved Hospital Problem list   N/a  Assessment & Plan:   Septic shock, unknown source. Worry about UTI with urinary retention overnight. -empiric cefepime, vanc -LR 30cc/kg bolus -blood cultures; unfortunately not drawn prior to antibiotics due to severity of illness -UA, urine culture -place foley for urinary retention -vasopressors to maintain MAP >65 -echo, trop, EKG to ensure not cardiac cause of shock, but this seems less likely -lactic acid  Acute toxic metabolic encephalopathy due to sepsis now, but initially due to Norco overdose with opiate and acetaminophen toxicity   -intubated for airway protection -empiric sepsis treatment -completed NAC and was off narcan -Appreciate poison control's management.  Acute respiratory failure with hypoxia Cardiogenic pulmonary edema - LTVV, 4 to 8 cc/kg ideal body weight with goal plateau less than 30 and driving pressure less than 15 - VAP prevention protocol - Pad protocol for sedation-fentanyl as needed only right now. - Daily SAT and SBT as appropriate.  AKI, worry about ATN due to sepsis. Was on IVF yesterday, so unlikely pre-renal azotemia Anion gap metabolic acidosis -Renally dose meds, avoid nephrotoxic meds - Strict I's/O - Foley -Sodium bicarb infusion  Suicide attempt Hx depression Chronic anxiety and pain - Suicide precautions> in restraints now so doesn't need  sitter until extubated. Psychiatry recommending geri-Psych -when encephalopathy improves, can resume PTA depression meds per Psychiatry's recommendations   Severe protein calorie malnutrition POA -RD consult, start TF  Hypomagnesemia Hypokalemia Hypocalcemia -repleted, recheck this  afternoon  Hyponatremia -Switch saline to sodium bicarb - Continue to monitor  GERD -con't PPI; this is a PTA med  Acute anemia-possibly dilutional  - Transfuse for hemoglobin less than 7 or hemodynamically significant bleeding - Continue to monitor  Husband updated throughout the morning. Con't all aggressive care. Full code.  Best Practice (right click and "Reselect all SmartList Selections" daily)   Diet/type: Regular consistency (see orders) DVT prophylaxis: prophylactic heparin  GI prophylaxis: PPI Lines: N/A Foley:  N/A Code Status:  full code Last date of multidisciplinary goals of care discussion [12/31 with husband]  Labs   CBC: Recent Labs  Lab 07/11/2021 1245 06/22/2021 1410 07/14/21 0535 07/15/21 0150  WBC 8.1  --  15.6* 13.6*  NEUTROABS 6.9  --   --   --   HGB 11.9* 12.6 13.4 10.7*  HCT 34.9* 37.0 39.0 31.2*  MCV 81.4  --  80.1 79.6*  PLT 204  --  239 162       This patient is critically ill with multiple organ system failure which requires frequent high complexity decision making, assessment, support, evaluation, and titration of therapies. This was completed through the application of advanced monitoring technologies and extensive interpretation of multiple databases. During this encounter critical care time was devoted to patient care services described in this note for 50 minutes.  Steffanie Dunn, DO 07/15/21 9:28 AM [facility]

## 2021-07-15 NOTE — Progress Notes (Signed)
Significantly worse mental status, on pressors, febrile overnight. Urinary retention requiring straight cath overnight. Concern for urosepsis. CVC placed, now requiring intubation for mental status. Husband updated and consented.  Steffanie Dunn, DO 07/15/21 8:43 AM [facility]

## 2021-07-15 NOTE — Progress Notes (Signed)
RT transported patient from 3M07 to CT and back with RN. No complications. RT will continue to monitor.

## 2021-07-15 NOTE — Progress Notes (Addendum)
Nutrition Follow-up / Consult  DOCUMENTATION CODES:   Severe malnutrition in context of chronic illness  INTERVENTION:   Begin TF via OG tube: Vital 1.5 at 25 ml/h, increase by 10 ml every 8 hours to goal rate of 45 ml/h (1080 ml per day)  Provides 1620 kcal, 73 gm protein, 825 ml free water daily  Monitor magnesium, potassium, and phosphorus BID for at least 3 days, MD to replete as needed, as pt is at risk for refeeding syndrome given baseline low phosphorus and potassium with severe malnutrition.  NUTRITION DIAGNOSIS:   Severe Malnutrition related to chronic illness (depression, anxiety, chronic pain) as evidenced by severe fat depletion, severe muscle depletion.  Ongoing  GOAL:   Patient will meet greater than or equal to 90% of their needs  Unmet  MONITOR:   PO intake, Supplement acceptance, Labs, Weight trends  REASON FOR ASSESSMENT:   Consult Enteral/tube feeding initiation and management, Assessment of nutrition requirement/status  ASSESSMENT:   75 year old female who presented to the ED on 12/29 with reports of overdose. PMH of chronic pain, GERD, depression, anxiety.  Patient required intubation this morning d/t worsening mental status and pressor requirements. Surgery was consulted d/t KUB concerning for free air. Abdomen distended and tender per Surgery note. CT scan showed no acute findings in the abdomen or pelvis.   Adult TF Protocol has been ordered with Vital High Protein at 40 ml/h with Prosource TF 45 ml BID to provide 1040 kcal, 106 gm protein, 803 ml free water daily. TF has not yet been started.   OG tube in place to LIS.  Patient is currently intubated on ventilator support MV: 8.2 L/min Temp (24hrs), Avg:100 F (37.8 C), Min:97.7 F (36.5 C), Max:103.6 F (39.8 C)   Labs reviewed. Na 133, Phos < 1; K 2.9 this morning, up to 3.9 at 10:45  Medications reviewed and include IV potassium phosphate, liquid MVI, Colace, Miralax, calcium  gluconate, mag sulfate, Levophed, Neo-synephrine, sodium bicarb IVF at 75 ml/h.  Diet Order:   Diet Order             Diet NPO time specified  Diet effective now                   EDUCATION NEEDS:   Education needs have been addressed  Skin:  Skin Assessment: Reviewed RN Assessment  Last BM:  no documented BM  Height:   Ht Readings from Last 1 Encounters:  07/15/21 5\' 4"  (1.626 m)    Weight:   Wt Readings from Last 1 Encounters:  07/14/21 50.3 kg    BMI:  Body mass index is 19.03 kg/m.  Estimated Nutritional Needs:   Kcal:  1450-1650  Protein:  70-80 gm  Fluid:  1.4-1.6 L   08/03/2021, RD, LDN, CNSC Please refer to Amion for contact information.

## 2021-07-15 NOTE — Progress Notes (Signed)
eLink Physician-Brief Progress Note Patient Name: OTHELLO SGROI DOB: 1946/03/27 MRN: [id number]   Date of Service  07/15/2021  HPI/Events of Note  Patient showing indicators of worsening delirium.  eICU Interventions  CIWA Benzodiazepines ordered to complement Precedex gtt, serial neuro-checks to continue.        Thomasene Lot Kailey Esquilin 07/15/2021, 3:04 AM

## 2021-07-15 NOTE — Progress Notes (Signed)
Periods of tachycardia in the 160s, severe tremors, and dilated pupils.

## 2021-07-15 NOTE — Consult Note (Signed)
Reason for Consult:?free air Referring Physician: Dr. Howie [name] is an 75 y.o. female.  HPI: Patient is a 75 year old female who arrived 2 days ago secondary to intentional overdose.  Patient had taken approximately 80 Norco's at home.  Patient with some constipation at home with multiple senna for bowel movements.  Patient had been recovering from her OD in ICU.  Overnight patient appeared to have decline in mental status as well as hemodynamically.  Patient got intubated.  NG tube placed and chest x-ray obtained.  There was concern for possible free air on chest x-ray.  General surgery was consulted for further evaluation.  Past Medical History:  Diagnosis Date   Anxiety    Chronic pain    Constipation    GERD (gastroesophageal reflux disease)    History of blood transfusion    Migraine    last one since 05/2015   Pneumonia 2014ish    Past Surgical History:  Procedure Laterality Date   ABDOMINAL WALL MESH  REMOVAL     APPENDECTOMY     BLADDER SURGERY     Mesh implanted   CATARACT EXTRACTION W/ INTRAOCULAR LENS  IMPLANT, BILATERAL Bilateral    COLONOSCOPY     KYPHOPLASTY N/A 09/01/2014   Procedure: KYPHOPLASTY;  Surgeon: Emilee Hero, MD;  Location: [facility] OR;  Service: Orthopedics;  Laterality: N/A;  Lumbar 1 kyphoplasty   LAMINECTOMY WITH POSTERIOR LATERAL ARTHRODESIS LEVEL 1 N/A 09/27/2015   Procedure: L3-4, 4-5 Laminectomy with posterior lateral arthrodesis;  Surgeon: Hilda Lias, MD;  Location: [facility] NEURO ORS;  Service: Neurosurgery;  Laterality: N/A;  L 4-5 Laminectomy with posterior lateral arthrodesis, partial L3 laminectomy   OOPHORECTOMY Right    SHOULDER SURGERY Left    X 2   TONSILLECTOMY      Family History  Problem Relation Age of Onset   Coronary artery disease Father 86   Coronary artery disease Mother 69    Social History:  reports that she has never smoked. She has never used smokeless tobacco. She reports that she does not drink alcohol  and does not use drugs.  Allergies:  Allergies  Allergen Reactions   Sulfa Antibiotics Hives    Medications: I have reviewed the patient's current medications.  Results for orders placed or performed during the hospital encounter of 07/18/2021 (from the past 48 hour(s))  Acetaminophen level     Status: Abnormal   Collection Time: 2021/07/18 12:45 PM  Result Value Ref Range   Acetaminophen (Tylenol), Serum 206 (HH) 10 - 30 ug/mL    Comment: CRITICAL RESULT CALLED TO, READ BACK BY AND VERIFIED WITH: DOSS,M RN @ 1350 Jul 18, 2021 [name] (NOTE) Therapeutic concentrations vary significantly. A range of 10-30 ug/mL  may be an effective concentration for many patients. However, some  are best treated at concentrations outside of this range. Acetaminophen concentrations >150 ug/mL at 4 hours after ingestion  and >50 ug/mL at 12 hours after ingestion are often associated with  toxic reactions.  Performed at [facility], [address] [place], [place] [address]   Salicylate level     Status: Abnormal   Collection Time: 2021-07-18 12:45 PM  Result Value Ref Range   Salicylate Lvl <7.0 (L) 7.0 - 30.0 mg/dL    Comment: Performed at [facility], [address], [place], [place] [address]  CBC with Differential     Status: Abnormal   Collection Time: 18-Jul-2021 12:45 PM  Result Value Ref Range   WBC  8.1 4.0 - 10.5 K/uL   RBC 4.29 3.87 - 5.11 MIL/uL   Hemoglobin 11.9 (L) 12.0 - 15.0 g/dL   HCT 16.1 (L) 09.6 - 04.5 %   MCV 81.4 80.0 - 100.0 fL   MCH 27.7 26.0 - 34.0 pg   MCHC 34.1 30.0 - 36.0 g/dL   RDW 40.9 81.1 - 91.4 %   Platelets 204 150 - 400 K/uL   nRBC 0.0 0.0 - 0.2 %   Neutrophils Relative % 85 %   Neutro Abs 6.9 1.7 - 7.7 K/uL   Lymphocytes Relative 13 %   Lymphs Abs 1.0 0.7 - 4.0 K/uL   Monocytes Relative 2 %   Monocytes Absolute 0.2 0.1 - 1.0 K/uL   Eosinophils Relative 0 %   Eosinophils Absolute 0.0 0.0 - 0.5 K/uL   Basophils Relative 0 %   Basophils Absolute  0.0 0.0 - 0.1 K/uL   Immature Granulocytes 0 %   Abs Immature Granulocytes 0.02 0.00 - 0.07 K/uL    Comment: Performed at [facility], [address] [place], [place] [address]  Comprehensive metabolic panel     Status: Abnormal   Collection Time: July 16, 2021 12:45 PM  Result Value Ref Range   Sodium 125 (L) 135 - 145 mmol/L   Potassium 2.7 (LL) 3.5 - 5.1 mmol/L    Comment: CRITICAL RESULT CALLED TO, READ BACK BY AND VERIFIED WITH: DOSS,M RN @ 1350 07/30/2021 [name]    Chloride 87 (L) 98 - 111 mmol/L   CO2 24 22 - 32 mmol/L   Glucose, Bld 111 (H) 70 - 99 mg/dL    Comment: Glucose reference range applies only to samples taken after fasting for at least 8 hours.   BUN 12 8 - 23 mg/dL   Creatinine, Ser 6.21 0.44 - 1.00 mg/dL   Calcium 9.3 8.9 - 30.8 mg/dL   Total Protein 6.5 6.5 - 8.1 g/dL   Albumin 3.6 3.5 - 5.0 g/dL   AST 24 15 - 41 U/L   ALT 15 0 - 44 U/L   Alkaline Phosphatase 82 38 - 126 U/L   Total Bilirubin 1.0 0.3 - 1.2 mg/dL   GFR, Estimated >65 >78 mL/min    Comment: (NOTE) Calculated using the CKD-EPI Creatinine Equation (2021)    Anion gap 14 5 - 15    Comment: Performed at [facility] Lab, [address] [place], [place] [address]  Ethanol     Status: None   Collection Time: 08/06/2021 12:45 PM  Result Value Ref Range   Alcohol, Ethyl (B) <10 <10 mg/dL    Comment: (NOTE) Lowest detectable limit for serum alcohol is 10 mg/dL.  For medical purposes only. Performed at [facility], [address], [place], [place] [address]   Magnesium     Status: Abnormal   Collection Time: 16-Jul-2021 12:45 PM  Result Value Ref Range   Magnesium 1.4 (L) 1.7 - 2.4 mg/dL    Comment: Performed at [facility], [address] [place], [place] [address]  Resp Panel by RT-PCR (Flu A&B, Covid) Nasopharyngeal Swab     Status: None   Collection Time: 16-Jul-2021 12:45 PM   Specimen: Nasopharyngeal Swab; Nasopharyngeal(NP) swabs in vial transport medium  Result Value  Ref Range   SARS Coronavirus 2 by RT PCR NEGATIVE NEGATIVE    Comment: (NOTE) SARS-CoV-2 target nucleic acids are NOT DETECTED.  The SARS-CoV-2 RNA is generally detectable in upper respiratory specimens during the acute  phase of infection. The lowest concentration of SARS-CoV-2 viral copies this assay can detect is 138 copies/mL. A negative result does not preclude SARS-Cov-2 infection and should not be used as the sole basis for treatment or other patient management decisions. A negative result may occur with  improper specimen collection/handling, submission of specimen other than nasopharyngeal swab, presence of viral mutation(s) within the areas targeted by this assay, and inadequate number of viral copies(<138 copies/mL). A negative result must be combined with clinical observations, patient history, and epidemiological information. The expected result is Negative.  Fact Sheet for Patients:  BloggerCourse.com  Fact Sheet for Healthcare Providers:  SeriousBroker.it  This test is no t yet approved or cleared by the [place] FDA and  has been authorized for detection and/or diagnosis of SARS-CoV-2 by FDA under an Emergency Use Authorization (EUA). This EUA will remain  in effect (meaning this test can be used) for the duration of the COVID-19 declaration under Section 564(b)(1) of the Act, 21 U.S.C.section 360bbb-3(b)(1), unless the authorization is terminated  or revoked sooner.       Influenza A by PCR NEGATIVE NEGATIVE   Influenza B by PCR NEGATIVE NEGATIVE    Comment: (NOTE) The Xpert Xpress SARS-CoV-2/FLU/RSV plus assay is intended as an aid in the diagnosis of influenza from Nasopharyngeal swab specimens and should not be used as a sole basis for treatment. Nasal washings and aspirates are unacceptable for Xpert Xpress SARS-CoV-2/FLU/RSV testing.  Fact Sheet for  Patients: BloggerCourse.com  Fact Sheet for Healthcare Providers: SeriousBroker.it  This test is not yet approved or cleared by the [place] FDA and has been authorized for detection and/or diagnosis of SARS-CoV-2 by FDA under an Emergency Use Authorization (EUA). This EUA will remain in effect (meaning this test can be used) for the duration of the COVID-19 declaration under Section 564(b)(1) of the Act, 21 U.S.C. section 360bbb-3(b)(1), unless the authorization is terminated or revoked.  Performed at [facility] Lab, [address] [place], [place] [address]   I-Stat venous blood gas, [facility] ED)     Status: Abnormal   Collection Time: 14-Jul-2021  2:10 PM  Result Value Ref Range   pH, Ven 7.500 (H) 7.250 - 7.430   pCO2, Ven 34.6 (L) 44.0 - 60.0 mmHg   pO2, Ven 144.0 (H) 32.0 - 45.0 mmHg   Bicarbonate 27.0 20.0 - 28.0 mmol/L   TCO2 28 22 - 32 mmol/L   O2 Saturation 99.0 %   Acid-Base Excess 4.0 (H) 0.0 - 2.0 mmol/L   Sodium 126 (L) 135 - 145 mmol/L   Potassium 2.7 (LL) 3.5 - 5.1 mmol/L   Calcium, Ion 1.13 (L) 1.15 - 1.40 mmol/L   HCT 37.0 36.0 - 46.0 %   Hemoglobin 12.6 12.0 - 15.0 g/dL   Sample type VENOUS    Comment NOTIFIED PHYSICIAN   Acetaminophen level     Status: Abnormal   Collection Time: July 14, 2021  4:00 PM  Result Value Ref Range   Acetaminophen (Tylenol), Serum 90 (H) 10 - 30 ug/mL    Comment: (NOTE) Therapeutic concentrations vary significantly. A range of 10-30 ug/mL  may be an effective concentration for many patients. However, some  are best treated at concentrations outside of this range. Acetaminophen concentrations >150 ug/mL at 4 hours after ingestion  and >50 ug/mL at 12 hours after ingestion are often associated with  toxic reactions.  Performed at [facility], [address] [place], [place] [address]   MRSA Next  Gen by PCR, Nasal     Status: None   Collection Time: 06/30/2021  9:00 PM    Specimen: Nasal Mucosa; Nasal Swab  Result Value Ref Range   MRSA by PCR Next Gen NOT DETECTED NOT DETECTED    Comment: (NOTE) The GeneXpert MRSA Assay (FDA approved for NASAL specimens only), is one component of a comprehensive MRSA colonization surveillance program. It is not intended to diagnose MRSA infection nor to guide or monitor treatment for MRSA infections. Test performance is not FDA approved in patients less than 53 years old. Performed at [facility], [address] [place], [place] [address]   Glucose, capillary     Status: Abnormal   Collection Time: 06/29/2021  9:02 PM  Result Value Ref Range   Glucose-Capillary 121 (H) 70 - 99 mg/dL    Comment: Glucose reference range applies only to samples taken after fasting for at least 8 hours.  Acetaminophen level     Status: None   Collection Time: 07/01/2021  9:43 PM  Result Value Ref Range   Acetaminophen (Tylenol), Serum 25 10 - 30 ug/mL    Comment: (NOTE) Therapeutic concentrations vary significantly. A range of 10-30 ug/mL  may be an effective concentration for many patients. However, some  are best treated at concentrations outside of this range. Acetaminophen concentrations >150 ug/mL at 4 hours after ingestion  and >50 ug/mL at 12 hours after ingestion are often associated with  toxic reactions.  Performed at [facility], [address], [place], [place] [address]   Hepatic function panel     Status: Abnormal   Collection Time: 06/26/2021  9:43 PM  Result Value Ref Range   Total Protein 6.8 6.5 - 8.1 g/dL   Albumin 3.3 (L) 3.5 - 5.0 g/dL   AST 26 15 - 41 U/L   ALT 17 0 - 44 U/L   Alkaline Phosphatase 69 38 - 126 U/L   Total Bilirubin 1.4 (H) 0.3 - 1.2 mg/dL   Bilirubin, Direct 0.3 (H) 0.0 - 0.2 mg/dL   Indirect Bilirubin 1.1 (H) 0.3 - 0.9 mg/dL    Comment: Performed at [facility], [address], [place], [place] [address]  Potassium     Status: None   Collection Time: 06/26/2021  9:43 PM   Result Value Ref Range   Potassium 3.7 3.5 - 5.1 mmol/L    Comment: DELTA CHECK NOTED Performed at [facility], [address], [place], [place] [address]   Glucose, capillary     Status: Abnormal   Collection Time: 06/23/2021 11:37 PM  Result Value Ref Range   Glucose-Capillary 134 (H) 70 - 99 mg/dL    Comment: Glucose reference range applies only to samples taken after fasting for at least 8 hours.  Acetaminophen level     Status: None   Collection Time: 07/14/21  1:41 AM  Result Value Ref Range   Acetaminophen (Tylenol), Serum 16 10 - 30 ug/mL    Comment: (NOTE) Therapeutic concentrations vary significantly. A range of 10-30 ug/mL  may be an effective concentration for many patients. However, some  are best treated at concentrations outside of this range. Acetaminophen concentrations >150 ug/mL at 4 hours after ingestion  and >50 ug/mL at 12 hours after ingestion are often associated with  toxic reactions.  Performed at [facility], [address], [place], [place] [address]   Hepatic function panel     Status: Abnormal  Collection Time: 07/14/21  1:41 AM  Result Value Ref Range   Total Protein 6.3 (L) 6.5 - 8.1 g/dL   Albumin 3.3 (L) 3.5 - 5.0 g/dL   AST 25 15 - 41 U/L   ALT 16 0 - 44 U/L   Alkaline Phosphatase 74 38 - 126 U/L   Total Bilirubin 1.3 (H) 0.3 - 1.2 mg/dL   Bilirubin, Direct 0.2 0.0 - 0.2 mg/dL   Indirect Bilirubin 1.1 (H) 0.3 - 0.9 mg/dL    Comment: Performed at [facility], [address] [place], [place] [address]  Glucose, capillary     Status: Abnormal   Collection Time: 07/14/21  3:57 AM  Result Value Ref Range   Glucose-Capillary 121 (H) 70 - 99 mg/dL    Comment: Glucose reference range applies only to samples taken after fasting for at least 8 hours.  Comprehensive metabolic panel     Status: Abnormal   Collection Time: 07/14/21  5:35 AM  Result Value Ref Range   Sodium 128 (L) 135 - 145 mmol/L   Potassium 3.0 (L) 3.5 - 5.1  mmol/L   Chloride 96 (L) 98 - 111 mmol/L   CO2 19 (L) 22 - 32 mmol/L   Glucose, Bld 117 (H) 70 - 99 mg/dL    Comment: Glucose reference range applies only to samples taken after fasting for at least 8 hours.   BUN 9 8 - 23 mg/dL   Creatinine, Ser 6.04 0.44 - 1.00 mg/dL   Calcium 9.0 8.9 - 54.0 mg/dL   Total Protein 6.7 6.5 - 8.1 g/dL   Albumin 3.2 (L) 3.5 - 5.0 g/dL   AST 27 15 - 41 U/L   ALT 14 0 - 44 U/L   Alkaline Phosphatase 80 38 - 126 U/L   Total Bilirubin 1.1 0.3 - 1.2 mg/dL   GFR, Estimated >98 >11 mL/min    Comment: (NOTE) Calculated using the CKD-EPI Creatinine Equation (2021)    Anion gap 13 5 - 15    Comment: Performed at [facility], [address], [place], [place] [address]  Protime-INR     Status: Abnormal   Collection Time: 07/14/21  5:35 AM  Result Value Ref Range   Prothrombin Time 15.3 (H) 11.4 - 15.2 seconds   INR 1.2 0.8 - 1.2    Comment: (NOTE) INR goal varies based on device and disease states. Performed at [facility], [address] [place], [place] [address]   CBC     Status: Abnormal   Collection Time: 07/14/21  5:35 AM  Result Value Ref Range   WBC 15.6 (H) 4.0 - 10.5 K/uL   RBC 4.87 3.87 - 5.11 MIL/uL   Hemoglobin 13.4 12.0 - 15.0 g/dL   HCT 13.0 86.5 - 78.4 %   MCV 80.1 80.0 - 100.0 fL   MCH 27.5 26.0 - 34.0 pg   MCHC 34.4 30.0 - 36.0 g/dL   RDW 69.6 (H) 29.5 - 28.4 %   Platelets 239 150 - 400 K/uL   nRBC 0.0 0.0 - 0.2 %    Comment: Performed at [facility] Lab, [address], [place], [place] [address]  Acetaminophen level     Status: None   Collection Time: 07/14/21  5:35 AM  Result Value Ref Range   Acetaminophen (Tylenol), Serum 10 10 - 30 ug/mL    Comment: (NOTE) Therapeutic concentrations vary significantly. A range of 10-30 ug/mL  may be an effective concentration  for many patients. However, some  are best treated at concentrations outside of this range. Acetaminophen concentrations >150 ug/mL at 4 hours  after ingestion  and >50 ug/mL at 12 hours after ingestion are often associated with  toxic reactions.  Performed at [facility], [address], [place], [place] [address]   Bilirubin, direct     Status: None   Collection Time: 07/14/21  5:35 AM  Result Value Ref Range   Bilirubin, Direct 0.1 0.0 - 0.2 mg/dL    Comment: Performed at [facility], [address], [place], [place] [address]  Glucose, capillary     Status: Abnormal   Collection Time: 07/14/21  7:22 AM  Result Value Ref Range   Glucose-Capillary 119 (H) 70 - 99 mg/dL    Comment: Glucose reference range applies only to samples taken after fasting for at least 8 hours.  Acetaminophen level     Status: Abnormal   Collection Time: 07/14/21  9:43 AM  Result Value Ref Range   Acetaminophen (Tylenol), Serum <10 (L) 10 - 30 ug/mL    Comment: (NOTE) Therapeutic concentrations vary significantly. A range of 10-30 ug/mL  may be an effective concentration for many patients. However, some  are best treated at concentrations outside of this range. Acetaminophen concentrations >150 ug/mL at 4 hours after ingestion  and >50 ug/mL at 12 hours after ingestion are often associated with  toxic reactions.  Performed at [facility], [address] [place], [place] [address]   Hepatic function panel     Status: Abnormal   Collection Time: 07/14/21  9:43 AM  Result Value Ref Range   Total Protein 6.7 6.5 - 8.1 g/dL   Albumin 3.0 (L) 3.5 - 5.0 g/dL   AST 29 15 - 41 U/L   ALT 15 0 - 44 U/L   Alkaline Phosphatase 75 38 - 126 U/L   Total Bilirubin 0.8 0.3 - 1.2 mg/dL   Bilirubin, Direct 0.1 0.0 - 0.2 mg/dL   Indirect Bilirubin 0.7 0.3 - 0.9 mg/dL    Comment: Performed at [facility], [address] [place], [place] [address]  Glucose, capillary     Status: Abnormal   Collection Time: 07/14/21 11:14 AM  Result Value Ref Range   Glucose-Capillary 121 (H) 70 - 99 mg/dL    Comment: Glucose reference range  applies only to samples taken after fasting for at least 8 hours.  Glucose, capillary     Status: Abnormal   Collection Time: 07/14/21  3:17 PM  Result Value Ref Range   Glucose-Capillary 149 (H) 70 - 99 mg/dL    Comment: Glucose reference range applies only to samples taken after fasting for at least 8 hours.  Comprehensive metabolic panel     Status: Abnormal   Collection Time: 07/14/21  5:48 PM  Result Value Ref Range   Sodium 129 (L) 135 - 145 mmol/L   Potassium 3.9 3.5 - 5.1 mmol/L    Comment: DELTA CHECK NOTED   Chloride 101 98 - 111 mmol/L   CO2 16 (L) 22 - 32 mmol/L   Glucose, Bld 147 (H) 70 - 99 mg/dL    Comment: Glucose reference range applies only to samples taken after fasting for at least 8 hours.   BUN 17 8 - 23 mg/dL   Creatinine, Ser 9.76 (H) 0.44 - 1.00 mg/dL   Calcium 8.7 (L) 8.9 - 10.3 mg/dL   Total Protein 6.2 (L) 6.5 -  8.1 g/dL   Albumin 2.8 (L) 3.5 - 5.0 g/dL   AST 29 15 - 41 U/L   ALT 14 0 - 44 U/L   Alkaline Phosphatase 72 38 - 126 U/L   Total Bilirubin 0.6 0.3 - 1.2 mg/dL   GFR, Estimated 44 (L) >60 mL/min    Comment: (NOTE) Calculated using the CKD-EPI Creatinine Equation (2021)    Anion gap 12 5 - 15    Comment: Performed at [facility] Lab, [address], [place], [place] [address]  Glucose, capillary     Status: Abnormal   Collection Time: 07/14/21  7:38 PM  Result Value Ref Range   Glucose-Capillary 164 (H) 70 - 99 mg/dL    Comment: Glucose reference range applies only to samples taken after fasting for at least 8 hours.  Brain natriuretic peptide     Status: Abnormal   Collection Time: 07/14/21  9:00 PM  Result Value Ref Range   B Natriuretic Peptide 1,888.5 (H) 0.0 - 100.0 pg/mL    Comment: Performed at [facility], [address], [place], [place] [address]  Glucose, capillary     Status: Abnormal   Collection Time: 07/14/21 11:22 PM  Result Value Ref Range   Glucose-Capillary 106 (H) 70 - 99 mg/dL    Comment: Glucose reference  range applies only to samples taken after fasting for at least 8 hours.  CBC     Status: Abnormal   Collection Time: 07/15/21  1:50 AM  Result Value Ref Range   WBC 13.6 (H) 4.0 - 10.5 K/uL   RBC 3.92 3.87 - 5.11 MIL/uL   Hemoglobin 10.7 (L) 12.0 - 15.0 g/dL   HCT 23.7 (L) 62.8 - 31.5 %   MCV 79.6 (L) 80.0 - 100.0 fL   MCH 27.3 26.0 - 34.0 pg   MCHC 34.3 30.0 - 36.0 g/dL   RDW 17.6 (H) 16.0 - 73.7 %   Platelets 162 150 - 400 K/uL   nRBC 0.0 0.0 - 0.2 %    Comment: Performed at [facility] Lab, [address], [place], [place] [address]  Magnesium     Status: None   Collection Time: 07/15/21  1:50 AM  Result Value Ref Range   Magnesium 1.8 1.7 - 2.4 mg/dL    Comment: Performed at [facility] Lab, [address] [place], [place] [address]  Phosphorus     Status: Abnormal   Collection Time: 07/15/21  1:50 AM  Result Value Ref Range   Phosphorus <1.0 (LL) 2.5 - 4.6 mg/dL    Comment: CRITICAL RESULT CALLED TO, READ BACK BY AND VERIFIED WITH: Margie Ege 07/15/21 0317 WAYK Performed at [facility], [address] [place], [place] [address]   Comprehensive metabolic panel     Status: Abnormal   Collection Time: 07/15/21  1:50 AM  Result Value Ref Range   Sodium 127 (L) 135 - 145 mmol/L   Potassium 2.9 (L) 3.5 - 5.1 mmol/L   Chloride 100 98 - 111 mmol/L   CO2 15 (L) 22 - 32 mmol/L   Glucose, Bld 150 (H) 70 - 99 mg/dL    Comment: Glucose reference range applies only to samples taken after fasting for at least 8 hours.   BUN 31 (H) 8 - 23 mg/dL   Creatinine, Ser 5.00 (H) 0.44 - 1.00 mg/dL   Calcium 8.2 (L) 8.9 - 10.3 mg/dL   Total Protein 5.2 (L) 6.5 - 8.1 g/dL  Albumin 2.4 (L) 3.5 - 5.0 g/dL   AST 28 15 - 41 U/L   ALT 13 0 - 44 U/L   Alkaline Phosphatase 82 38 - 126 U/L   Total Bilirubin 0.4 0.3 - 1.2 mg/dL   GFR, Estimated 25 (L) >60 mL/min    Comment: (NOTE) Calculated using the CKD-EPI Creatinine Equation (2021)    Anion gap 12 5 - 15    Comment:  Performed at [facility] Lab, [address] [place], [place] [address]  Glucose, capillary     Status: Abnormal   Collection Time: 07/15/21  3:19 AM  Result Value Ref Range   Glucose-Capillary 147 (H) 70 - 99 mg/dL    Comment: Glucose reference range applies only to samples taken after fasting for at least 8 hours.  Glucose, capillary     Status: Abnormal   Collection Time: 07/15/21  7:08 AM  Result Value Ref Range   Glucose-Capillary 131 (H) 70 - 99 mg/dL    Comment: Glucose reference range applies only to samples taken after fasting for at least 8 hours.    DG Abd 1 View  Addendum Date: 07/15/2021   ADDENDUM REPORT: 07/15/2021 09:59 ADDENDUM: Critical Value/emergent results were called by telephone at the time of interpretation on 07/15/2021 at 9:59 am to provider Karie Fetch , who verbally acknowledged these results. Electronically Signed   By: Signa Kell M.D.   On: 07/15/2021 09:59   Result Date: 07/15/2021 CLINICAL DATA:  Evaluate OG tube placement. EXAM: ABDOMEN - 1 VIEW COMPARISON:  09/26/2020. FINDINGS: Enteric tube is been placed with tip and side port well below the GE junction. The tip of the tube projects over the distal stomach/antrum. There is diffuse lucency within the upper abdomen for which pneumoperitoneum cannot be excluded. Diffuse bilateral pulmonary infiltrates are identified within the visualized portions of the lungs. IMPRESSION: 1. Enteric tube tip projects over the distal stomach/antrum. 2. Diffuse lucency within the upper abdomen for which pneumoperitoneum cannot be excluded. Recommend further evaluation with right lateral decubitus or cross-table lateral radiograph. 3. Diffuse bilateral pulmonary infiltrates. Electronically Signed: By: Signa Kell M.D. On: 07/15/2021 09:48   DG CHEST PORT 1 VIEW  Result Date: 07/15/2021 CLINICAL DATA:  75 year old female status post endotracheal tube placement. EXAM: PORTABLE CHEST 1 VIEW COMPARISON:  Chest x-ray  09/26/2020. FINDINGS: An endotracheal tube is in place with tip 1.4 cm above the carina. A nasogastric tube is seen extending into the stomach, however, the tip of the nasogastric tube extends below the lower margin of the image. Right internal jugular central venous catheter with tip terminating in the right atrium. Lung volumes are low. Patchy areas of interstitial prominence an [name]-defined airspace disease are noted throughout the lungs bilaterally, most severe throughout the mid to lower lungs. No definite pleural effusions. No pneumothorax. Pulmonary vasculature is obscured. Heart size is normal. Upper mediastinal contours are within normal limits. Atherosclerotic calcifications in the thoracic aorta. IMPRESSION: 1. Support apparatus, as above. 2. The appearance the chest suggests severe multilobar bilateral pneumonia, as above. 3. Aortic atherosclerosis. Electronically Signed   By: Trudie Reed M.D.   On: 07/15/2021 09:44    Review of Systems  Constitutional:  Negative for chills and fever.  HENT:  Negative for ear discharge, hearing loss and sore throat.   Eyes:  Negative for discharge.  Respiratory:  Negative for cough and shortness of breath.   Cardiovascular:  Negative for chest pain and leg swelling.  Gastrointestinal:  Negative for  abdominal pain, constipation, diarrhea, nausea and vomiting.  Musculoskeletal:  Negative for myalgias and neck pain.  Skin:  Negative for rash.  Allergic/Immunologic: Negative for environmental allergies.  Neurological:  Negative for dizziness and seizures.  Hematological:  Does not bruise/bleed easily.  Psychiatric/Behavioral:  Positive for suicidal ideas.   All other systems reviewed and are negative. Blood pressure (!) 114/92, pulse (!) 118, temperature 99.3 F (37.4 C), temperature source Axillary, resp. rate 19, height 5\' 4"  (1.626 m), weight 50.3 kg, SpO2 100 %. Physical Exam Constitutional:      Appearance: She is well-developed.     Comments:  Conversant No acute distress  HENT:     Head: Normocephalic and atraumatic.  Eyes:     General: Lids are normal. No scleral icterus.    Pupils: Pupils are equal, round, and reactive to light.     Comments: Pupils are equal round and reactive No lid lag Moist conjunctiva  Neck:     Thyroid: No thyromegaly.     Trachea: No tracheal tenderness.     Comments: No cervical lymphadenopathy Cardiovascular:     Rate and Rhythm: Normal rate and regular rhythm.     Heart sounds: No murmur heard. Pulmonary:     Effort: Pulmonary effort is normal.     Breath sounds: Normal breath sounds. No wheezing or rales.  Abdominal:     General: There is distension.     Tenderness: There is abdominal tenderness. There is no guarding or rebound.     Hernia: No hernia is present.  Musculoskeletal:     Cervical back: Normal range of motion and neck supple.  Skin:    General: Skin is warm.     Findings: No rash.     Nails: There is no clubbing.     Comments: Normal skin turgor  Neurological:     Mental Status: She is alert and oriented to person, place, and time.     Comments: Normal gait and station  Psychiatric:        Mood and Affect: Mood normal.        Thought Content: Thought content normal.        Judgment: Judgment normal.     Comments: Appropriate affect    Assessment/Plan: 75 year old female status post overdose ? free air.  1.  Agree with continuing with CT scan at this time.  Patient appears to have a history of chronic constipation.  KUB/x-ray  Could be secondary to large colon overlying the liver. 2.  We will follow-up CT scan, if free air is seen will discuss with husband in regards to surgery. 3.  We will follow along.  73 07/15/2021, 10:49 AM

## 2021-07-15 NOTE — Progress Notes (Signed)
RT obtained ABG 1 hour post intubation.  ABG as follows: 7.29/41.1/339/20 RT decreased FiO2 from 50%. No new orders for RT at this time. RT will continue to monitor.

## 2021-07-15 NOTE — Progress Notes (Signed)
eLink Physician-Brief Progress Note Patient Name: [name] DOB: [birth year] MRN: [id number]   Date of Service  07/15/2021  HPI/Events of Note  BP 82/49, MAP 61, HR 110, BNP 1888. Precedex off.  eICU Interventions  Peripheral gtt ordered.        Thomasene Lot Haitham Dolinsky 07/15/2021, 5:46 AM

## 2021-07-15 NOTE — Progress Notes (Signed)
OGT pulled back 15 cm per Dr. Chestine Spore with CCM.

## 2021-07-15 NOTE — Procedures (Signed)
Intubation Procedure Note  [name]  [id number]  07/21/1945  Date:07/15/21  Time:9:04 AM   Provider Performing:Garrin Kirwan P Chestine Spore    Procedure: Intubation (31500)  Indication(s) Respiratory Failure  Consent Risks of the procedure as well as the alternatives and risks of each were explained to the patient and/or caregiver.  Consent for the procedure was obtained and is signed in the bedside chart   Anesthesia Fentanyl and Rocuronium   Time Out Verified patient identification, verified procedure, site/side was marked, verified correct patient position, special equipment/implants available, medications/allergies/relevant history reviewed, required imaging and test results available.   Sterile Technique Usual hand hygeine, masks, and gloves were used   Procedure Description Patient positioned in bed supine.  Sedation given as noted above.  Patient was intubated with endotracheal tube using Glidescope.  View was Grade 1 full glottis .  Number of attempts was 1.  Colorimetric CO2 detector was consistent with tracheal placement.   Complications/Tolerance None; patient tolerated the procedure well. Chest X-ray is ordered to verify placement.   EBL Minimal   Specimen(s) None  Steffanie Dunn, DO 07/15/21 9:04 AM [facility]

## 2021-07-15 NOTE — Procedures (Signed)
Central Venous Catheter Insertion Procedure Note  [name]  [id number]  03/03/1946  Date:07/15/21  Time:8:43 AM   Provider Performing:Meckenzie Balsley Demetrius Charity Chestine Spore   Procedure: Insertion of Non-tunneled Central Venous [phone]) with US guidance (02542)   Indication(s) Medication administration  Consent Risks of the procedure as well as the alternatives and risks of each were explained to the patient and/or caregiver.  Consent for the procedure was obtained and is signed in the bedside chart  Anesthesia Topical only with 1% lidocaine   Timeout Verified patient identification, verified procedure, site/side was marked, verified correct patient position, special equipment/implants available, medications/allergies/relevant history reviewed, required imaging and test results available.  Sterile Technique Maximal sterile technique including full sterile barrier drape, hand hygiene, sterile gown, sterile gloves, mask, hair covering, sterile ultrasound probe cover (if used).  Procedure Description Area of catheter insertion was cleaned with chlorhexidine and draped in sterile fashion.  With real-time ultrasound guidance a central venous catheter was placed into the right internal jugular vein. Nonpulsatile blood flow and easy flushing noted in all ports.  The catheter was sutured in place and sterile dressing applied.  Complications/Tolerance None; patient tolerated the procedure well. Chest X-ray is ordered to verify placement for internal jugular or subclavian cannulation.   Chest x-ray is not ordered for femoral cannulation.  EBL Minimal  Specimen(s) None  1 site of blood cultures drawn during insertion due to need to obtain them quickly from rapidly deteriorating clinical condition and antibiotics running.  Steffanie Dunn, DO 07/15/21 8:44 AM [facility]

## 2021-07-15 NOTE — Progress Notes (Signed)
An USGPIV (ultrasound guided PIV) has been placed for short-term vasopressor infusion. A correctly placed ivWatch must be used when administering Vasopressors. Should this treatment be needed beyond 72 hours, central line access should be obtained.  It will be the responsibility of the bedside nurse to follow best practice to prevent extravasations.   ?

## 2021-07-15 NOTE — Progress Notes (Signed)
eLink Physician-Brief Progress Note Patient Name: SHAKYLA NOLLEY DOB: June 06, 1946 MRN: [id number]   Date of Service  07/15/2021  HPI/Events of Note  K+ 2.9, PHOS < 1  eICU Interventions  K+ PHOS replacement ordered per E-link electrolyte replacement protocol.        Thomasene Lot Claborn Janusz 07/15/2021, 3:37 AM

## 2021-07-15 NOTE — Progress Notes (Addendum)
eLink Physician-Brief Progress Note Patient Name: [name] DOB: [birth year] MRN: [id number]   Date of Service  07/15/2021  HPI/Events of Note  CBG trending up and just started tube feeds. No insulin coverage.   eICU Interventions  Ssi ordered     Intervention Category Intermediate Interventions: Hyperglycemia - evaluation and treatment;Best-practice therapies (e.g. DVT, beta blocker, etc.)  Ranee Gosselin 07/15/2021, 11:42 PM  04:20  Camera: Discussed with bed side RN.  Was completely alert until prn fenta total 100. Marland Kitchen Then got total 250 fenta in this shift.  Not responding to commands. No seizures. In synchrony with vent. VS stable. MAP 65. Sternal rub-no response. Eyes: reactive. Mottled lower extremities. Hypotensive.  Levo at 10 re started.  HR 119, sats 97% No seizure activities.  On SSI, got 5 units earlier. On TF. Glucose: 151.  - Narcan stat. Head flat from 30 degree.did not help consciousness. Stat ABG and LR 500 ml ordered.titrate levophed gtt up. - hold TF. Stat ABG.  - CT head stat once MAP stable.  Started to posturing, moving arms.  Narcan 0.4 again once.   6:11 Code blue called. Just came down from CT scan. ACLS protocol, prolonged for over 25 mins with multiple V fib shocks/bicarbs/amiodarone. D50 for low sugar.   Radiologist called in during code- air in aorta.   CT abdomen- perf stomach, air in central arteries. CCM doc involved.  Unable to get ROSC.

## 2021-07-15 NOTE — Progress Notes (Incomplete)
°  Echocardiogram 2D Echocardiogram has been performed.  [name] F 07/15/2021, 10:57 AM

## 2021-07-16 ENCOUNTER — Inpatient Hospital Stay (HOSPITAL_COMMUNITY): Payer: Medicare Other

## 2021-07-16 LAB — BASIC METABOLIC PANEL
Anion gap: 12 (ref 5–15)
BUN: 25 mg/dL — ABNORMAL HIGH (ref 8–23)
CO2: 22 mmol/L (ref 22–32)
Calcium: 8.9 mg/dL (ref 8.9–10.3)
Chloride: 104 mmol/L (ref 98–111)
Creatinine, Ser: 1.1 mg/dL — ABNORMAL HIGH (ref 0.44–1.00)
GFR, Estimated: 52 mL/min — ABNORMAL LOW (ref >60)
Glucose, Bld: 308 mg/dL — ABNORMAL HIGH (ref 70–99)
Potassium: 4.3 mmol/L (ref 3.5–5.1)
Sodium: 138 mmol/L (ref 135–145)

## 2021-07-16 LAB — POCT I-STAT 7, (LYTES, BLD GAS, ICA,H+H)
Acid-base deficit: 4 mmol/L — ABNORMAL HIGH (ref 0.0–2.0)
Bicarbonate: 18.2 mmol/L — ABNORMAL LOW (ref 20.0–28.0)
Calcium, Ion: 1.17 mmol/L (ref 1.15–1.40)
HCT: 32 % — ABNORMAL LOW (ref 36.0–46.0)
Hemoglobin: 10.9 g/dL — ABNORMAL LOW (ref 12.0–15.0)
O2 Saturation: 99 %
Patient temperature: 98.6
Potassium: 4.3 mmol/L (ref 3.5–5.1)
Sodium: 139 mmol/L (ref 135–145)
TCO2: 19 mmol/L — ABNORMAL LOW (ref 22–32)
pCO2 arterial: 23.5 mmHg — ABNORMAL LOW (ref 32.0–48.0)
pH, Arterial: 7.497 — ABNORMAL HIGH (ref 7.350–7.450)
pO2, Arterial: 124 mmHg — ABNORMAL HIGH (ref 83.0–108.0)

## 2021-07-16 LAB — CBC
HCT: 35.7 % — ABNORMAL LOW (ref 36.0–46.0)
Hemoglobin: 11.6 g/dL — ABNORMAL LOW (ref 12.0–15.0)
MCH: 27.5 pg (ref 26.0–34.0)
MCHC: 32.5 g/dL (ref 30.0–36.0)
MCV: 84.6 fL (ref 80.0–100.0)
Platelets: 142 10*3/uL — ABNORMAL LOW (ref 150–400)
RBC: 4.22 MIL/uL (ref 3.87–5.11)
RDW: 17.2 % — ABNORMAL HIGH (ref 11.5–15.5)
WBC: 14.7 10*3/uL — ABNORMAL HIGH (ref 4.0–10.5)
nRBC: 0 % (ref 0.0–0.2)

## 2021-07-16 LAB — PHOSPHORUS
Phosphorus: 3.4 mg/dL (ref 2.5–4.6)
Phosphorus: 3.3 mg/dL (ref 2.5–4.6)

## 2021-07-16 LAB — MAGNESIUM
Magnesium: 8.2 mg/dL (ref 1.7–2.4)
Magnesium: 6.9 mg/dL (ref 1.7–2.4)

## 2021-07-16 LAB — LACTIC ACID, PLASMA: Lactic Acid, Venous: >9 mmol/L (ref 0.5–1.9)

## 2021-07-16 LAB — GLUCOSE, CAPILLARY
Glucose-Capillary: 278 mg/dL — ABNORMAL HIGH (ref 70–99)
Glucose-Capillary: 151 mg/dL — ABNORMAL HIGH (ref 70–99)
Glucose-Capillary: 34 mg/dL — CL (ref 70–99)

## 2021-07-16 MED ORDER — NALOXONE HCL 0.4 MG/ML IJ SOLN: 0.4000 mg | Freq: Once | INTRAMUSCULAR | Status: DC

## 2021-07-16 MED ORDER — NALOXONE HCL 0.4 MG/ML IJ SOLN
0.4000 mg | INTRAMUSCULAR | Status: DC | PRN
  Administered 2021-07-16: 0.4 mg via INTRAVENOUS
  Filled 2021-07-16: qty 1

## 2021-07-16 MED ORDER — LACTATED RINGERS IV BOLUS: 500.0000 mL | Freq: Once | INTRAVENOUS | Status: DC

## 2021-07-16 MED ORDER — NALOXONE HCL 0.4 MG/ML IJ SOLN
INTRAMUSCULAR | Status: AC
  Administered 2021-07-16: 0.4 mg
  Filled 2021-07-16: qty 1

## 2021-07-16 DEATH — deceased

## 2021-07-17 LAB — URINE CULTURE: Culture: 30000 — AB

## 2021-07-20 LAB — CULTURE, BLOOD (ROUTINE X 2)
Culture: NO GROWTH
Culture: NO GROWTH
Special Requests: ADEQUATE
Special Requests: ADEQUATE

## 2021-07-28 MED FILL — Medication: Qty: 1 | Status: AC

## 2021-08-16 NOTE — Progress Notes (Signed)
Pt transported by RT and RN x2 from 28m07 to CT and back w/no complications.

## 2021-08-16 NOTE — Progress Notes (Signed)
Chaplain responded to Code Blue.  No family present, pt not available.  Please contact if support is needed.    Belia Heman, [place] Pager:  [phone]    07/23/2021 0500  Clinical Encounter Type  Visited With Patient not available  Visit Type Initial;Code  Referral From Nurse  Consult/Referral To Chaplain  Stress Factors  Patient Stress Factors Health changes

## 2021-08-16 NOTE — Code Documentation (Signed)
°  Patient Name: [name]   MRN: [id number]   Date of Birth/ Sex: Sep 03, 1945 , female      Admission Date: 08-05-2021  Attending Provider: Steffanie Dunn, DO  Primary Diagnosis: Hypokalemia [E87.6] Hypomagnesemia [E83.42] Suicide attempt [facility]) [T14.91XA] Respiratory distress [R06.03] Acute respiratory failure with hypoxia (HCC) [J96.01] Acetaminophen overdose of undetermined intent, initial encounter [T39.1X4A] Opioid overdose, undetermined intent, initial encounter (HCC) [T40.2X4A] Intentional overdose (HCC) [T50.902A]   Indication: Pt was in her usual state of health until this PM, when she was noted to have a change in mental status and firm abdomen prompting patient to get CT head and CT abdomen pelvis. On the way back to the ICU she underwent PEA cardiac arrest. CT A/P results were noted to show diffuse pneumatosis of the colon, perforation of the stomach, and diffuse gas filling of the aorta and iliacs. Code blue was subsequently called. At the time of arrival on scene, ACLS protocol was underway.   Technical Description:  - CPR performance duration:  28 minutes  - Was defibrillation or cardioversion used? Yes   - Was external pacer placed? No  - Was patient intubated pre/post CPR? Yes   Medications Administered: Y = Yes; Blank = No Amiodarone    Atropine    Calcium  Y  Epinephrine  Y  Lidocaine    Magnesium    Norepinephrine  Y  Phenylephrine    Sodium bicarbonate  Y  Vasopressin    Other    Post CPR evaluation:  - Final Status - Was patient successfully resuscitated ? No   Miscellaneous Information:  - Time of death:  0609 AM  - Primary team notified?  Yes  - Family Notified? Yes     Marolyn Haller, MD   08/12/2021, 6:18 AM

## 2021-08-16 NOTE — Death Summary Note (Signed)
DEATH SUMMARY   Patient Details  Name: [name] MRN: [id number] DOB: Mar 18, 1946  Admission/Discharge Information   Admit Date:  2021/07/20  Date of Death: Date of Death: 2021-07-23  Time of Death: Time of Death: 0609  Length of Stay: 3  Referring Physician: Karleen Hampshire., MD   Reason(s) for Hospitalization  Intentional overdose  Diagnoses  Preliminary cause of death: perforated viscus Secondary Diagnoses (including complications and co-morbidities):  Principal Problem:   Intentional overdose ([place]) Active Problems:   Protein-calorie malnutrition, severe  Opiate overdose, intentional Tylenol overdose, intentional Suicide attempt History of depression Chronic back pain Septic shock- not present on admission UTI-  Citrobacter freindii, Enterococcus faecalis Urinary retention Acute metabolic encephalopathy due to sepsis  AKI Anion gap metabolic acidosis Bowel perforation Acute respiratory failure with hypoxia Acute pulmonary edema Severe protein calorie malnutrition Hypomagnesemia Hypokalemia Hypocalcemia Hyponatremia- present on admission GERD Acute anemia  Brief Hospital Course (including significant findings, care, treatment, and services provided and events leading to death)  [name] is a 76 y.o. year old female with a history of chronic pain, GERD, depression  presented to ED with concern for Norco 5-325 mg overdose. Rx for 100 pills and 20 pills remained --this was reportedly a new Rx. She endorsed taking 80 pills. Required Narcan in the ED  x2. And was placed on a narcan and NAC infusion.   Patient has been more depressed lately, +passive suicidal ideation. Has really been having FTT at home per husband. When asked directly if she is trying to kill herself however she denies.    APAP level 206 - started on NAC  Na 125 K 2.7 iCal 1.13 Mag 1.4   She was managed in the ICU without liver failure acutely and normalization of tylenol level. NAC and  narcan infusions were able to be stopped. She had urinary retention which responded to straight cath. Overnight she had deterioration of her mental status and developed shock. She was treated for empiric withdrawal without improvement. She developed a fever and it became obvious she had septic shock. She was started on empiric antibiotics, given fluids, and given vasopressors. Her mental status decompensated and she required intubation and MV. On post-intubation films she had gastric distention but had additional imaging including an abdominal CT to ensure she did not have a perforated viscus and free air in the abdomen. Surgery was consulted. Throughout the day her mental status improved slightly. Overnight her mental status suddenly worsened and she developed severe abdominal distention. She underwent repeat CT scan and coded shortly after. She had developed a perforated viscus with large volume of free air in the abdomen and extension of air into her vasculature. She developed a cardiac arrest shortly after and was not able to be resuscitated. She will be examined post-mortem by the medical examiner.    Pertinent Labs and Studies  Significant Diagnostic Studies CT ABDOMEN PELVIS WO CONTRAST  Result Date: 07-23-2021 CLINICAL DATA:  Acute abdominal pain EXAM: CT ABDOMEN AND PELVIS WITHOUT CONTRAST TECHNIQUE: Multidetector CT imaging of the abdomen and pelvis was performed following the standard protocol without IV contrast. COMPARISON:  CT from yesterday FINDINGS: Lower chest: Extensive airspace disease in the bilateral lungs. Central line with tip at the lower right atrium, directed towards the coronary sinus. Small pericardial effusion. Gas is present within the anterior aspect of the aorta Hepatobiliary: Extensive portal venous gas filling the liver.Negative gallbladder Pancreas: Generalized atrophy Spleen: Portal venous gas Adrenals/Urinary Tract: Negative adrenals. No hydronephrosis or  stone.  Unremarkable collapsed bladder. Stomach/Bowel: Diffuse pneumatosis and intravascular gas. Colon is dilated to 10 cm with fluid levels and stool. Perforated stomach with enteric tube tip in the subhepatic space, separate from the stomach. Extensive pneumatosis appearance at the level of the lower esophagus. Vascular/Lymphatic: Extensive intra arterial and intravenous gas. There is a collapse of the aorta which contains gas diffusely. No mass or adenopathy. Reproductive:Hysterectomy Other: No ascites or free pneumoperitoneum. Musculoskeletal: No contributory finding Findings called to the ICU at 550. Code in progress on the patient and the dominant findings were relayed to the code team via nursing. IMPRESSION: 1. Interval collapse and diffuse gas filling of the aorta and iliacs with widespread arterial air embolism. 2. There is colonic atony, pneumatosis, and marked portal venous gas. 3. Perforated stomach with enteric tube tip extending into the subhepatic space. 4. Severe pneumonia. Electronically Signed   By: Jorje Guild M.D.   On: 08/05/21 05:53   CT ABDOMEN PELVIS WO CONTRAST  Result Date: 07/15/2021 CLINICAL DATA:  76 year old female with history of sepsis. EXAM: CT ABDOMEN AND PELVIS WITHOUT CONTRAST TECHNIQUE: Multidetector CT imaging of the abdomen and pelvis was performed following the standard protocol without IV contrast. COMPARISON:  CT of the abdomen and pelvis 08/15/2020. FINDINGS: Lower chest: Widespread areas of airspace consolidation scattered throughout all aspects of the visualized lungs. Central venous catheter tip terminating in the right atrium. Nasogastric tube extending into the stomach. Atherosclerotic calcifications in the thoracic aorta as well as the left main, left anterior descending, left circumflex and right coronary arteries. Hepatobiliary: No definite suspicious cystic or solid hepatic lesions are confidently identified on today's noncontrast CT examination. Unenhanced  appearance of the gallbladder is normal. Pancreas: No definite pancreatic mass or peripancreatic fluid collections or inflammatory changes are noted on today's noncontrast CT examination. Spleen: Unremarkable. Adrenals/Urinary Tract: Unenhanced appearance of the kidneys and bilateral adrenal glands is normal. No hydroureteronephrosis. Urinary bladder is completely decompressed around an indwelling Foley balloon catheter. Stomach/Bowel: Nasogastric tube terminates in the mid stomach. Stomach is otherwise normal in appearance. No pathologic dilatation of small bowel or colon. The appendix is not confidently identified and may be surgically absent. Regardless, there are no inflammatory changes noted adjacent to the cecum to suggest the presence of an acute appendicitis at this time. Vascular/Lymphatic: Atherosclerotic calcifications in the abdominal aorta and pelvic vasculature. No lymphadenopathy noted in the abdomen or pelvis. Reproductive: Status post hysterectomy. Ovaries are not confidently identified may be surgically absent or atrophic. Other: Trace volume of ascites and mild diffuse mesenteric edema. No pneumoperitoneum. Musculoskeletal: Postoperative changes of arthrodesis in the right sacroiliac joint are noted. Status post PLIF at L5-S1. Chronic L1 vertebral body compression fracture with 25% loss of anterior vertebral body height and post vertebroplasty changes. There are no aggressive appearing lytic or blastic lesions noted in the visualized portions of the skeleton. IMPRESSION: 1. No acute findings are noted in the abdomen or pelvis. 2. Severe multilobar bilateral pneumonia. 3. Aortic atherosclerosis, in addition to left main and 3 vessel coronary artery disease. Assessment for potential risk factor modification, dietary therapy or pharmacologic therapy may be warranted, if clinically indicated. Electronically Signed   By: Vinnie Langton M.D.   On: 07/15/2021 12:18   DG Abd 1 View  Result Date:  07/15/2021 CLINICAL DATA:  Intentional overdose.  Evaluate for free air. EXAM: ABDOMEN - 1 VIEW COMPARISON:  Earlier today FINDINGS: Stable position of nasogastric tube with tip in the expected location of the antro pyloric  junction. Gaseous distension of the large bowel loops identified. No signs of free intraperitoneal air. Postsurgical changes noted within the lower lumbar spine and sacrum. Mild compression deformity status post vertebroplasty identified at the L1 level. IMPRESSION: 1. No signs of pneumoperitoneum. 2. Stable position of nasogastric tube with tip in the expected location of the antropyloric junction. 3. Gaseous distension of the large bowel loops. Electronically Signed   By: Kerby Moors M.D.   On: 07/15/2021 10:57   DG Abd 1 View  Addendum Date: 07/15/2021   ADDENDUM REPORT: 07/15/2021 09:59 ADDENDUM: Critical Value/emergent results were called by telephone at the time of interpretation on 07/15/2021 at 9:59 am to provider Noemi Chapel , who verbally acknowledged these results. Electronically Signed   By: Kerby Moors M.D.   On: 07/15/2021 09:59   Result Date: 07/15/2021 CLINICAL DATA:  Evaluate OG tube placement. EXAM: ABDOMEN - 1 VIEW COMPARISON:  09/26/2020. FINDINGS: Enteric tube is been placed with tip and side port well below the GE junction. The tip of the tube projects over the distal stomach/antrum. There is diffuse lucency within the upper abdomen for which pneumoperitoneum cannot be excluded. Diffuse bilateral pulmonary infiltrates are identified within the visualized portions of the lungs. IMPRESSION: 1. Enteric tube tip projects over the distal stomach/antrum. 2. Diffuse lucency within the upper abdomen for which pneumoperitoneum cannot be excluded. Recommend further evaluation with right lateral decubitus or cross-table lateral radiograph. 3. Diffuse bilateral pulmonary infiltrates. Electronically Signed: By: Kerby Moors M.D. On: 07/15/2021 09:48   DG CHEST PORT 1  VIEW  Result Date: 07/15/2021 CLINICAL DATA:  76 year old female status post endotracheal tube placement. EXAM: PORTABLE CHEST 1 VIEW COMPARISON:  Chest x-ray 09/26/2020. FINDINGS: An endotracheal tube is in place with tip 1.4 cm above the carina. A nasogastric tube is seen extending into the stomach, however, the tip of the nasogastric tube extends below the lower margin of the image. Right internal jugular central venous catheter with tip terminating in the right atrium. Lung volumes are low. Patchy areas of interstitial prominence an ill-defined airspace disease are noted throughout the lungs bilaterally, most severe throughout the mid to lower lungs. No definite pleural effusions. No pneumothorax. Pulmonary vasculature is obscured. Heart size is normal. Upper mediastinal contours are within normal limits. Atherosclerotic calcifications in the thoracic aorta. IMPRESSION: 1. Support apparatus, as above. 2. The appearance the chest suggests severe multilobar bilateral pneumonia, as above. 3. Aortic atherosclerosis. Electronically Signed   By: Vinnie Langton M.D.   On: 07/15/2021 09:44   ECHOCARDIOGRAM COMPLETE  Result Date: 07/15/2021    ECHOCARDIOGRAM REPORT   Patient Name:   [name] Date of Exam: 07/15/2021 Medical Rec #:  [id number]      Height:       64.0 in Accession #:    [id number]     Weight:       110.9 lb Date of Birth:  [birth year]      BSA:          1.523 m Patient Age:    26 years       BP:           123/60 mmHg Patient Gender: F              HR:           115 bpm. Exam Location:  Inpatient Procedure: 2D Echo, Cardiac Doppler and Color Doppler STAT ECHO Indications:    Abnormal EKG  History:  Patient has no prior history of Echocardiogram examinations.                 Drug overdose. Respiratory failure requiring mechanical                 ventilation.  Sonographer:    Merrie Roof RDCS Referring Phys: [id number] [place]  1. Left ventricular ejection fraction, by  estimation, is 60 to 65%. The left ventricle has normal function. The left ventricle has no regional wall motion abnormalities. Left ventricular diastolic parameters were normal.  2. Right ventricular systolic function is normal. The right ventricular size is normal.  3. The mitral valve is normal in structure. No evidence of mitral valve regurgitation. No evidence of mitral stenosis.  4. The aortic valve is tricuspid. There is mild calcification of the aortic valve. Aortic valve regurgitation is mild. Aortic valve sclerosis is present, with no evidence of aortic valve stenosis.  5. The inferior vena cava is normal in size with greater than 50% respiratory variability, suggesting right atrial pressure of 3 mmHg. FINDINGS  Left Ventricle: Left ventricular ejection fraction, by estimation, is 60 to 65%. The left ventricle has normal function. The left ventricle has no regional wall motion abnormalities. The left ventricular internal cavity size was normal in size. There is  no left ventricular hypertrophy. Left ventricular diastolic parameters were normal. Right Ventricle: The right ventricular size is normal. No increase in right ventricular wall thickness. Right ventricular systolic function is normal. Left Atrium: Left atrial size was normal in size. Right Atrium: Right atrial size was normal in size. Pericardium: There is no evidence of pericardial effusion. Mitral Valve: The mitral valve is normal in structure. No evidence of mitral valve regurgitation. No evidence of mitral valve stenosis. Tricuspid Valve: The tricuspid valve is normal in structure. Tricuspid valve regurgitation is not demonstrated. No evidence of tricuspid stenosis. Aortic Valve: The aortic valve is tricuspid. There is mild calcification of the aortic valve. Aortic valve regurgitation is mild. Aortic valve sclerosis is present, with no evidence of aortic valve stenosis. Aortic valve mean gradient measures 10.0 mmHg.  Aortic valve peak gradient  measures 18.7 mmHg. Aortic valve area, by VTI measures 1.90 cm. Pulmonic Valve: The pulmonic valve was normal in structure. Pulmonic valve regurgitation is not visualized. No evidence of pulmonic stenosis. Aorta: The aortic root is normal in size and structure. Venous: The inferior vena cava is normal in size with greater than 50% respiratory variability, suggesting right atrial pressure of 3 mmHg. IAS/Shunts: No atrial level shunt detected by color flow Doppler.  LEFT VENTRICLE PLAX 2D LVIDd:         3.40 cm   Diastology LVIDs:         2.30 cm   LV e' medial:    10.10 cm/s LV PW:         0.90 cm   LV E/e' medial:  7.5 LV IVS:        0.80 cm   LV e' lateral:   12.80 cm/s LVOT diam:     1.80 cm   LV E/e' lateral: 5.9 LV SV:         57 LV SV Index:   38 LVOT Area:     2.54 cm  RIGHT VENTRICLE RV Basal diam:  2.30 cm LEFT ATRIUM             Index        RIGHT ATRIUM  Index LA diam:        3.20 cm 2.10 cm/m   RA Area:     11.20 cm LA Vol (A2C):   30.8 ml 20.23 ml/m  RA Volume:   21.40 ml  14.05 ml/m LA Vol (A4C):   31.7 ml 20.82 ml/m LA Biplane Vol: 33.5 ml 22.00 ml/m  AORTIC VALVE AV Area (Vmax):    2.01 cm AV Area (Vmean):   1.93 cm AV Area (VTI):     1.90 cm AV Vmax:           216.00 cm/s AV Vmean:          146.000 cm/s AV VTI:            0.301 m AV Peak Grad:      18.7 mmHg AV Mean Grad:      10.0 mmHg LVOT Vmax:         171.00 cm/s LVOT Vmean:        111.000 cm/s LVOT VTI:          0.225 m LVOT/AV VTI ratio: 0.75  AORTA Ao Root diam: 2.90 cm Ao Asc diam:  3.20 cm MITRAL VALVE MV Area (PHT): 5.02 cm     SHUNTS MV Decel Time: 151 msec     Systemic VTI:  0.22 m MV E velocity: 75.80 cm/s   Systemic Diam: 1.80 cm MV A velocity: 108.00 cm/s MV E/A ratio:  0.70 Jenkins Rouge MD Electronically signed by Jenkins Rouge MD Signature Date/Time: 07/15/2021/11:20:21 AM    Final    CT HEAD CODE STROKE WO CONTRAST  Result Date: 07/24/2021 CLINICAL DATA:  Code stroke. Neuro deficit with acute stroke suspected  EXAM: CT HEAD WITHOUT CONTRAST TECHNIQUE: Contiguous axial images were obtained from the base of the skull through the vertex without intravenous contrast. COMPARISON:  12/21/2020 FINDINGS: Brain: No acute hemorrhage. Linear gas over the right cerebral convexity which is sulcal/intravascular. No visible superimposed infarct. No hemorrhage, hydrocephalus, or masslike finding. Vascular: As above Skull: Negative Sinuses/Orbits: Negative. Gas in the soft tissues of the skull base, likely intravenous. IMPRESSION: 1. Subarachnoid/intravascular gas at the right vertex, likely arterial air embolism given abdominal CT findings. 2. No hemorrhage or detectable infarct. Electronically Signed   By: Jorje Guild M.D.   On: 2021-07-24 05:54    Microbiology Recent Results (from the past 240 hour(s))  Resp Panel by RT-PCR (Flu A&B, Covid) Nasopharyngeal Swab     Status: None   Collection Time: 07/04/2021 12:45 PM   Specimen: Nasopharyngeal Swab; Nasopharyngeal(NP) swabs in vial transport medium  Result Value Ref Range Status   SARS Coronavirus 2 by RT PCR NEGATIVE NEGATIVE Final    Comment: (NOTE) SARS-CoV-2 target nucleic acids are NOT DETECTED.  The SARS-CoV-2 RNA is generally detectable in upper respiratory specimens during the acute phase of infection. The lowest concentration of SARS-CoV-2 viral copies this assay can detect is 138 copies/mL. A negative result does not preclude SARS-Cov-2 infection and should not be used as the sole basis for treatment or other patient management decisions. A negative result may occur with  improper specimen collection/handling, submission of specimen other than nasopharyngeal swab, presence of viral mutation(s) within the areas targeted by this assay, and inadequate number of viral copies(<138 copies/mL). A negative result must be combined with clinical observations, patient history, and epidemiological information. The expected result is Negative.  Fact Sheet for  Patients:  EntrepreneurPulse.com.au  Fact Sheet for Healthcare Providers:  IncredibleEmployment.be  This test is no t  yet approved or cleared by the [place] and  has been authorized for detection and/or diagnosis of SARS-CoV-2 by FDA under an Emergency Use Authorization (EUA). This EUA will remain  in effect (meaning this test can be used) for the duration of the COVID-19 declaration under Section 564(b)(1) of the Act, 21 U.S.C.section 360bbb-3(b)(1), unless the authorization is terminated  or revoked sooner.       Influenza A by PCR NEGATIVE NEGATIVE Final   Influenza B by PCR NEGATIVE NEGATIVE Final    Comment: (NOTE) The Xpert Xpress SARS-CoV-2/FLU/RSV plus assay is intended as an aid in the diagnosis of influenza from Nasopharyngeal swab specimens and should not be used as a sole basis for treatment. Nasal washings and aspirates are unacceptable for Xpert Xpress SARS-CoV-2/FLU/RSV testing.  Fact Sheet for Patients: EntrepreneurPulse.com.au  Fact Sheet for Healthcare Providers: IncredibleEmployment.be  This test is not yet approved or cleared by the [place] FDA and has been authorized for detection and/or diagnosis of SARS-CoV-2 by FDA under an Emergency Use Authorization (EUA). This EUA will remain in effect (meaning this test can be used) for the duration of the COVID-19 declaration under Section 564(b)(1) of the Act, 21 U.S.C. section 360bbb-3(b)(1), unless the authorization is terminated or revoked.  Performed at [facility], [place] [address], [place], [place] [address]   MRSA Next Gen by PCR, Nasal     Status: None   Collection Time: 06/27/2021  9:00 PM   Specimen: Nasal Mucosa; Nasal Swab  Result Value Ref Range Status   MRSA by PCR Next Gen NOT DETECTED NOT DETECTED Final    Comment: (NOTE) The GeneXpert MRSA Assay (FDA approved for NASAL specimens only), is one  component of a comprehensive MRSA colonization surveillance program. It is not intended to diagnose MRSA infection nor to guide or monitor treatment for MRSA infections. Test performance is not FDA approved in patients less than 70 years old. Performed at [facility], [place] [address] [place], [place] [address]   Culture, blood (routine x 2)     Status: None (Preliminary result)   Collection Time: 07/15/21  8:40 AM   Specimen: BLOOD  Result Value Ref Range Status   Specimen Description BLOOD SITE NOT SPECIFIED  Final   Special Requests   Final    BOTTLES DRAWN AEROBIC AND ANAEROBIC Blood Culture adequate volume   Culture   Final    NO GROWTH 2 DAYS Performed at [facility], [place] [address] [place], [place] [address]    Report Status PENDING  Incomplete  Culture, blood (routine x 2)     Status: None (Preliminary result)   Collection Time: 07/15/21  9:28 AM   Specimen: BLOOD RIGHT HAND  Result Value Ref Range Status   Specimen Description BLOOD RIGHT HAND  Final   Special Requests   Final    BOTTLES DRAWN AEROBIC AND ANAEROBIC Blood Culture adequate volume   Culture   Final    NO GROWTH 2 DAYS Performed at [facility], [place] [address], [place], [place] [address]    Report Status PENDING  Incomplete  Urine Culture     Status: Abnormal   Collection Time: 07/15/21 10:48 AM   Specimen: Urine, Catheterized  Result Value Ref Range Status   Specimen Description URINE, CATHETERIZED  Final   Special Requests   Final    NONE Performed at [facility], [place] [address], [place], [place] [address]  Culture (A)  Final    30,000 COLONIES/mL CITROBACTER FREUNDII 40,000 COLONIES/mL ENTEROCOCCUS FAECALIS    Report Status 07/17/2021 FINAL  Final   Organism ID, Bacteria CITROBACTER FREUNDII (A)  Final   Organism ID, Bacteria ENTEROCOCCUS FAECALIS (A)  Final      Susceptibility   Citrobacter freundii - MIC*    CEFAZOLIN >=64 RESISTANT Resistant     CEFEPIME <=0.12  SENSITIVE Sensitive     CEFTRIAXONE <=0.25 SENSITIVE Sensitive     CIPROFLOXACIN <=0.25 SENSITIVE Sensitive     GENTAMICIN <=1 SENSITIVE Sensitive     IMIPENEM 0.5 SENSITIVE Sensitive     NITROFURANTOIN <=16 SENSITIVE Sensitive     TRIMETH/SULFA <=20 SENSITIVE Sensitive     PIP/TAZO <=4 SENSITIVE Sensitive     * 30,000 COLONIES/mL CITROBACTER FREUNDII   Enterococcus faecalis - MIC*    AMPICILLIN <=2 SENSITIVE Sensitive     NITROFURANTOIN <=16 SENSITIVE Sensitive     VANCOMYCIN 1 SENSITIVE Sensitive     * 40,000 COLONIES/mL ENTEROCOCCUS FAECALIS    Lab Basic Metabolic Panel: Recent Labs  Lab 07/14/2021 1245 06/15/2021 1410 07/14/21 0535 07/14/21 1748 07/15/21 0150 07/15/21 1045 07/15/21 1422 07-19-2021 0307 07/19/2021 0437 2021-07-19 0446  NA 125*   < > 128* 129* 127* 133* 132* 138 139  --   K 2.7*   < > 3.0* 3.9 2.9* 3.9 3.5 4.3 4.3  --   CL 87*  --  96* 101 100  --  104 104  --   --   CO2 24  --  19* 16* 15*  --  19* 22  --   --   GLUCOSE 111*  --  117* 147* 150*  --  130* 308*  --   --   BUN 12  --  9 17 31*  --  27* 25*  --   --   CREATININE 0.78  --  0.66 1.26* 2.04*  --  1.04* 1.10*  --   --   CALCIUM 9.3  --  9.0 8.7* 8.2*  --  8.2* 8.9  --   --   MG 1.4*  --   --   --  1.8  --  2.6* 8.2*  --  6.9*  PHOS  --   --   --   --  <1.0*  --  4.5 3.4  --  3.3   < > = values in this interval not displayed.   Liver Function Tests: Recent Labs  Lab 07/14/21 0535 07/14/21 0943 07/14/21 1748 07/15/21 0150 07/15/21 1422  AST 27 29 29 28 29   ALT 14 15 14 13 14   ALKPHOS 80 75 72 82 82  BILITOT 1.1 0.8 0.6 0.4 0.6  PROT 6.7 6.7 6.2* 5.2* 4.5*  ALBUMIN 3.2* 3.0* 2.8* 2.4* 2.0*   No results for input(s): LIPASE, AMYLASE in the last 168 hours. No results for input(s): AMMONIA in the last 168 hours. CBC: Recent Labs  Lab 06/16/2021 1245 07/04/2021 1410 07/14/21 0535 07/15/21 0150 07/15/21 1045 07/15/21 1422 2021-07-19 0307 07-19-2021 0437  WBC 8.1  --  15.6* 13.6*  --  14.5*  14.7*  --   NEUTROABS 6.9  --   --   --   --   --   --   --   HGB 11.9*   < > 13.4 10.7* 9.2* 8.9* 11.6* 10.9*  HCT 34.9*   < > 39.0 31.2* 27.0* 25.7* 35.7* 32.0*  MCV 81.4  --  80.1 79.6*  --  81.6 84.6  --   PLT 204  --  239 162  --  141* 142*  --    < > = values in this interval not displayed.   Cardiac Enzymes: No results for input(s): CKTOTAL, CKMB, CKMBINDEX, TROPONINI in the last 168 hours. Sepsis Labs: Recent Labs  Lab 07/14/21 0535 07/15/21 0150 07/15/21 1422 August 04, 2021 0307 08-04-2021 0458  WBC 15.6* 13.6* 14.5* 14.7*  --   LATICACIDVEN  --   --  1.4  --  >9.0*    Procedures/Operations  Intubation CVC    Julian Hy 07/17/2021, 9:00 AM

## 2021-08-16 NOTE — Progress Notes (Signed)
PATIENT NAME: [name] MEDICAL RECORD NUMBER: [id number] Birthday: 01-23-1946  Age: 76 y.o. Admit Date: 08/04/2021  Attending Provider: Dr. Chestine Spore  Indication: PEA arrest  Technical Description:  CPR performance duration: 26 minutes Was defibrillation or cardioversion used ? yes Was external pacer placed ? no Was patient intubated pre/post CPR ? Already intubated pre cpr Was transvenous pacer placed ? no  Medications Administered Include      Yes/no Amiodarone   Atropin   Calcium yes  Epinephrine yes  Lidocaine   Magnesium   Norepinephrine yes  Phenylephrine   Sodium bicarbonate yes  Vasopression   Dextrose                              yes  Evaluation Final Status - Was patient successfully resuscitated ? No; time of death 0609 am If successfully resuscitated - what is current rhythm ? N/A If successfully resuscitated - what is current hemodynamic status ? N/A  Miscellaneous Information After receiving CT head and CT abdomen, upon return to ICU patient lost pulse and had PEA arrest at 5:43 AM.  CPR was started.  ACLS protocol meds given.  Bicarb given.  CBG 34; amp of dextrose given.  Patient shocked intermittently with V. fib rhythm.  Critical results called from radiology from CT abdomen showing diffuse pneumatosis of the colon, perforation of the stomach, and diffuse gas filling of the aorta and iliacs.  Despite aggressive CPR and ACLS was unable to obtain ROSC.  Time of death 6:09 AM.  Contacted husband [name] and updated over phone.  Due to patient's drug overdose will likely be candidate for an ME case.  Lidia Collum 1/1/20236:25 AM

## 2021-08-16 DEATH — deceased
